# Patient Record
Sex: Female | Born: 1990 | Race: Black or African American | Hispanic: No | Marital: Single | Smoking: Never smoker
Health system: Southern US, Community
[De-identification: ages and names within clinical notes are randomized; demographics above are authoritative.]

## PROBLEM LIST (undated history)

## (undated) DIAGNOSIS — K8020 Calculus of gallbladder without cholecystitis without obstruction: Secondary | ICD-10-CM

## (undated) DIAGNOSIS — E876 Hypokalemia: Secondary | ICD-10-CM

## (undated) HISTORY — PX: CHOLECYSTECTOMY: SHX55

---

## 2014-11-26 ENCOUNTER — Encounter (HOSPITAL_COMMUNITY): Payer: Self-pay

## 2014-11-26 ENCOUNTER — Emergency Department (HOSPITAL_COMMUNITY)
Admission: EM | Admit: 2014-11-26 | Discharge: 2014-11-26 | Disposition: A | Discharge status: Home / Self Care | Payer: Self-pay | Attending: Emergency Medicine | Admitting: Emergency Medicine

## 2014-11-26 DIAGNOSIS — X58XXXA Exposure to other specified factors, initial encounter: Secondary | ICD-10-CM | POA: Insufficient documentation

## 2014-11-26 DIAGNOSIS — Z3202 Encounter for pregnancy test, result negative: Secondary | ICD-10-CM | POA: Insufficient documentation

## 2014-11-26 DIAGNOSIS — Y998 Other external cause status: Secondary | ICD-10-CM | POA: Insufficient documentation

## 2014-11-26 DIAGNOSIS — Z720 Tobacco use: Secondary | ICD-10-CM | POA: Insufficient documentation

## 2014-11-26 DIAGNOSIS — S90822A Blister (nonthermal), left foot, initial encounter: Secondary | ICD-10-CM | POA: Insufficient documentation

## 2014-11-26 DIAGNOSIS — N390 Urinary tract infection, site not specified: Secondary | ICD-10-CM | POA: Insufficient documentation

## 2014-11-26 DIAGNOSIS — E876 Hypokalemia: Secondary | ICD-10-CM | POA: Insufficient documentation

## 2014-11-26 DIAGNOSIS — Y939 Activity, unspecified: Secondary | ICD-10-CM | POA: Insufficient documentation

## 2014-11-26 DIAGNOSIS — Y929 Unspecified place or not applicable: Secondary | ICD-10-CM | POA: Insufficient documentation

## 2014-11-26 DIAGNOSIS — A5903 Trichomonal cystitis and urethritis: Secondary | ICD-10-CM | POA: Insufficient documentation

## 2014-11-26 HISTORY — DX: Hypokalemia: E87.6

## 2014-11-26 LAB — I-STAT CHEM 8, ED
BUN: 6 mg/dL (ref 6–23)
Calcium, Ion: 1.21 mmol/L (ref 1.12–1.23)
Chloride: 102 mmol/L (ref 96–112)
Creatinine, Ser: 0.5 mg/dL (ref 0.50–1.10)
Glucose, Bld: 84 mg/dL (ref 70–99)
HCT: 41 % (ref 36.0–46.0)
Hemoglobin: 13.9 g/dL (ref 12.0–15.0)
Potassium: 3 mmol/L — ABNORMAL LOW (ref 3.5–5.1)
SODIUM: 142 mmol/L (ref 135–145)
TCO2: 26 mmol/L (ref 0–100)

## 2014-11-26 LAB — URINALYSIS, ROUTINE W REFLEX MICROSCOPIC
BILIRUBIN URINE: NEGATIVE
Glucose, UA: NEGATIVE mg/dL
HGB URINE DIPSTICK: NEGATIVE
KETONES UR: NEGATIVE mg/dL
Nitrite: NEGATIVE
Protein, ur: NEGATIVE mg/dL
SPECIFIC GRAVITY, URINE: 1.01 (ref 1.005–1.030)
Urobilinogen, UA: 1 mg/dL (ref 0.0–1.0)
pH: 7.5 (ref 5.0–8.0)

## 2014-11-26 LAB — URINE MICROSCOPIC-ADD ON

## 2014-11-26 LAB — POC URINE PREG, ED: Preg Test, Ur: NEGATIVE

## 2014-11-26 MED ORDER — POTASSIUM CHLORIDE ER 10 MEQ PO TBCR: 10.0000 meq | EXTENDED_RELEASE_TABLET | Freq: Two times a day (BID) | ORAL | Status: DC

## 2014-11-26 MED ORDER — POTASSIUM CHLORIDE CRYS ER 20 MEQ PO TBCR
40.0000 meq | EXTENDED_RELEASE_TABLET | Freq: Once | ORAL | Status: AC
  Administered 2014-11-26: 40 meq via ORAL
  Filled 2014-11-26: qty 2

## 2014-11-26 MED ORDER — CEPHALEXIN 500 MG PO CAPS
500.0000 mg | ORAL_CAPSULE | Freq: Four times a day (QID) | ORAL | Status: DC
Start: 2014-11-26 — End: 2015-07-27

## 2014-11-26 MED ORDER — METRONIDAZOLE 500 MG PO TABS
2000.0000 mg | ORAL_TABLET | Freq: Once | ORAL | Status: AC
  Administered 2014-11-26: 2000 mg via ORAL
  Filled 2014-11-26: qty 4

## 2014-11-26 NOTE — Discharge Instructions (Signed)
Use post op boot when walking.  Recheck with foot doctor next week.Urinary Tract Infection A urinary tract infection (UTI) can occur any place along the urinary tract. The tract includes the kidneys, ureters, bladder, and urethra. A type of germ called bacteria often causes a UTI. UTIs are often helped with antibiotic medicine.  HOME CARE   If given, take antibiotics as told by your doctor. Finish them even if you start to feel better.  Drink enough fluids to keep your pee (urine) clear or pale yellow.  Avoid tea, drinks with caffeine, and bubbly (carbonated) drinks.  Pee often. Avoid holding your pee in for a long time.  Pee before and after having sex (intercourse).  Wipe from front to back after you poop (bowel movement) if you are a woman. Use each tissue only once. GET HELP RIGHT AWAY IF:   You have back pain.  You have lower belly (abdominal) pain.  You have chills.  You feel sick to your stomach (nauseous).  You throw up (vomit).  Your burning or discomfort with peeing does not go away.  You have a fever.  Your symptoms are not better in 3 days. MAKE SURE YOU:   Understand these instructions.  Will watch your condition.  Will get help right away if you are not doing well or get worse. Document Released: 01/15/2008 Document Revised: 04/22/2012 Document Reviewed: 02/27/2012 [facility] Patient Information 2015 [place], [place]. This information is not intended to replace advice given to you by your health care provider. Make sure you discuss any questions you have with your health care provider. Trichomoniasis Trichomoniasis is an infection caused by an organism called Trichomonas. The infection can affect both women and men. In women, the outer female genitalia and the vagina are affected. In men, the penis is mainly affected, but the prostate and other reproductive organs can also be involved. Trichomoniasis is a sexually transmitted infection (STI) and is most often  passed to another person through sexual contact.  RISK FACTORS  Having unprotected sexual intercourse.  Having sexual intercourse with an infected partner. SIGNS AND SYMPTOMS  Symptoms of trichomoniasis in women include:  Abnormal gray-green frothy vaginal discharge.  Itching and irritation of the vagina.  Itching and irritation of the area outside the vagina. Symptoms of trichomoniasis in men include:   Penile discharge with or without pain.  Pain during urination. This results from inflammation of the urethra. DIAGNOSIS  Trichomoniasis may be found during a Pap test or physical exam. Your health care provider may use one of the following methods to help diagnose this infection:  Examining vaginal discharge under a microscope. For men, urethral discharge would be examined.  Testing the pH of the vagina with a test tape.  Using a vaginal swab test that checks for the Trichomonas organism. A test is available that provides results within a few minutes.  Doing a culture test for the organism. This is not usually needed. TREATMENT   You may be given medicine to fight the infection. Women should inform their health care provider if they could be or are pregnant. Some medicines used to treat the infection should not be taken during pregnancy.  Your health care provider may recommend over-the-counter medicines or creams to decrease itching or irritation.  Your sexual partner will need to be treated if infected. HOME CARE INSTRUCTIONS   Take medicines only as directed by your health care provider.  Take over-the-counter medicine for itching or irritation as directed by your health care provider.  Do not have sexual intercourse while you have the infection.  Women should not douche or wear tampons while they have the infection.  Discuss your infection with your partner. Your partner may have gotten the infection from you, or you may have gotten it from your partner.  Have your  sex partner get examined and treated if necessary.  Practice safe, informed, and protected sex.  See your health care provider for other STI testing. SEEK MEDICAL CARE IF:   You still have symptoms after you finish your medicine.  You develop abdominal pain.  You have pain when you urinate.  You have bleeding after sexual intercourse.  You develop a rash.  Your medicine makes you sick or makes you throw up (vomit). MAKE SURE YOU:  Understand these instructions.  Will watch your condition.  Will get help right away if you are not doing well or get worse. Document Released: 01/22/2001 Document Revised: 12/13/2013 Document Reviewed: 05/10/2013 [facility] Patient Information 2015 [place], [place]. This information is not intended to replace advice given to you by your health care provider. Make sure you discuss any questions you have with your health care provider. Hypokalemia Hypokalemia means that the amount of potassium in the blood is lower than normal.Potassium is a chemical, called an electrolyte, that helps regulate the amount of fluid in the body. It also stimulates muscle contraction and helps nerves function properly.Most of the body's potassium is inside of cells, and only a very small amount is in the blood. Because the amount in the blood is so small, minor changes can be life-threatening. CAUSES  Antibiotics.  Diarrhea or vomiting.  Using laxatives too much, which can cause diarrhea.  Chronic kidney disease.  Water pills (diuretics).  Eating disorders (bulimia).  Low magnesium level.  Sweating a lot. SIGNS AND SYMPTOMS  Weakness.  Constipation.  Fatigue.  Muscle cramps.  Mental confusion.  Skipped heartbeats or irregular heartbeat (palpitations).  Tingling or numbness. DIAGNOSIS  Your health care provider can diagnose hypokalemia with blood tests. In addition to checking your potassium level, your health care provider may also check other lab  tests. TREATMENT Hypokalemia can be treated with potassium supplements taken by mouth or adjustments in your current medicines. If your potassium level is very low, you may need to get potassium through a vein (IV) and be monitored in the hospital. A diet high in potassium is also helpful. Foods high in potassium are:  Nuts, such as peanuts and pistachios.  Seeds, such as sunflower seeds and pumpkin seeds.  Peas, lentils, and lima beans.  Whole grain and bran cereals and breads.  Fresh fruit and vegetables, such as apricots, avocado, bananas, cantaloupe, kiwi, oranges, tomatoes, asparagus, and potatoes.  Orange and tomato juices.  Red meats.  Fruit yogurt. HOME CARE INSTRUCTIONS  Take all medicines as prescribed by your health care provider.  Maintain a healthy diet by including nutritious food, such as fruits, vegetables, nuts, whole grains, and lean meats.  If you are taking a laxative, be sure to follow the directions on the label. SEEK MEDICAL CARE IF:  Your weakness gets worse.  You feel your heart pounding or racing.  You are vomiting or having diarrhea.  You are diabetic and having trouble keeping your blood glucose in the normal range. SEEK IMMEDIATE MEDICAL CARE IF:  You have chest pain, shortness of breath, or dizziness.  You are vomiting or having diarrhea for more than 2 days.  You faint. MAKE SURE YOU:   Understand these  instructions.  Will watch your condition.  Will get help right away if you are not doing well or get worse. Document Released: 07/29/2005 Document Revised: 05/19/2013 Document Reviewed: 01/29/2013 [facility] Patient Information 2015 [place]. This information is not intended to replace advice given to you by your health care provider. Make sure you discuss any questions you have with your health care provider.

## 2014-11-26 NOTE — ED Notes (Addendum)
Pt states has been short of breath while working and dizziness for weeks-- intermittently-- "when I am overdoing it" . Pt states has not been eating good lately-- has lost weight in past month-- 3 pounds.

## 2014-11-26 NOTE — ED Provider Notes (Signed)
CSN: [id number]     Arrival date & time 11/26/14  1335 History   First MD Initiated Contact with Patient 11/26/14 1413     Chief Complaint  Patient presents with  . Urinary Frequency  . Foot Pain     (Consider location/radiation/quality/duration/timing/severity/associated sxs/prior Treatment) HPI 24 year old female who comes in today complaining of pain on the bottom of her left foot. She states there's been some swelling and has gradually gotten larger over the past year. She is having pain when she stands on it. She did not have any injury or wound to this area. She may have noted some discharge from the area several months ago but has not noted any recently. She's not had any similar symptoms in the past. Nursing notes state that she has been short of breath while working but she denies this to me. She has had some increased frequency of urination and urinalysis has been pending. Any local primary care. She states that she needs a note for work as she missed work last night. Past Medical History  Diagnosis Date  . Low blood potassium    Past Surgical History  Procedure Laterality Date  . Cholecystectomy     History reviewed. No pertinent family history. History  Substance Use Topics  . Smoking status: Current Every Day Smoker -- 1.00 packs/day    Types: Cigarettes  . Smokeless tobacco: Not on file  . Alcohol Use: No   OB History    No data available     Review of Systems  Constitutional: Negative for appetite change.  HENT: Negative.   Eyes: Negative.   Respiratory: Negative.   Cardiovascular: Negative.   Gastrointestinal: Negative.   Endocrine: Negative.   Genitourinary: Positive for frequency.  Musculoskeletal: Positive for gait problem.  Neurological: Negative.   Hematological: Negative.   Psychiatric/Behavioral: Negative.   All other systems reviewed and are negative.     Allergies  Benadryl  Home Medications   Prior to Admission medications   Not on  File   BP 97/61 mmHg  Pulse 80  Temp(Src) 98.4 F (36.9 C) (Oral)  Resp 16  Ht 5' (1.524 m)  Wt 92 lb 12.8 oz (42.094 kg)  BMI 18.12 kg/m2  SpO2 100%  LMP 11/20/2014 Physical Exam  Constitutional: She appears well-developed and well-nourished.  HENT:  Head: Normocephalic and atraumatic.  Neck: Normal range of motion. Neck supple.  Pulmonary/Chest: Effort normal.  Musculoskeletal: Normal range of motion. She exhibits tenderness. She exhibits no edema.       Feet:  Patient with 3x3 cm area swelling plantar surface left foot.   Nursing note and vitals reviewed.   ED Course  Procedures (including critical care time) Labs Review Labs Reviewed  URINALYSIS, ROUTINE W REFLEX MICROSCOPIC - Abnormal; Notable for the following:    APPearance CLOUDY (*)    Leukocytes, UA LARGE (*)    All other components within normal limits  URINE MICROSCOPIC-ADD ON - Abnormal; Notable for the following:    Squamous Epithelial / LPF FEW (*)    Bacteria, UA FEW (*)    All other components within normal limits  I-STAT CHEM 8, ED - Abnormal; Notable for the following:    Potassium 3.0 (*)    All other components within normal limits  POC URINE PREG, ED    I  MDM   Final diagnoses:  Blister of plantar aspect of foot, left, initial encounter  Hypokalemia  UTI (lower urinary tract infection)  Trichomonal cystitis  Left foot swelling- very gradual, doubt fb or abscess.  Possibly plantar wart.  Plan referral to podiatry.   Hypokalemia- oral repletion ordered here in ed and will give rx with referral for op f/u  3- uti- plan abx and will also treat trich.     Margarita Grizzleanielle Demarkis Gheen, MD 11/27/14 [phone]

## 2014-11-26 NOTE — ED Notes (Addendum)
Onset several days increased urination, shortness of breath and dizziness.   Pt reports gets these symptoms  when potassium low.  No dizziness or shortness of breath at this time.   Onset 2 years lump on bottom of left foot, to date has not had evaluated. NAD.

## 2015-01-20 ENCOUNTER — Encounter (HOSPITAL_COMMUNITY): Payer: Self-pay

## 2015-01-20 ENCOUNTER — Emergency Department (HOSPITAL_BASED_OUTPATIENT_CLINIC_OR_DEPARTMENT_OTHER): Payer: Self-pay

## 2015-01-20 ENCOUNTER — Emergency Department (HOSPITAL_COMMUNITY)
Admission: EM | Admit: 2015-01-20 | Discharge: 2015-01-20 | Disposition: A | Discharge status: Home / Self Care | Payer: Self-pay | Attending: Emergency Medicine | Admitting: Emergency Medicine

## 2015-01-20 DIAGNOSIS — M25561 Pain in right knee: Secondary | ICD-10-CM

## 2015-01-20 DIAGNOSIS — L0291 Cutaneous abscess, unspecified: Secondary | ICD-10-CM

## 2015-01-20 DIAGNOSIS — M79609 Pain in unspecified limb: Secondary | ICD-10-CM

## 2015-01-20 DIAGNOSIS — L02415 Cutaneous abscess of right lower limb: Secondary | ICD-10-CM | POA: Insufficient documentation

## 2015-01-20 DIAGNOSIS — Z792 Long term (current) use of antibiotics: Secondary | ICD-10-CM | POA: Insufficient documentation

## 2015-01-20 DIAGNOSIS — Z8639 Personal history of other endocrine, nutritional and metabolic disease: Secondary | ICD-10-CM | POA: Insufficient documentation

## 2015-01-20 DIAGNOSIS — Z720 Tobacco use: Secondary | ICD-10-CM | POA: Insufficient documentation

## 2015-01-20 LAB — BASIC METABOLIC PANEL
Anion gap: 10 (ref 5–15)
BUN: <5 mg/dL — ABNORMAL LOW (ref 6–20)
CO2: 23 mmol/L (ref 22–32)
Calcium: 8.9 mg/dL (ref 8.9–10.3)
Chloride: 104 mmol/L (ref 101–111)
Creatinine, Ser: 0.39 mg/dL — ABNORMAL LOW (ref 0.44–1.00)
GFR calc Af Amer: >60 mL/min (ref >60)
GFR calc non Af Amer: >60 mL/min (ref >60)
Glucose, Bld: 96 mg/dL (ref 65–99)
Potassium: 2.5 mmol/L — CL (ref 3.5–5.1)
Sodium: 137 mmol/L (ref 135–145)

## 2015-01-20 LAB — CBC WITH DIFFERENTIAL/PLATELET
BASOS ABS: 0.1 10*3/uL (ref 0.0–0.1)
BASOS PCT: 1 % (ref 0–1)
EOS PCT: 1 % (ref 0–5)
Eosinophils Absolute: 0 10*3/uL (ref 0.0–0.7)
HEMATOCRIT: 34 % — AB (ref 36.0–46.0)
Hemoglobin: 12 g/dL (ref 12.0–15.0)
Lymphocytes Relative: 30 % (ref 12–46)
Lymphs Abs: 2.2 10*3/uL (ref 0.7–4.0)
MCH: 27.6 pg (ref 26.0–34.0)
MCHC: 35.3 g/dL (ref 30.0–36.0)
MCV: 78.2 fL (ref 78.0–100.0)
MONOS PCT: 8 % (ref 3–12)
Monocytes Absolute: 0.6 10*3/uL (ref 0.1–1.0)
NEUTROS ABS: 4.3 10*3/uL (ref 1.7–7.7)
Neutrophils Relative %: 60 % (ref 43–77)
PLATELETS: 271 10*3/uL (ref 150–400)
RBC: 4.35 MIL/uL (ref 3.87–5.11)
RDW: 14.7 % (ref 11.5–15.5)
WBC: 7.2 10*3/uL (ref 4.0–10.5)

## 2015-01-20 MED ORDER — POTASSIUM CHLORIDE CRYS ER 20 MEQ PO TBCR
20.0000 meq | EXTENDED_RELEASE_TABLET | Freq: Once | ORAL | Status: AC
  Administered 2015-01-20: 20 meq via ORAL
  Filled 2015-01-20: qty 1

## 2015-01-20 MED ORDER — DOXYCYCLINE HYCLATE 100 MG PO CAPS: 100.0000 mg | ORAL_CAPSULE | Freq: Two times a day (BID) | ORAL | Status: DC

## 2015-01-20 MED ORDER — KETOROLAC TROMETHAMINE 30 MG/ML IJ SOLN
30.0000 mg | Freq: Once | INTRAMUSCULAR | Status: AC
  Administered 2015-01-20: 30 mg via INTRAVENOUS
  Filled 2015-01-20: qty 1

## 2015-01-20 MED ORDER — POTASSIUM CHLORIDE ER 10 MEQ PO TBCR: 10.0000 meq | EXTENDED_RELEASE_TABLET | Freq: Every day | ORAL | Status: DC

## 2015-01-20 MED ORDER — NAPROXEN 500 MG PO TABS
500.0000 mg | ORAL_TABLET | Freq: Two times a day (BID) | ORAL | Status: DC
Start: 2015-01-20 — End: 2017-10-13

## 2015-01-20 NOTE — ED Notes (Signed)
Pt complaining of a "keloid behind knee." Pt states "I've had it all my life but have never had any trouble with it." Complaining of increased pain over last few days. Pt ambulatory.

## 2015-01-20 NOTE — Discharge Instructions (Signed)
Abscess Take anabiotic's as prescribed. Please return for fever, surrounding redness, no improvement in 48 hours. Follow-up with podiatry for skin lesion on the bottom of your right foot.  An abscess (boil or furuncle) is an infected area on or under the skin. This area is filled with yellowish-white fluid (pus) and other material (debris). HOME CARE   Only take medicines as told by your doctor.  If you were given antibiotic medicine, take it as directed. Finish the medicine even if you start to feel better.  If gauze is used, follow your doctor's directions for changing the gauze.  To avoid spreading the infection:  Keep your abscess covered with a bandage.  Wash your hands well.  Do not share personal care items, towels, or whirlpools with others.  Avoid skin contact with others.  Keep your skin and clothes clean around the abscess.  Keep all doctor visits as told. GET HELP RIGHT AWAY IF:   You have more pain, puffiness (swelling), or redness in the wound site.  You have more fluid or blood coming from the wound site.  You have muscle aches, chills, or you feel sick.  You have a fever. MAKE SURE YOU:   Understand these instructions.  Will watch your condition.  Will get help right away if you are not doing well or get worse. Document Released: 01/15/2008 Document Revised: 01/28/2012 Document Reviewed: 10/11/2011 [facility] Patient Information 2015 [place], [place]. This information is not intended to replace advice given to you by your health care provider. Make sure you discuss any questions you have with your health care provider.  Emergency Department Resource Guide 1) Find a Doctor and Pay Out of Pocket Although you won't have to find out who is covered by your insurance plan, it is a good idea to ask around and get recommendations. You will then need to call the office and see if the doctor you have chosen will accept you as a new patient and what types of options  they offer for patients who are self-pay. Some doctors offer discounts or will set up payment plans for their patients who do not have insurance, but you will need to ask so you aren't surprised when you get to your appointment.  2) Contact Your Local Health Department Not all health departments have doctors that can see patients for sick visits, but many do, so it is worth a call to see if yours does. If you don't know where your local health department is, you can check in your phone book. The CDC also has a tool to help you locate your state's health department, and many state websites also have listings of all of their local health departments.  3) Find a Walk-in Clinic If your illness is not likely to be very severe or complicated, you may want to try a walk in clinic. These are popping up all over the country in pharmacies, drugstores, and shopping centers. They're usually staffed by nurse practitioners or physician assistants that have been trained to treat common illnesses and complaints. They're usually fairly quick and inexpensive. However, if you have serious medical issues or chronic medical problems, these are probably not your best option.  No Primary Care Doctor: - Call Health Connect at  [phone] - they can help you locate a primary care doctor that  accepts your insurance, provides certain services, etc. - Physician Referral Service- [phone]  Chronic Pain Problems: Organization         Address  Phone  Notes  [facility]  [phone] Patients need to be referred by their primary care doctor.   Medication Assistance: Organization         Address  Phone   Notes  [facility] Medication [facility] [address] [place] [place], [place] [address] [phone] --Must be a resident of [facility] -- Must have NO insurance coverage whatsoever (no Medicaid/ Medicare, etc.) -- The pt. MUST have a primary care doctor that directs their  care regularly and follows them in the community   [facility]ist  [phone]   [employer]  [phone]    Agencies that provide inexpensive medical care: Organization         Address  Phone   Notes  [facility]  [phone]   [facility]    [phone]   [facility] [address] [place], [place] [address] [phone]   [facility] [address] [place] [place] [phone]   Planned Parenthood    [phone]   [facility]    [phone]   Community Health and [facility]  [address], [place] Phone:  [phone], Fax:  [phone] Hours of Operation:  9 am - 6 pm, M-F.  Also accepts Medicaid/Medicare and self-pay.  [facility]  [address], [place] Phone: [phone], Fax: [phone]. Hours of Operation:  8:30 am - 5:30 pm, M-F.  Also accepts Medicaid and self-pay.  [facility] [place] [address] [place] Phone: [phone]   [facility] [address] Natasha Bence [place], [place] [phone], Ext. 123 Mondays & Thursdays: 7-9 AM.  First 15 patients are seen on a first come, first serve basis.    Medicaid-accepting [place] Providers:  Organization         Address  Phone   Notes  [facility] [address],  [phone] Also accepts self-pay patients.  [facility] [address] Laurell Josephs [place], [place]  [phone]   [facility] [address], [place] [phone]   [facility] [address] [place] [phone]   Renaye Rakers [address], [place]   [phone] Only accepts [place] Access [place] patients after they have their name applied to their card.   Self-Pay (no insurance) in [facility]:  Organization         Address  Phone    Notes  Sickle Cell Patients, [facility] [address] [place], [place] [phone]   [facility] [address] [place], [place] [phone]   [facility] [place]  [address], [place] [phone]   [facility]/Dr. Osei-Bonsu  [address], [place] or [address], [place] [phone] Phone number for both [place] and [place] locations is the same.  Urgent Medical and [facility] [address], [place] [phone]   [facility] [address] [place] or [address] [phone]   [facility] [address] [place] [phone], phone; [phone], fax Sees patients 1st and 3rd Saturday of every month.  Must not qualify for public or private insurance (  i.e. Medicaid, Medicare, [place] Health Choice, Veterans' Benefits)  Household income should be no more than 200% of the poverty level The clinic cannot treat you if you are pregnant or think you are pregnant  Sexually transmitted diseases are not treated at the clinic.    Dental Care: Organization         Address  Phone  Notes  [facility] Department of [facility] [address] [place], [place] [phone] Accepts children up to age 53 who are enrolled in [place] or [place] Health Choice; pregnant women with a Medicaid card; and children who have applied for Medicaid or [place] Health Choice, but were declined, whose parents can pay a reduced fee at time of service.  [facility] of [facility]  [address], [place] [phone] Accepts children up to age 107 who are enrolled in [place] or [place] Health Choice; pregnant women with a Medicaid card; and children who have applied for Medicaid or [place] Health Choice, but were declined, whose parents can pay a reduced fee at time of service.  [facility]  [address] [place], [place] [phone] Patients are seen by appointment only. Walk-ins are not accepted. [facility] will see patients 45 years of age and older. Monday - Tuesday (8am-5pm) Most Wednesdays (8:30-5pm) $30 per visit, cash only  [facility] Adult [facility] Access [facility]  [address], [facility] [phone] Patients are seen by appointment only. Walk-ins are not accepted. [facility] will see patients 20 years of age and older. One Wednesday Evening (Monthly: Volunteer Based).  $30 per visit, cash only  [employer]  [phone] for adults; Children under age 103, call Graduate Pediatric Dentistry at [phone]. Children aged 74-14, please call [phone] to request a pediatric application.  Dental services are provided in all areas of dental care including fillings, crowns and bridges, complete and partial dentures, implants, gum treatment, root canals, and extractions. Preventive care is also provided. Treatment is provided to both adults and children. Patients are selected via a lottery and there is often a waiting list.   [facility] [address], [place]  [phone] [email or web address]   [facility] [address] [place], [place] [phone], Ext. [phone] Second and Fourth Thursday of each month, opens at 6:30 AM; Clinic ends at 9 AM.  Patients are seen on a first-come first-served basis, and a limited number are seen during each clinic.   [facility]  [address] Ether Griffins [place], [place] [phone]   Eligibility Requirements You must have lived in [place], [place], or [place] counties for at least the last three months.   You cannot be eligible for state or federal sponsored [place], including [employer], [place], or [employer].   You generally cannot be eligible for healthcare insurance through your employer.    How to  apply: Eligibility screenings are held every Tuesday and Wednesday afternoon from 1:00 pm until 4:00 pm. You do not need an appointment for the interview!  [facility] [address] [place], [place] [phone]   [facility] Health Department  [phone]   [facility] Health Department  [phone]   [facility] Health Department  [phone]    Behavioral Health Resources in the Community: Intensive Outpatient Programs Organization         Address  Phone  Notes  [facility] [address] [place], [place] [phone]   [facility]patient [address] [place], [place] [phone]   [facility] [address], [place], [place]  [phone]   [facility] Mental Health [address],  [place], [place] [phone] or [phone]   Substance Abuse Resources Organization         Address  Phone  Notes  [facility] Drug Services  [phone]   [facility]  [phone]   [facility] [place]  [phone]   Floydene Flock  [phone]   Residential & Outpatient Substance Abuse Program  [phone]   Psychological Services Organization         Address  Phone  Notes  [facility] Behavioral Health  [phone]   [facility] Services  [phone]   [facility] Mental Health [address] [place] [phone] or [phone]    Mobile Crisis Teams Organization         Address  Phone  Notes  [facility], Mobile Crisis Care Unit  [phone]   [facility]  [address] [place], [place] [phone]   Doristine Locks [address] [place] [phone]    Self-Help/Support Groups Organization         Address  Phone             Notes  [facility] [place] - variety of support groups  336- I7437963 Call for more information  Narcotics Anonymous (NA), [facility] [address], [employer] [place]  2 meetings at this location   Statistician         Address  Phone  Notes  [facility] [address],    [place]  [phone]   [facility]  [address] [place] [address]242, [place], [place] [phone]   [facility] Treatment Facility [address] [place], [place] [phone] Admissions: 8am-3pm M-F  [facility] [address]    [place], [place] [phone]   [facility] [address] [place], [place], [place] [phone]   [facility] [address]  [place], [place] [phone]   [facility] - Intensive Outpatient [address] Laurell Josephs [address], [place], [place] [phone]   [facility] (Addiction Recovery [facility] [address] [place]  [place], [place] [phone] or [phone]   [facility] ([facility]) [address], [place], [place] [phone] Accepts Medicaid  [facility] [place] [address]  [place] [phone] Substance Abuse/Addiction Treatment   [facility] Organization         Address  Phone  Notes  [facility]  [phone])[phone]   Angie Fava, PhD [address] Ervin Knack [place], [place]   [phone] or [phone]   [facility]itional [facility] Rehabilitation Behavioral   [address] [place], [place] [phone]   [facility] [address] [place], [place] [phone] Insurance/Medicaid/sponsorship through [facility] Families [address]                                    [place], [place] [phone] Therapy/tele-psych/case  [facility] [address] [place], [place] [phone]    Dr. Lolly Mustache  [phone]   [facility] [place]  [employer] [facility] Dept. 1)  [address], [place] 2) [address], [place] 3)  [address], [place] [phone](336) [phone]) [phone]  [phone]) [phone]   [facility] Child Abuse Hotline  [phone](336) [phone] or [phone](336) [phone] (After Hours)

## 2015-01-20 NOTE — Progress Notes (Signed)
VASCULAR LAB PRELIMINARY  PRELIMINARY  PRELIMINARY  PRELIMINARY  Right lower extremity venous duplex completed.    Preliminary report:  Right:  No evidence of DVT, superficial thrombosis, or Baker's cyst.  Sara Hill, RVS 01/20/2015, 7:24 PM

## 2015-01-20 NOTE — ED Provider Notes (Signed)
CSN: [id number]     Arrival date & time 01/20/15  1657 History  This chart was scribed for Sara Gosselin, PA-C working with Tilden Fossa, MD by Elveria Rising, ED Scribe. This patient was seen in room TR07C/TR07C and the patient's care was started at 5:11 PM.   Chief Complaint  Patient presents with  . Mass   The history is provided by the patient. No language interpreter was used.   HPI Comments: Sara Hill is a 24 y.o. female who presents to the Emergency Department complaining of painful keloid scarring and mass located to popliteal region of her right knee. Patient reports that the scar has been present her entire life but just recently became painful 2-3 days ago. Patient uncertain of specific injury in her childhood that caused the scar; states she was too young to remember. Patient states that she does not typically monitor the scar because it has never bothered her. Patient reports pain with stretching and bending 2-3 ago and reports then noticing substantial growth at the site. Patient also locates keloid scarring to the plantar surface/heel of her left foot. Patient attributes the lesion to prolonged walking and tingling/numbness at the site. Patient is ambulatory but has been walking on her tip toes to avoid fully extending her left knee and fully planting her left foot. Patient shares that she was previously evaluated for the scarring to her heel and referred to a podiatrist to have it removed. Patient denies fever, chills.     Past Medical History  Diagnosis Date  . Low blood potassium    Past Surgical History  Procedure Laterality Date  . Cholecystectomy     History reviewed. No pertinent family history. History  Substance Use Topics  . Smoking status: Current Every Day Smoker -- 1.00 packs/day    Types: Cigarettes  . Smokeless tobacco: Not on file  . Alcohol Use: No   OB History    No data available     Review of Systems  Constitutional: Negative for  fever and chills.  Musculoskeletal: Negative for joint swelling and gait problem.  Skin: Negative for rash.    Allergies  Benadryl  Home Medications   Prior to Admission medications   Medication Sig Start Date End Date Taking? Authorizing Provider  cephALEXin (KEFLEX) 500 MG capsule Take 1 capsule (500 mg total) by mouth 4 (four) times daily. 11/26/14   Margarita Grizzle, MD  naproxen (NAPROSYN) 500 MG tablet Take 1 tablet (500 mg total) by mouth 2 (two) times daily. 01/20/15   Tyrell Seifer Patel-Mills, PA-C  potassium chloride (K-DUR) 10 MEQ tablet Take 1 tablet (10 mEq total) by mouth daily. 01/20/15   Sara Gosselin, PA-C   Triage Vitals: BP 103/63 mmHg  Pulse 86  Temp(Src) 97.1 F (36.2 C) (Oral)  Resp 16  SpO2 100% Physical Exam  Constitutional: She is oriented to person, place, and time. She appears well-developed and well-nourished. No distress.  HENT:  Head: Normocephalic and atraumatic.  Eyes: EOM are normal.  Neck: Neck supple. No tracheal deviation present.  Cardiovascular: Normal rate.   Pulmonary/Chest: Effort normal. No respiratory distress.  Musculoskeletal: Normal range of motion.  Neurological: She is alert and oriented to person, place, and time.  Skin: Skin is warm and dry. No erythema.  3 x 3 cm left plantar foot with raised and  hyperpigmented area.  2 large keloids to the left popliteal fossa. One is medial and the other is in the middle of the popliteal fossa. The middle keloid  is tender, warm to the touch. No drainage from the site. No surrounding cellulitis or erythema.  Psychiatric: She has a normal mood and affect. Her behavior is normal.  Nursing note and vitals reviewed.   ED Course  Procedures (including critical care time)  COORDINATION OF CARE: 5:28 PM- Plans to consult attending. Discussed treatment plan with patient at bedside and patient agreed to plan.   Labs Review Labs Reviewed  CBC WITH DIFFERENTIAL/PLATELET - Abnormal; Notable for the  following:    HCT 34.0 (*)    All other components within normal limits  BASIC METABOLIC PANEL - Abnormal; Notable for the following:    Potassium 2.5 (*)    BUN <5 (*)    Creatinine, Ser 0.39 (*)    All other components within normal limits    Imaging Review No results found.   EKG Interpretation None      MDM   Final diagnoses:  Posterior knee pain, right  Abscess  Patient presents for left popliteal fossa keloid pain. She also states that she has a left plantar area of skin that irritates her especially after walking. Patient was seen for plantar wound in April 2016 and referred to podiatry. The wound does not seem any bigger then was first evaluated in April. Patient is currently afebrile with no leukocytosis. She had an incidental finding of hypokalemia. She has been hypokalemic in the past and was prescribed potassium during her last visit in April. She states that she did not get the prescription filled. I prescribed potassium for her, discussed the importance of taking this medication, and gave her instructions to follow up with the provider using the resource guide. The Doppler of her right lower extremity is negative for DVT, superficial thrombosis, Baker cyst. I suspect this may be the beginning of an abscess formation. I have given her strict return precautions such as fever, surrounding erythema, no improvement in 48 hours. I prescribed doxycycline to cover for MRSA. I also prescribed naproxen for pain. I personally performed the services described in this documentation, which was scribed in my presence. The recorded information has been reviewed and is accurate.    Sara Gosselin, PA-C 01/20/15 1950  Tilden Fossa, MD 01/21/15 0021

## 2015-01-25 ENCOUNTER — Telehealth: Payer: Self-pay | Admitting: *Deleted

## 2015-01-25 ENCOUNTER — Emergency Department (HOSPITAL_COMMUNITY)
Admission: EM | Admit: 2015-01-25 | Discharge: 2015-01-25 | Disposition: A | Discharge status: Home / Self Care | Payer: Self-pay | Attending: Emergency Medicine | Admitting: Emergency Medicine

## 2015-01-25 ENCOUNTER — Encounter (HOSPITAL_COMMUNITY): Payer: Self-pay | Admitting: *Deleted

## 2015-01-25 DIAGNOSIS — R197 Diarrhea, unspecified: Secondary | ICD-10-CM

## 2015-01-25 DIAGNOSIS — Z8719 Personal history of other diseases of the digestive system: Secondary | ICD-10-CM | POA: Insufficient documentation

## 2015-01-25 DIAGNOSIS — R110 Nausea: Secondary | ICD-10-CM

## 2015-01-25 DIAGNOSIS — Z720 Tobacco use: Secondary | ICD-10-CM | POA: Insufficient documentation

## 2015-01-25 DIAGNOSIS — R531 Weakness: Secondary | ICD-10-CM

## 2015-01-25 DIAGNOSIS — R630 Anorexia: Secondary | ICD-10-CM | POA: Insufficient documentation

## 2015-01-25 DIAGNOSIS — E876 Hypokalemia: Secondary | ICD-10-CM

## 2015-01-25 DIAGNOSIS — Z79899 Other long term (current) drug therapy: Secondary | ICD-10-CM | POA: Insufficient documentation

## 2015-01-25 HISTORY — DX: Calculus of gallbladder without cholecystitis without obstruction: K80.20

## 2015-01-25 LAB — BASIC METABOLIC PANEL
ANION GAP: 8 (ref 5–15)
BUN: <5 mg/dL — ABNORMAL LOW (ref 6–20)
CO2: 25 mmol/L (ref 22–32)
CREATININE: 0.36 mg/dL — AB (ref 0.44–1.00)
Calcium: 9.2 mg/dL (ref 8.9–10.3)
Chloride: 106 mmol/L (ref 101–111)
GFR calc Af Amer: >60 mL/min (ref >60)
GFR calc non Af Amer: >60 mL/min (ref >60)
Glucose, Bld: 102 mg/dL — ABNORMAL HIGH (ref 65–99)
Potassium: 2.8 mmol/L — ABNORMAL LOW (ref 3.5–5.1)
SODIUM: 139 mmol/L (ref 135–145)

## 2015-01-25 LAB — CBC WITH DIFFERENTIAL/PLATELET
Basophils Absolute: 0.1 10*3/uL (ref 0.0–0.1)
Basophils Relative: 1 % (ref 0–1)
EOS ABS: 0.1 10*3/uL (ref 0.0–0.7)
Eosinophils Relative: 1 % (ref 0–5)
HCT: 35.8 % — ABNORMAL LOW (ref 36.0–46.0)
Hemoglobin: 13.1 g/dL (ref 12.0–15.0)
Lymphocytes Relative: 24 % (ref 12–46)
Lymphs Abs: 1.7 10*3/uL (ref 0.7–4.0)
MCH: 29 pg (ref 26.0–34.0)
MCHC: 36.6 g/dL — AB (ref 30.0–36.0)
MCV: 79.2 fL (ref 78.0–100.0)
MONO ABS: 0.6 10*3/uL (ref 0.1–1.0)
Monocytes Relative: 8 % (ref 3–12)
NEUTROS PCT: 66 % (ref 43–77)
Neutro Abs: 4.5 10*3/uL (ref 1.7–7.7)
Platelets: 278 10*3/uL (ref 150–400)
RBC: 4.52 MIL/uL (ref 3.87–5.11)
RDW: 15.1 % (ref 11.5–15.5)
WBC: 7 10*3/uL (ref 4.0–10.5)

## 2015-01-25 MED ORDER — SODIUM CHLORIDE 0.9 % IV BOLUS (SEPSIS)
1000.0000 mL | Freq: Once | INTRAVENOUS | Status: AC
  Administered 2015-01-25: 1000 mL via INTRAVENOUS

## 2015-01-25 MED ORDER — POTASSIUM CHLORIDE CRYS ER 20 MEQ PO TBCR
20.0000 meq | EXTENDED_RELEASE_TABLET | Freq: Once | ORAL | Status: AC
  Administered 2015-01-25: 20 meq via ORAL
  Filled 2015-01-25: qty 1

## 2015-01-25 NOTE — ED Notes (Signed)
NAD at this time. Pt will speak to case manager and leave with sister.

## 2015-01-25 NOTE — ED Provider Notes (Signed)
CSN: [id number]     Arrival date & time 01/25/15  1027 History   First MD Initiated Contact with Patient 01/25/15 1030     Chief Complaint  Patient presents with  . Shortness of Breath     (Consider location/radiation/quality/duration/timing/severity/associated sxs/prior Treatment) Patient is a 24 y.o. female presenting with shortness of breath. The history is provided by the patient. No language interpreter was used.  Shortness of Breath Associated symptoms: no fever and no headaches   Ms. Lundell is a 24 y.o female with a history of hypokalemia and anorexia who presents by EMS for weakness, nausea, and shortness of breath when she woke up this morning. She states she had several episodes of diarrhea yesterday. She states that she has been drinking fluids but there are several days that she does not eat anything. She states this has happened to her several times in the past and has been evaluated in [place]. She states they told her that it was usually due to vasovagal episodes. She states her symptoms have since resolved. She denies any fever, chills, chest pain, shortness of breath, cough, wheezing, abdominal pain, vomiting, constipation, dysuria, hematuria, urinary frequency, vaginal discharge, vaginal bleeding. She denies any fall or injury.  Past Medical History  Diagnosis Date  . Low blood potassium   . Gall stones    Past Surgical History  Procedure Laterality Date  . Cholecystectomy     No family history on file. History  Substance Use Topics  . Smoking status: Light Tobacco Smoker -- 1.00 packs/day    Types: Cigarettes  . Smokeless tobacco: Not on file  . Alcohol Use: No   OB History    No data available     Review of Systems  Constitutional: Negative for fever.  Respiratory: Positive for shortness of breath.   Neurological: Positive for weakness. Negative for dizziness, numbness and headaches.  All other systems reviewed and are negative.     Allergies   Benadryl  Home Medications   Prior to Admission medications   Medication Sig Start Date End Date Taking? Authorizing Provider  acetaminophen (TYLENOL) 325 MG tablet Take 650 mg by mouth every 6 (six) hours as needed for headache (cramps).   Yes Historical Provider, MD  potassium chloride (K-DUR) 10 MEQ tablet Take 1 tablet (10 mEq total) by mouth daily. Patient taking differently: Take 10 mEq by mouth 2 (two) times daily.  01/20/15  Yes Duell Holdren Patel-Mills, PA-C  cephALEXin (KEFLEX) 500 MG capsule Take 1 capsule (500 mg total) by mouth 4 (four) times daily. Patient not taking: Reported on 01/25/2015 11/26/14   Margarita Grizzle, MD  doxycycline (VIBRAMYCIN) 100 MG capsule Take 1 capsule (100 mg total) by mouth 2 (two) times daily. Patient not taking: Reported on 01/25/2015 01/20/15   Catha Gosselin, PA-C  naproxen (NAPROSYN) 500 MG tablet Take 1 tablet (500 mg total) by mouth 2 (two) times daily. Patient not taking: Reported on 01/25/2015 01/20/15   Akeylah Hendel Patel-Mills, PA-C   BP 104/71 mmHg  Pulse 70  Temp(Src) 97.9 F (36.6 C) (Oral)  Resp 15  Ht 5' (1.524 m)  Wt 91 lb (41.277 kg)  BMI 17.77 kg/m2  SpO2 100% Physical Exam  Constitutional: She is oriented to person, place, and time. She appears well-developed.  Non-toxic appearance. She does not have a sickly appearance.  Thin appearing.  HENT:  Head: Normocephalic and atraumatic.  Eyes: Conjunctivae are normal.  Neck: Normal range of motion. Neck supple.  Cardiovascular: Normal rate, regular rhythm and  normal heart sounds.   Pulmonary/Chest: Effort normal and breath sounds normal. No respiratory distress. She has no wheezes. She has no rales.  Abdominal: Soft. There is no tenderness.  Musculoskeletal: Normal range of motion. She exhibits no edema.  Neurological: She is alert and oriented to person, place, and time.  Skin: Skin is warm and dry.  Nursing note and vitals reviewed.   ED Course  Procedures (including critical care  time) Labs Review Labs Reviewed  CBC WITH DIFFERENTIAL/PLATELET - Abnormal; Notable for the following:    HCT 35.8 (*)    MCHC 36.6 (*)    All other components within normal limits  BASIC METABOLIC PANEL - Abnormal; Notable for the following:    Potassium 2.8 (*)    Glucose, Bld 102 (*)    BUN <5 (*)    Creatinine, Ser 0.36 (*)    All other components within normal limits    Imaging Review No results found.   EKG Interpretation   Date/Time:  Wednesday January 25 2015 10:30:26 EDT Ventricular Rate:  72 PR Interval:  170 QRS Duration: 114 QT Interval:  417 QTC Calculation: 456 R Axis:   82 Text Interpretation:  Sinus rhythm Incomplete right bundle branch block No  old tracing to compare Confirmed by [facility]  MD, ELLIOTT ([id number]) on  01/25/2015 10:58:12 AM      MDM   Final diagnoses:  Hypokalemia  Weakness  Nausea  Diarrhea  Patient presents for weakness, nausea, shortness of breath that began upon awakening this morning. She had several episodes of diarrhea yesterday. She states she has not eaten much in the past few days. She has a history of anorexia. She denies vomiting or taking laxatives.  She states that she does not feel hungry and goes days without eating. She is aware that she has hypokalemia states she cannot afford her medications. Patient's vital signs are stable. Her labs are not concerning. I consulted case management regarding her financial difficulties and not being able to afford her medications.   Patient has new right bundle branch block which I have explained to her. She will need to follow up with a primary care physician using the resource guide. I also explained the importance of taking potassium, eating, and staying hydrated.       Catha Gosselin, PA-C 01/25/15 1817  Mancel Bale, MD 01/26/15 1538

## 2015-01-25 NOTE — ED Notes (Signed)
Pt was seen here on Saturday for low K+. Pt was given a prescription but hasnt had money to fill it.  Pt came back today for SOB and complaining that she thinks her K+ is still low.

## 2015-01-25 NOTE — Discharge Instructions (Signed)
Diarrhea Stay well-hydrated. Eat daily. Diarrhea is watery poop (stool). It can make you feel weak, tired, thirsty, or give you a dry mouth (signs of dehydration). Watery poop is a sign of another problem, most often an infection. It often lasts 2-3 days. It can last longer if it is a sign of something serious. Take care of yourself as told by your doctor. HOME CARE   Drink 1 cup (8 ounces) of fluid each time you have watery poop.  Do not drink the following fluids:  Those that contain simple sugars (fructose, glucose, galactose, lactose, sucrose, maltose).  Sports drinks.  Fruit juices.  Whole milk products.  Sodas.  Drinks with caffeine (coffee, tea, soda) or alcohol.  Oral rehydration solution may be used if the doctor says it is okay. You may make your own solution. Follow this recipe:   - teaspoon table salt.   teaspoon baking soda.   teaspoon salt substitute containing potassium chloride.  1 tablespoons sugar.  1 liter (34 ounces) of water.  Avoid the following foods:  High fiber foods, such as raw fruits and vegetables.  Nuts, seeds, and whole grain breads and cereals.   Those that are sweetened with sugar alcohols (xylitol, sorbitol, mannitol).  Try eating the following foods:  Starchy foods, such as rice, toast, pasta, low-sugar cereal, oatmeal, baked potatoes, crackers, and bagels.  Bananas.  Applesauce.  Eat probiotic-rich foods, such as yogurt and milk products that are fermented.  Wash your hands well after each time you have watery poop.  Only take medicine as told by your doctor.  Take a warm bath to help lessen burning or pain from having watery poop. GET HELP RIGHT AWAY IF:   You cannot drink fluids without throwing up (vomiting).  You keep throwing up.  You have blood in your poop, or your poop looks black and tarry.  You do not pee (urinate) in 6-8 hours, or there is only a small amount of very dark pee.  You have belly (abdominal)  pain that gets worse or stays in the same spot (localizes).  You are weak, dizzy, confused, or light-headed.  You have a very bad headache.  Your watery poop gets worse or does not get better.  You have a fever or lasting symptoms for more than 2-3 days.  You have a fever and your symptoms suddenly get worse. MAKE SURE YOU:   Understand these instructions.  Will watch your condition.  Will get help right away if you are not doing well or get worse. Document Released: 01/15/2008 Document Revised: 12/13/2013 Document Reviewed: 04/05/2012 [facility] Patient Information 2015 [place], [place]. This information is not intended to replace advice given to you by your health care provider. Make sure you discuss any questions you have with your health care provider.  Emergency Department Resource Guide 1) Find a Doctor and Pay Out of Pocket Although you won't have to find out who is covered by your insurance plan, it is a good idea to ask around and get recommendations. You will then need to call the office and see if the doctor you have chosen will accept you as a new patient and what types of options they offer for patients who are self-pay. Some doctors offer discounts or will set up payment plans for their patients who do not have insurance, but you will need to ask so you aren't surprised when you get to your appointment.  2) Contact Your Local Health Department Not all health departments have doctors  that can see patients for sick visits, but many do, so it is worth a call to see if yours does. If you don't know where your local health department is, you can check in your phone book. The CDC also has a tool to help you locate your state's health department, and many state websites also have listings of all of their local health departments.  3) Find a Walk-in Clinic If your illness is not likely to be very severe or complicated, you may want to try a walk in clinic. These are popping up all  over the country in pharmacies, drugstores, and shopping centers. They're usually staffed by nurse practitioners or physician assistants that have been trained to treat common illnesses and complaints. They're usually fairly quick and inexpensive. However, if you have serious medical issues or chronic medical problems, these are probably not your best option.  No Primary Care Doctor: - Call Health Connect at  [phone] - they can help you locate a primary care doctor that  accepts your insurance, provides certain services, etc. - Physician Referral Service- [phone]  Chronic Pain Problems: Organization         Address  Phone   Notes  [facility]  [phone] Patients need to be referred by their primary care doctor.   Medication Assistance: Organization         Address  Phone   Notes  [facility] Medication [facility] [address] [place], [place] [address] [phone] --Must be a resident of [facility] -- Must have NO insurance coverage whatsoever (no Medicaid/ Medicare, etc.) -- The pt. MUST have a primary care doctor that directs their care regularly and follows them in the community   [facility]  [phone]   [employer]  [phone]    Agencies that provide inexpensive medical care: Organization         Address  Phone   Notes  [facility]  [phone]   [facility]    [phone]   [facility] [address] [place], [place] [address] [phone]   [facility] [address] [place], [place] [phone]   Planned Parenthood    [phone]   [facility]    [phone]   [facility]  [address], [place] Phone:  [phone], Fax:  [phone] Hours of Operation:  9 am - 6 pm, M-F.  Also accepts Medicaid/Medicare and self-pay.  [facility]  [address], [place] Phone: [phone], Fax: [phone]. Hours of Operation:  8:30 am - 5:30 pm, M-F.  Also accepts Medicaid and self-pay.  [facility] [place] [address], [place] Phone: [phone]   [facility] [address] Natasha Bence [place], [place] [phone], Ext. [phone] Mondays & Thursdays: 7-9 AM.  First 15 patients are seen on a first come, first serve basis.    Medicaid-accepting [facility] Providers:  Organization         Address  Phone   Notes  [facility] [address],  [phone] Also accepts self-pay patients.  [facility] [address] Laurell Josephs [place], [place]  [phone]   [facility] [address], [place] [phone]   [facility] [address]  [place], [place] [phone]   Renaye Rakers [address], [place]   [phone] Only accepts [place] Access [place] patients after they have their name applied to their card.   Self-Pay (no insurance) in [facility]:  Organization         Address  Phone   Notes  Sickle Cell Patients, [facility] [address] [place], [place] [phone]   [facility] [address] [place], [place] [phone]   [facility] [place]  [address],  [phone]   [facility]/Dr. Osei-Bonsu  [address] [place] or [address], [place] [phone] Phone number for both [place] and [place] locations is the same.  [facility]gent Medical and [facility] [address], [place] [phone]   [facility] [address] [place] or [address] [phone]   [facility] [address], [place] [phone], phone; [phone], fax Sees patients 1st and 3rd Saturday of every  month.  Must not qualify for public or private insurance (i.e. Medicaid, Medicare, [place] Health Choice, Veterans' Benefits)  Household income should be no more than 200% of the poverty level The clinic cannot treat you if you are pregnant or think you are pregnant  Sexually transmitted diseases are not treated at the clinic.    Dental Care: Organization         Address  Phone  Notes  [facility] Department of [facility] [address] [place], [place] [phone] Accepts children up to age 8 who are enrolled in [place] or [place] Health Choice; pregnant women with a Medicaid card; and children who have applied for Medicaid or [place] Health Choice, but were declined, whose parents can pay a reduced fee at time of service.  [facility] Department of [facility]  [address], [place] [phone] Accepts children up to age 60 who are enrolled in [place] or [place] Health Choice; pregnant women with a Medicaid card; and children who have applied for Medicaid or [place] Health Choice, but were declined, whose parents can pay a reduced fee at time of service.  [facility]  [address] [place], [place] [phone] Patients are seen by appointment only. Walk-ins are not accepted. [facility] will see patients 70 years of age and older. Monday - Tuesday (8am-5pm) Most Wednesdays (8:30-5pm) $30 per visit, cash only  [facility] Adult Dental Access [facility]  [address], [facility] [phone] Patients are seen by appointment only. Walk-ins are not accepted. [facility] will see patients 67 years of age and older. One Wednesday Evening (Monthly: Volunteer Based).  $30 per visit, cash only  [employer]  [phone] for adults; Children under age 74, call Graduate Pediatric Dentistry at [phone]. Children aged 9-14, please call [phone] to request a pediatric application.  Dental  services are provided in all areas of dental care including fillings, crowns and bridges, complete and partial dentures, implants, gum treatment, root canals, and extractions. Preventive care is also provided. Treatment is provided to both adults and children. Patients are selected via a lottery and there is often a waiting list.   [facility] [address], [place]  [phone] [email or web address]   [facility] [address] [place], [place] [phone], Ext. [phone] Second and Fourth Thursday of each month, opens at 6:30 AM; Clinic ends at 9 AM.  Patients are seen on a first-come first-served basis, and a limited number are seen during each clinic.   [facility]  [address] Ether Griffins [place], [place] [phone]   Eligibility Requirements You must have lived in [place], [place], or [place] counties for at least the last three months.   You cannot be eligible for state or federal sponsored [place], including [employer], [place], or [employer].   You generally cannot be eligible for healthcare insurance through your employer.    How to apply: Eligibility screenings are held every Tuesday and Wednesday afternoon from 1:00 pm until 4:00 pm. You do not need an appointment for the interview!  [facility] [address] [place], [place] [phone]   [facility]  [phone]   [facility] Health Department  [phone]   [facility] Health Department  [phone]    Behavioral Health Resources in the Community: Intensive Outpatient Programs Organization         Address  Phone  Notes  [facility] [address], [place], [place] [phone]   [facility] [address] [place], [place] [phone]   [facility] [address], [place], [place]  [phone]   [facility] Mental Health [address]    [place], [place] [phone] or [phone]   Substance Abuse Resources Organization         Address  Phone  Notes  [facility] Drug Services  [phone]   [facility]  [phone]   [facility]  [phone]   Floydene Flock  [phone]   Residential & Outpatient Substance Abuse Program  [phone]   Psychological Services Organization         Address  Phone  Notes  [facility] Behavioral Health  [phone]   [facility] Services  [phone]   [facility] [address] [place] [phone] or [phone]    Mobile Crisis Teams Organization         Address  Phone  Notes  [facility], Mobile Crisis Care Unit  [phone]   [facility]  [address] [place], [place] [phone]   Doristine Locks [address] [place] [phone]    Self-Help/Support Groups Organization         Address  Phone             Notes  [facility] [place] - variety of support groups  336- [phone] Call for more information  Narcotics Anonymous (NA), [facility] [address], [employer]   2 meetings at this location   Statistician         Address  Phone  Notes  [facility] [address],    [place]  [phone]   [facility]  [address] [place] [address], [place], [place] [phone]   [facility] [address] [place], [place] [phone] Admissions: 8am-3pm M-F  [facility] [address],    [place], [place] [phone]   [facility] [address] Starling Manns [place], [place] [phone]   [facility] [address]  [place], [place] [phone]   [facility] - Intensive Outpatient [phone] Laurell Joseph[address], [place], [place]  [phone]   [facility] ([facility] [address] [place]  [place], [place]  [phone] or [phone]   [facility] ([facility]) [address], [place], [place] [phone] Accepts Medicaid  [facility] [address]  [place] [phone] Substance Abuse/Addiction Treatment   [facility] Organization         Address  Phone  Notes  [facility]  [phone]   Angie Fava, PhD [address] Ervin Knack [place], [place]   [phone] or [phone]   [facility] Behavioral   [address] [place], [place] [phone]   [facility] [address] [place], [place] [phone] Insurance/Medicaid/sponsorship through [facility] and Families [address]                                    [place], [place] [phone] Therapy/tele-psych/case  [facility] [address], [place] [phone]    Dr. Lolly Mustache  [phone]   [facility]  [employer] [facility] Dept. 1) [address], [place] 2) [address] [place] 3)  [address], [place] [phone]  [phone]   [facility] Child Abuse Hotline 440-797-3897 or [phone] (After Hours)

## 2015-02-03 NOTE — Telephone Encounter (Signed)
Spoke with pt and made her aware that she could purchase Potassium for $5.10 at [employer]. CM will leave copy of GoodRx coupon as well as Info on [employer] [facility] [employer] here at [employer]. pt understands she needs PCP.   By Yvone Neu, RN

## 2015-07-27 ENCOUNTER — Emergency Department (HOSPITAL_COMMUNITY): Payer: Self-pay

## 2015-07-27 ENCOUNTER — Emergency Department (HOSPITAL_COMMUNITY)
Admission: EM | Admit: 2015-07-27 | Discharge: 2015-07-27 | Disposition: A | Discharge status: Home / Self Care | Payer: Self-pay | Attending: Emergency Medicine | Admitting: Emergency Medicine

## 2015-07-27 ENCOUNTER — Encounter (HOSPITAL_COMMUNITY): Payer: Self-pay | Admitting: Emergency Medicine

## 2015-07-27 DIAGNOSIS — Z8719 Personal history of other diseases of the digestive system: Secondary | ICD-10-CM | POA: Insufficient documentation

## 2015-07-27 DIAGNOSIS — J069 Acute upper respiratory infection, unspecified: Secondary | ICD-10-CM | POA: Insufficient documentation

## 2015-07-27 DIAGNOSIS — R197 Diarrhea, unspecified: Secondary | ICD-10-CM | POA: Insufficient documentation

## 2015-07-27 DIAGNOSIS — N390 Urinary tract infection, site not specified: Secondary | ICD-10-CM | POA: Insufficient documentation

## 2015-07-27 DIAGNOSIS — F17210 Nicotine dependence, cigarettes, uncomplicated: Secondary | ICD-10-CM | POA: Insufficient documentation

## 2015-07-27 DIAGNOSIS — Z3202 Encounter for pregnancy test, result negative: Secondary | ICD-10-CM | POA: Insufficient documentation

## 2015-07-27 DIAGNOSIS — E876 Hypokalemia: Secondary | ICD-10-CM | POA: Insufficient documentation

## 2015-07-27 LAB — COMPREHENSIVE METABOLIC PANEL
ALBUMIN: 4.8 g/dL (ref 3.5–5.0)
ALK PHOS: 69 U/L (ref 38–126)
ALT: 28 U/L (ref 14–54)
ANION GAP: 9 (ref 5–15)
AST: 24 U/L (ref 15–41)
BUN: 8 mg/dL (ref 6–20)
CALCIUM: 9.4 mg/dL (ref 8.9–10.3)
CHLORIDE: 104 mmol/L (ref 101–111)
CO2: 25 mmol/L (ref 22–32)
Creatinine, Ser: 0.47 mg/dL (ref 0.44–1.00)
GFR calc Af Amer: >60 mL/min (ref >60)
GFR calc non Af Amer: >60 mL/min (ref >60)
GLUCOSE: 96 mg/dL (ref 65–99)
POTASSIUM: 2.5 mmol/L — AB (ref 3.5–5.1)
SODIUM: 138 mmol/L (ref 135–145)
Total Bilirubin: 1.5 mg/dL — ABNORMAL HIGH (ref 0.3–1.2)
Total Protein: 8.7 g/dL — ABNORMAL HIGH (ref 6.5–8.1)

## 2015-07-27 LAB — CBC WITH DIFFERENTIAL/PLATELET
BASOS PCT: 0 %
Basophils Absolute: 0 10*3/uL (ref 0.0–0.1)
Eosinophils Absolute: 0 10*3/uL (ref 0.0–0.7)
Eosinophils Relative: 0 %
HEMATOCRIT: 39.5 % (ref 36.0–46.0)
HEMOGLOBIN: 14.1 g/dL (ref 12.0–15.0)
LYMPHS ABS: 1 10*3/uL (ref 0.7–4.0)
LYMPHS PCT: 13 %
MCH: 29 pg (ref 26.0–34.0)
MCHC: 35.7 g/dL (ref 30.0–36.0)
MCV: 81.1 fL (ref 78.0–100.0)
MONOS PCT: 8 %
Monocytes Absolute: 0.6 10*3/uL (ref 0.1–1.0)
NEUTROS ABS: 6.4 10*3/uL (ref 1.7–7.7)
NEUTROS PCT: 79 %
Platelets: 245 10*3/uL (ref 150–400)
RBC: 4.87 MIL/uL (ref 3.87–5.11)
RDW: 14.8 % (ref 11.5–15.5)
WBC: 8 10*3/uL (ref 4.0–10.5)

## 2015-07-27 LAB — URINALYSIS, ROUTINE W REFLEX MICROSCOPIC
Bilirubin Urine: NEGATIVE
GLUCOSE, UA: NEGATIVE mg/dL
KETONES UR: NEGATIVE mg/dL
NITRITE: NEGATIVE
PH: 7 (ref 5.0–8.0)
Protein, ur: NEGATIVE mg/dL
SPECIFIC GRAVITY, URINE: 1.005 (ref 1.005–1.030)

## 2015-07-27 LAB — MAGNESIUM: Magnesium: 2 mg/dL (ref 1.7–2.4)

## 2015-07-27 LAB — URINE MICROSCOPIC-ADD ON

## 2015-07-27 LAB — PREGNANCY, URINE: Preg Test, Ur: NEGATIVE

## 2015-07-27 LAB — LIPASE, BLOOD: LIPASE: 33 U/L (ref 11–51)

## 2015-07-27 MED ORDER — POTASSIUM CHLORIDE CRYS ER 20 MEQ PO TBCR
40.0000 meq | EXTENDED_RELEASE_TABLET | Freq: Once | ORAL | Status: AC
  Administered 2015-07-27: 40 meq via ORAL
  Filled 2015-07-27: qty 2

## 2015-07-27 MED ORDER — SODIUM CHLORIDE 0.9 % IV SOLN
1000.0000 mL | Freq: Once | INTRAVENOUS | Status: AC
  Administered 2015-07-27: 1000 mL via INTRAVENOUS

## 2015-07-27 MED ORDER — POTASSIUM CHLORIDE CRYS ER 20 MEQ PO TBCR: EXTENDED_RELEASE_TABLET | ORAL | Status: DC

## 2015-07-27 MED ORDER — CEPHALEXIN 500 MG PO CAPS: 500.0000 mg | ORAL_CAPSULE | Freq: Three times a day (TID) | ORAL | Status: DC

## 2015-07-27 MED ORDER — SODIUM CHLORIDE 0.9 % IV SOLN
1000.0000 mL | INTRAVENOUS | Status: DC
  Administered 2015-07-27: 1000 mL via INTRAVENOUS

## 2015-07-27 NOTE — ED Notes (Signed)
Pt presents via EMS from work c/o severe low back pain, abdominal pain, and "low potassium." Pt states pain is similar to a prior instance during which she had gallstones removed.  Decreased appetite, frequent dark urination, painful BM.  Nausea with minimal bile vomit.  Vitals 100/70, HR 96, O2 98% on room air, temp 99/4 temporal en route.  CBG 145.  Ambulatory on arrival.

## 2015-07-27 NOTE — ED Provider Notes (Signed)
CSN: [id number]     Arrival date & time 07/27/15  0226 History  By signing my name below, I, Sara Hill, attest that this documentation has been prepared under the direction and in the presence of Devoria Albe, MD. Electronically Signed: Angelene Giovanni, ED Scribe. 07/27/2015. 3:04 AM.      Chief Complaint  Patient presents with  . Back Pain   The history is provided by the patient. No language interpreter was used.   HPI Comments: Sara Hill is a 24 y.o. female with a hx of cholecystectomy who presents to the Emergency Department complaining of gradually worsening intermittent cramping upper abdominal pain that lasts for approx. 2 minutes onset last week. She reports associated loss of appetite, 7-8 episodes of loose and watery diarrhea daily, one episode of vomiting, and lightheadedness. She also reports that she has clear rhinorrhea, stuffy nose, subjective fever, and productive cough with clear sputum onset 3 days ago. She reports SOB onset tonight while at work. She denies any chills, nausea, sore throat (did have sore throat but is gone now), nausea, or numbness. She explains that the pain is worse when she has the diarrhea. She states that she reports from work where she works third shift and sometimes she lifts heavy boxes. She reports that she had these symptoms 3 months ago and was given medicine for her diarrhea. She denies being diagnosed with any specific condition at that time but states that she had low potassium, magnesium and low electrolytes. She denies following up with a physician at that time.   She also c/o gradually worsening intermittent moderate 3 consecutive sharp shooting pain to her right flank that lasts for several seconds each onset one week ago. She reports associated dark urine and weakness. She denies any bowel/bladder incontinence, dysuria, or hematuria. She reports that these symptoms are consistent with when she had low potassium and her gall stones  were taken out in 2011. She denies any cigarette or alcohol use. Pt's flu vaccine is not UTD.   No PCP.    Past Medical History  Diagnosis Date  . Low blood potassium   . Gall stones    Past Surgical History  Procedure Laterality Date  . Cholecystectomy     No family history on file. Social History  Substance Use Topics  . Smoking status: Light Tobacco Smoker -- 1.00 packs/day    Types: Cigarettes  . Smokeless tobacco: None  . Alcohol Use: No   employed  OB History    No data available     Review of Systems  Constitutional: Positive for fever. Negative for chills.  HENT: Positive for rhinorrhea. Negative for sore throat.   Respiratory: Positive for cough and shortness of breath.   Gastrointestinal: Positive for vomiting, abdominal pain and diarrhea. Negative for nausea.  Genitourinary: Negative for dysuria and hematuria.  Musculoskeletal: Positive for back pain.  Neurological: Positive for light-headedness.  All other systems reviewed and are negative.     Allergies  Benadryl  Home Medications   Prior to Admission medications   Medication Sig Start Date End Date Taking? Authorizing Provider  acetaminophen (TYLENOL) 325 MG tablet Take 650 mg by mouth every 6 (six) hours as needed for headache (cramps).   Yes Historical Provider, MD  cephALEXin (KEFLEX) 500 MG capsule Take 1 capsule (500 mg total) by mouth 3 (three) times daily. 07/27/15   Devoria Albe, MD  naproxen (NAPROSYN) 500 MG tablet Take 1 tablet (500 mg total) by mouth 2 (two) times  daily. Patient not taking: Reported on 01/25/2015 01/20/15   Catha Gosselin, PA-C  potassium chloride (K-DUR) 10 MEQ tablet Take 1 tablet (10 mEq total) by mouth daily. Patient taking differently: Take 10 mEq by mouth 2 (two) times daily.  01/20/15   Hanna Patel-Mills, PA-C  potassium chloride SA (K-DUR,KLOR-CON) 20 MEQ tablet Take 1 po BID x 7 days then once a day 07/27/15   Devoria Albe, MD   BP 116/74 mmHg  Pulse 83  Temp(Src)  97.9 F (36.6 C) (Oral)  Resp 16  SpO2 100%  LMP 07/13/2015 (Exact Date)  Vital signs normal   Physical Exam  Constitutional: She is oriented to person, place, and time. She appears well-developed and well-nourished.  Non-toxic appearance. She does not appear ill. No distress.  HENT:  Head: Normocephalic and atraumatic.  Right Ear: External ear normal.  Left Ear: External ear normal.  Nose: Nose normal. No mucosal edema or rhinorrhea.  Mouth/Throat: Mucous membranes are normal. No dental abscesses or uvula swelling.  Oropharynx mucous membrane dry.  When she breathes, she sounds like she has nasal stuffiness.   Eyes: Conjunctivae and EOM are normal. Pupils are equal, round, and reactive to light.  Neck: Normal range of motion and full passive range of motion without pain. Neck supple.  Cardiovascular: Normal rate, regular rhythm and normal heart sounds.  Exam reveals no gallop and no friction rub.   No murmur heard. Pulmonary/Chest: Effort normal and breath sounds normal. No respiratory distress. She has no wheezes. She has no rhonchi. She has no rales. She exhibits no tenderness and no crepitus.  Pt is coughing   Abdominal: Soft. Normal appearance and bowel sounds are normal. She exhibits no distension. There is no tenderness. There is no rebound and no guarding.  She points to her right upper abdomen as where her pain is located but it is non-tender to palpation.   Genitourinary:  Pt indicates pain in right flank area.   Musculoskeletal: Normal range of motion. She exhibits no edema or tenderness.  Moves all extremities well.   Neurological: She is alert and oriented to person, place, and time. She has normal strength. No cranial nerve deficit.  Skin: Skin is warm, dry and intact. No rash noted. No erythema. No pallor.  Psychiatric: She has a normal mood and affect. Her speech is normal and behavior is normal. Her mood appears not anxious.  Nursing note and vitals reviewed.   ED  Course  Procedures (including critical care time)  Medications  0.9 %  sodium chloride infusion (0 mLs Intravenous Stopped 07/27/15 0447)    Followed by  0.9 %  sodium chloride infusion (0 mLs Intravenous Stopped 07/27/15 0737)  potassium chloride SA (K-DUR,KLOR-CON) CR tablet 40 mEq (not administered)  potassium chloride SA (K-DUR,KLOR-CON) CR tablet 40 mEq (40 mEq Oral Given 07/27/15 0446)    DIAGNOSTIC STUDIES: Oxygen Saturation is 100% on RA, normal by my interpretation.    COORDINATION OF CARE: 2:56 AM- Pt advised of plan for treatment and pt agrees. Pt will receive IV fluids.   04:25 pt was given her test results. She is still getting her IV fluids. She was started on oral potassium for her hypokalemia   Labs Review Results for orders placed or performed during the hospital encounter of 07/27/15  Comprehensive metabolic panel  Result Value Ref Range   Sodium 138 135 - 145 mmol/L   Potassium 2.5 (LL) 3.5 - 5.1 mmol/L   Chloride 104 101 - 111 mmol/L  CO2 25 22 - 32 mmol/L   Glucose, Bld 96 65 - 99 mg/dL   BUN 8 6 - 20 mg/dL   Creatinine, Ser 4.09 0.44 - 1.00 mg/dL   Calcium 9.4 8.9 - 81.1 mg/dL   Total Protein 8.7 (H) 6.5 - 8.1 g/dL   Albumin 4.8 3.5 - 5.0 g/dL   AST 24 15 - 41 U/L   ALT 28 14 - 54 U/L   Alkaline Phosphatase 69 38 - 126 U/L   Total Bilirubin 1.5 (H) 0.3 - 1.2 mg/dL   GFR calc non Af Amer >60 >60 mL/min   GFR calc Af Amer >60 >60 mL/min   Anion gap 9 5 - 15  CBC with Differential  Result Value Ref Range   WBC 8.0 4.0 - 10.5 K/uL   RBC 4.87 3.87 - 5.11 MIL/uL   Hemoglobin 14.1 12.0 - 15.0 g/dL   HCT 91.4 78.2 - 95.6 %   MCV 81.1 78.0 - 100.0 fL   MCH 29.0 26.0 - 34.0 pg   MCHC 35.7 30.0 - 36.0 g/dL   RDW 21.3 08.6 - 57.8 %   Platelets 245 150 - 400 K/uL   Neutrophils Relative % 79 %   Neutro Abs 6.4 1.7 - 7.7 K/uL   Lymphocytes Relative 13 %   Lymphs Abs 1.0 0.7 - 4.0 K/uL   Monocytes Relative 8 %   Monocytes Absolute 0.6 0.1 - 1.0 K/uL    Eosinophils Relative 0 %   Eosinophils Absolute 0.0 0.0 - 0.7 K/uL   Basophils Relative 0 %   Basophils Absolute 0.0 0.0 - 0.1 K/uL  Magnesium  Result Value Ref Range   Magnesium 2.0 1.7 - 2.4 mg/dL  Lipase, blood  Result Value Ref Range   Lipase 33 11 - 51 U/L  Urinalysis, Routine w reflex microscopic  Result Value Ref Range   Color, Urine YELLOW YELLOW   APPearance CLEAR CLEAR   Specific Gravity, Urine 1.005 1.005 - 1.030   pH 7.0 5.0 - 8.0   Glucose, UA NEGATIVE NEGATIVE mg/dL   Hgb urine dipstick LARGE (A) NEGATIVE   Bilirubin Urine NEGATIVE NEGATIVE   Ketones, ur NEGATIVE NEGATIVE mg/dL   Protein, ur NEGATIVE NEGATIVE mg/dL   Nitrite NEGATIVE NEGATIVE   Leukocytes, UA TRACE (A) NEGATIVE  Pregnancy, urine  Result Value Ref Range   Preg Test, Ur NEGATIVE NEGATIVE  Urine microscopic-add on  Result Value Ref Range   Squamous Epithelial / LPF 0-5 (A) NONE SEEN   WBC, UA 0-5 0 - 5 WBC/hpf   RBC / HPF 6-30 0 - 5 RBC/hpf   Bacteria, UA FEW (A) NONE SEEN    Laboratory interpretation all normal except hypokalemia    Imaging Review Dg Chest 2 View  07/27/2015  CLINICAL DATA:  Cough and fever for 3 days.  Aches all over. EXAM: CHEST  2 VIEW COMPARISON:  None. FINDINGS: Hyperinflation of the lungs. Normal heart size and pulmonary vascularity. No focal airspace disease or consolidation in the lungs. Vague nodular opacities over the lower lungs likely represent prominent nipple shadows. No blunting of costophrenic angles. No pneumothorax. Mediastinal contours appear intact. IMPRESSION: No active cardiopulmonary disease. Electronically Signed   By: Burman Nieves M.D.   On: 07/27/2015 03:52     Devoria Albe, MD has personally reviewed and evaluated these images and lab results as part of my medical decision-making.   EKG Interpretation  Date/Time:  Thursday July 27 2015 05:03:58 EST Ventricular Rate:  73 PR  Interval:  170 QRS Duration: 110 QT Interval:  418 QTC  Calculation: 461 R Axis:   83 Text Interpretation:  Sinus rhythm RSR' in V1 or V2, right VCD or RVH Nonspecific T abnormalities, lateral leads No significant change since last tracing 25 Jan 2015 Confirmed by Aanika Defoor  MD-I, Ozetta Flatley ([id number]) on 07/27/2015 5:11:38 AM        MDM   patient presents with multiple symptoms for the past week including diarrhea, loss of appetite, weakness, intermittent right flank pain and upper abdominal pains with URI symptoms that only started 3 days ago. Her labs show possible UTI and hypokalemia. Her EKG was with out changes. She was started on oral potassium supplementation. Her potassium has been this low in the past.    Final diagnoses:  Hypokalemia  Diarrhea, unspecified type  Urinary tract infection without hematuria, site unspecified  URI, acute   New Prescriptions   CEPHALEXIN (KEFLEX) 500 MG CAPSULE    Take 1 capsule (500 mg total) by mouth 3 (three) times daily.   POTASSIUM CHLORIDE SA (K-DUR,KLOR-CON) 20 MEQ TABLET    Take 1 po BID x 7 days then once a day    Plan discharge  Devoria AlbeIva Sanda Dejoy, MD, FACEP   I personally performed the services described in this documentation, which was scribed in my presence. The recorded information has been reviewed and considered.  Devoria AlbeIva Rupinder Livingston, MD, Concha PyoFACEP   Arlynn Stare, MD 07/27/15 [id number]

## 2015-07-27 NOTE — ED Notes (Signed)
Bed: WA09 Expected date:  Expected time:  Means of arrival:  Comments: EMS 24 yo female abdominal and back pain, low grade fever

## 2015-07-27 NOTE — Discharge Instructions (Signed)
Drink plenty of fluids. Take the potassium pills as prescribed. Use imodium OTC for diarrhea. Avoid milk until the diarrhea is gone. You can take mucinex DM OTC for your cough. Take the antibiotics for your possible urinary tract infection. Recheck if you get a fever, uncontrolled vomiting, or you feel worse instead of better.    Diarrhea Diarrhea is watery poop (stool). It can make you feel weak, tired, thirsty, or give you a dry mouth (signs of dehydration). Watery poop is a sign of another problem, most often an infection. It often lasts 2-3 days. It can last longer if it is a sign of something serious. Take care of yourself as told by your doctor. HOME CARE   Drink 1 cup (8 ounces) of fluid each time you have watery poop.  Do not drink the following fluids:  Those that contain simple sugars (fructose, glucose, galactose, lactose, sucrose, maltose).  Sports drinks.  Fruit juices.  Whole milk products.  Sodas.  Drinks with caffeine (coffee, tea, soda) or alcohol.  Oral rehydration solution may be used if the doctor says it is okay. You may make your own solution. Follow this recipe:   - teaspoon table salt.   teaspoon baking soda.   teaspoon salt substitute containing potassium chloride.  1 tablespoons sugar.  1 liter (34 ounces) of water.  Avoid the following foods:  High fiber foods, such as raw fruits and vegetables.  Nuts, seeds, and whole grain breads and cereals.   Those that are sweetened with sugar alcohols (xylitol, sorbitol, mannitol).  Try eating the following foods:  Starchy foods, such as rice, toast, pasta, low-sugar cereal, oatmeal, baked potatoes, crackers, and bagels.  Bananas.  Applesauce.  Eat probiotic-rich foods, such as yogurt and milk products that are fermented.  Wash your hands well after each time you have watery poop.  Only take medicine as told by your doctor.  Take a warm bath to help lessen burning or pain from having watery  poop. GET HELP RIGHT AWAY IF:   You cannot drink fluids without throwing up (vomiting).  You keep throwing up.  You have blood in your poop, or your poop looks black and tarry.  You do not pee (urinate) in 6-8 hours, or there is only a small amount of very dark pee.  You have belly (abdominal) pain that gets worse or stays in the same spot (localizes).  You are weak, dizzy, confused, or light-headed.  You have a very bad headache.  Your watery poop gets worse or does not get better.  You have a fever or lasting symptoms for more than 2-3 days.  You have a fever and your symptoms suddenly get worse. MAKE SURE YOU:   Understand these instructions.  Will watch your condition.  Will get help right away if you are not doing well or get worse.   This information is not intended to replace advice given to you by your health care provider. Make sure you discuss any questions you have with your health care provider.   Document Released: 01/15/2008 Document Revised: 08/19/2014 Document Reviewed: 04/05/2012 Elsevier Interactive Patient Education 2016 [employer].  Food Choices to Help Relieve Diarrhea, Adult When you have diarrhea, the foods you eat and your eating habits are very important. Choosing the right foods and drinks can help relieve diarrhea. Also, because diarrhea can last up to 7 days, you need to replace lost fluids and electrolytes (such as sodium, potassium, and chloride) in order to help prevent dehydration.  WHAT GENERAL GUIDELINES DO I NEED TO FOLLOW?  Slowly drink 1 cup (8 oz) of fluid for each episode of diarrhea. If you are getting enough fluid, your urine will be clear or pale yellow.  Eat starchy foods. Some good choices include white rice, white toast, pasta, low-fiber cereal, baked potatoes (without the skin), saltine crackers, and bagels.  Avoid large servings of any cooked vegetables.  Limit fruit to two servings per day. A serving is  cup or 1 small  piece.  Choose foods with less than 2 g of fiber per serving.  Limit fats to less than 8 tsp (38 g) per day.  Avoid fried foods.  Eat foods that have probiotics in them. Probiotics can be found in certain dairy products.  Avoid foods and beverages that may increase the speed at which food moves through the stomach and intestines (gastrointestinal tract). Things to avoid include:  High-fiber foods, such as dried fruit, raw fruits and vegetables, nuts, seeds, and whole grain foods.  Spicy foods and high-fat foods.  Foods and beverages sweetened with high-fructose corn syrup, honey, or sugar alcohols such as xylitol, sorbitol, and mannitol. WHAT FOODS ARE RECOMMENDED? Grains White rice. White, [place], or pita breads (fresh or toasted), including plain rolls, buns, or bagels. White pasta. Saltine, soda, or graham crackers. Pretzels. Low-fiber cereal. Cooked cereals made with water (such as cornmeal, farina, or cream cereals). Plain muffins. Matzo. Melba toast. Zwieback.  Vegetables Potatoes (without the skin). Strained tomato and vegetable juices. Most well-cooked and canned vegetables without seeds. Tender lettuce. Fruits Cooked or canned applesauce, apricots, cherries, fruit cocktail, grapefruit, peaches, pears, or plums. Fresh bananas, apples without skin, cherries, grapes, cantaloupe, grapefruit, peaches, oranges, or plums.  Meat and Other Protein Products Baked or boiled chicken. Eggs. Tofu. Fish. Seafood. Smooth peanut butter. Ground or well-cooked tender beef, ham, veal, lamb, pork, or poultry.  Dairy Plain yogurt, kefir, and unsweetened liquid yogurt. Lactose-free milk, buttermilk, or soy milk. Plain hard cheese. Beverages Sport drinks. Clear broths. Diluted fruit juices (except prune). Regular, caffeine-free sodas such as ginger ale. Water. Decaffeinated teas. Oral rehydration solutions. Sugar-free beverages not sweetened with sugar alcohols. Other Bouillon, broth, or soups made  from recommended foods.  The items listed above may not be a complete list of recommended foods or beverages. Contact your dietitian for more options. WHAT FOODS ARE NOT RECOMMENDED? Grains Whole grain, whole wheat, bran, or rye breads, rolls, pastas, crackers, and cereals. Wild or brown rice. Cereals that contain more than 2 g of fiber per serving. Corn tortillas or taco shells. Cooked or dry oatmeal. Granola. Popcorn. Vegetables Raw vegetables. Cabbage, broccoli, Brussels sprouts, artichokes, baked beans, beet greens, corn, kale, legumes, peas, sweet potatoes, and yams. Potato skins. Cooked spinach and cabbage. Fruits Dried fruit, including raisins and dates. Raw fruits. Stewed or dried prunes. Fresh apples with skin, apricots, mangoes, pears, raspberries, and strawberries.  Meat and Other Protein Products Chunky peanut butter. Nuts and seeds. Beans and lentils. Tomasa Blase.  Dairy High-fat cheeses. Milk, chocolate milk, and beverages made with milk, such as milk shakes. Cream. Ice cream. Sweets and Desserts Sweet rolls, doughnuts, and sweet breads. Pancakes and waffles. Fats and Oils Butter. Cream sauces. Margarine. Salad oils. Plain salad dressings. Olives. Avocados.  Beverages Caffeinated beverages (such as coffee, tea, soda, or energy drinks). Alcoholic beverages. Fruit juices with pulp. Prune juice. Soft drinks sweetened with high-fructose corn syrup or sugar alcohols. Other Coconut. Hot sauce. Chili powder. Mayonnaise. Gravy. Cream-based or milk-based soups.  The  items listed above may not be a complete list of foods and beverages to avoid. Contact your dietitian for more information. WHAT SHOULD I DO IF I BECOME DEHYDRATED? Diarrhea can sometimes lead to dehydration. Signs of dehydration include dark urine and dry mouth and skin. If you think you are dehydrated, you should rehydrate with an oral rehydration solution. These solutions can be purchased at pharmacies, retail stores, or online.    Drink -1 cup (120-240 mL) of oral rehydration solution each time you have an episode of diarrhea. If drinking this amount makes your diarrhea worse, try drinking smaller amounts more often. For example, drink 1-3 tsp (5-15 mL) every 5-10 minutes.  A general rule for staying hydrated is to drink 1-2 L of fluid per day. Talk to your health care provider about the specific amount you should be drinking each day. Drink enough fluids to keep your urine clear or pale yellow.   This information is not intended to replace advice given to you by your health care provider. Make sure you discuss any questions you have with your health care provider.   Document Released: 10/19/2003 Document Revised: 08/19/2014 Document Reviewed: 06/21/2013 Elsevier Interactive Patient Education 2016 [employer].  Hypokalemia Hypokalemia means that the amount of potassium in the blood is lower than normal.Potassium is a chemical, called an electrolyte, that helps regulate the amount of fluid in the body. It also stimulates muscle contraction and helps nerves function properly.Most of the body's potassium is inside of cells, and only a very small amount is in the blood. Because the amount in the blood is so small, minor changes can be life-threatening. CAUSES  Antibiotics.  Diarrhea or vomiting.  Using laxatives too much, which can cause diarrhea.  Chronic kidney disease.  Water pills (diuretics).  Eating disorders (bulimia).  Low magnesium level.  Sweating a lot. SIGNS AND SYMPTOMS  Weakness.  Constipation.  Fatigue.  Muscle cramps.  Mental confusion.  Skipped heartbeats or irregular heartbeat (palpitations).  Tingling or numbness. DIAGNOSIS  Your health care provider can diagnose hypokalemia with blood tests. In addition to checking your potassium level, your health care provider may also check other lab tests. TREATMENT Hypokalemia can be treated with potassium supplements taken by mouth or  adjustments in your current medicines. If your potassium level is very low, you may need to get potassium through a vein (IV) and be monitored in the hospital. A diet high in potassium is also helpful. Foods high in potassium are:  Nuts, such as peanuts and pistachios.  Seeds, such as sunflower seeds and pumpkin seeds.  Peas, lentils, and lima beans.  Whole grain and bran cereals and breads.  Fresh fruit and vegetables, such as apricots, avocado, bananas, cantaloupe, kiwi, oranges, tomatoes, asparagus, and potatoes.  Orange and tomato juices.  Red meats.  Fruit yogurt. HOME CARE INSTRUCTIONS  Take all medicines as prescribed by your health care provider.  Maintain a healthy diet by including nutritious food, such as fruits, vegetables, nuts, whole grains, and lean meats.  If you are taking a laxative, be sure to follow the directions on the label. SEEK MEDICAL CARE IF:  Your weakness gets worse.  You feel your heart pounding or racing.  You are vomiting or having diarrhea.  You are diabetic and having trouble keeping your blood glucose in the normal range. SEEK IMMEDIATE MEDICAL CARE IF:  You have chest pain, shortness of breath, or dizziness.  You are vomiting or having diarrhea for more than 2 days.  You faint. MAKE SURE YOU:   Understand these instructions.  Will watch your condition.  Will get help right away if you are not doing well or get worse.   This information is not intended to replace advice given to you by your health care provider. Make sure you discuss any questions you have with your health care provider.   Document Released: 07/29/2005 Document Revised: 08/19/2014 Document Reviewed: 01/29/2013 Elsevier Interactive Patient Education 2016 Else[employer] Inc.  Upper Respiratory Infection, Adult Most upper respiratory infections (URIs) are caused by a virus. A URI affects the nose, throat, and upper air passages. The most common type of URI is often  called "the common cold." HOME CARE   Take medicines only as told by your doctor.  Gargle warm saltwater or take cough drops to comfort your throat as told by your doctor.  Use a warm mist humidifier or inhale steam from a shower to increase air moisture. This may make it easier to breathe.  Drink enough fluid to keep your pee (urine) clear or pale yellow.  Eat soups and other clear broths.  Have a healthy diet.  Rest as needed.  Go back to work when your fever is gone or your doctor says it is okay.  You may need to stay home longer to avoid giving your URI to others.  You can also wear a face mask and wash your hands often to prevent spread of the virus.  Use your inhaler more if you have asthma.  Do not use any tobacco products, including cigarettes, chewing tobacco, or electronic cigarettes. If you need help quitting, ask your doctor. GET HELP IF:  You are getting worse, not better.  Your symptoms are not helped by medicine.  You have chills.  You are getting more short of breath.  You have brown or red mucus.  You have yellow or brown discharge from your nose.  You have pain in your face, especially when you bend forward.  You have a fever.  You have puffy (swollen) neck glands.  You have pain while swallowing.  You have white areas in the back of your throat. GET HELP RIGHT AWAY IF:   You have very bad or constant:  Headache.  Ear pain.  Pain in your forehead, behind your eyes, and over your cheekbones (sinus pain).  Chest pain.  You have long-lasting (chronic) lung disease and any of the following:  Wheezing.  Long-lasting cough.  Coughing up blood.  A change in your usual mucus.  You have a stiff neck.  You have changes in your:  Vision.  Hearing.  Thinking.  Mood. MAKE SURE YOU:   Understand these instructions.  Will watch your condition.  Will get help right away if you are not doing well or get worse.   This  information is not intended to replace advice given to you by your health care provider. Make sure you discuss any questions you have with your health care provider.   Document Released: 01/15/2008 Document Revised: 12/13/2014 Document Reviewed: 11/03/2013 Elsevier Interactive Patient Education 2016 Else[employer] Inc.  Urinary Tract Infection A urinary tract infection (UTI) can occur any place along the urinary tract. The tract includes the kidneys, ureters, bladder, and urethra. A type of germ called bacteria often causes a UTI. UTIs are often helped with antibiotic medicine.  HOME CARE   If given, take antibiotics as told by your doctor. Finish them even if you start to feel better.  Drink enough fluids to  keep your pee (urine) clear or pale yellow.  Avoid tea, drinks with caffeine, and bubbly (carbonated) drinks.  Pee often. Avoid holding your pee in for a long time.  Pee before and after having sex (intercourse).  Wipe from front to back after you poop (bowel movement) if you are a woman. Use each tissue only once. GET HELP RIGHT AWAY IF:   You have back pain.  You have lower belly (abdominal) pain.  You have chills.  You feel sick to your stomach (nauseous).  You throw up (vomit).  Your burning or discomfort with peeing does not go away.  You have a fever.  Your symptoms are not better in 3 days. MAKE SURE YOU:   Understand these instructions.  Will watch your condition.  Will get help right away if you are not doing well or get worse.   This information is not intended to replace advice given to you by your health care provider. Make sure you discuss any questions you have with your health care provider.   Document Released: 01/15/2008 Document Revised: 08/19/2014 Document Reviewed: 02/27/2012 Elsevier Interactive Patient Education [employer]

## 2015-07-28 LAB — URINE CULTURE
CULTURE: NO GROWTH
SPECIAL REQUESTS: NORMAL

## 2016-01-12 ENCOUNTER — Emergency Department (HOSPITAL_COMMUNITY)
Admission: EM | Admit: 2016-01-12 | Discharge: 2016-01-12 | Disposition: A | Discharge status: Home / Self Care | Payer: Self-pay | Attending: Emergency Medicine | Admitting: Emergency Medicine

## 2016-01-12 ENCOUNTER — Encounter (HOSPITAL_COMMUNITY): Payer: Self-pay | Admitting: *Deleted

## 2016-01-12 DIAGNOSIS — Z792 Long term (current) use of antibiotics: Secondary | ICD-10-CM | POA: Insufficient documentation

## 2016-01-12 DIAGNOSIS — F17210 Nicotine dependence, cigarettes, uncomplicated: Secondary | ICD-10-CM | POA: Insufficient documentation

## 2016-01-12 DIAGNOSIS — B070 Plantar wart: Secondary | ICD-10-CM | POA: Insufficient documentation

## 2016-01-12 DIAGNOSIS — Z79899 Other long term (current) drug therapy: Secondary | ICD-10-CM | POA: Insufficient documentation

## 2016-01-12 NOTE — ED Notes (Signed)
Pt ambulatory to triage with steady gait. PT states she has a cyst on the bottom of her left foot that is causing her sharp pains for about a year or so that is making it hard to walk.

## 2016-01-12 NOTE — ED Provider Notes (Signed)
CSN: [id number]     Arrival date & time 01/12/16  2114 History  By signing my name below, I, Ronney Lion, attest that this documentation has been prepared under the direction and in the presence of Mattie Marlin, PA-C. Electronically Signed: Ronney Lion, ED Scribe. 01/12/2016. 12:53 AM.    Chief Complaint  Patient presents with  . Cyst   The history is provided by the patient. No language interpreter was used.   HPI Comments: Sara Hill is a 25 y.o. female who presents to the Emergency Department complaining of a gradual-onset, constant, gradually worsening cyst on the sole of her left foot, causing sharp pains, that began 1 year ago. She also complains of occasional associated drainage from the area. She states she also has a small wart on her right foot, which resembles how the cyst on her left foot appeared when it first onset. Walking or applying pressure to her foot exacerbates her pain. Resting her foot alleviates her pain. She states she has tried ibuprofen with no relief to her pain. She denies a history of autoimmune disorders or cancer.  Past Medical History  Diagnosis Date  . Low blood potassium   . Gall stones    Past Surgical History  Procedure Laterality Date  . Cholecystectomy     No family history on file. Social History  Substance Use Topics  . Smoking status: Light Tobacco Smoker -- 1.00 packs/day    Types: Cigarettes  . Smokeless tobacco: None  . Alcohol Use: No   OB History    No data available     Review of Systems  Constitutional: Negative for fever and chills.  Skin:       Positive for cyst on left foot.   Allergic/Immunologic: Negative for immunocompromised state.    Allergies  Benadryl  Home Medications   Prior to Admission medications   Medication Sig Start Date End Date Taking? Authorizing Provider  acetaminophen (TYLENOL) 325 MG tablet Take 650 mg by mouth every 6 (six) hours as needed for headache (cramps).    Historical Provider, MD   cephALEXin (KEFLEX) 500 MG capsule Take 1 capsule (500 mg total) by mouth 3 (three) times daily. 07/27/15   Devoria Albe, MD  naproxen (NAPROSYN) 500 MG tablet Take 1 tablet (500 mg total) by mouth 2 (two) times daily. Patient not taking: Reported on 01/25/2015 01/20/15   Catha Gosselin, PA-C  potassium chloride (K-DUR) 10 MEQ tablet Take 1 tablet (10 mEq total) by mouth daily. Patient taking differently: Take 10 mEq by mouth 2 (two) times daily.  01/20/15   Hanna Patel-Mills, PA-C  potassium chloride SA (K-DUR,KLOR-CON) 20 MEQ tablet Take 1 po BID x 7 days then once a day 07/27/15   Devoria Albe, MD   BP 100/68 mmHg  Pulse 77  Temp(Src) 97.9 F (36.6 C)  Resp 17  Ht 5' (1.524 m)  Wt 92 lb (41.731 kg)  BMI 17.97 kg/m2  SpO2 98%  LMP 12/29/2015 Physical Exam  Constitutional: She is oriented to person, place, and time. She appears well-developed and well-nourished. No distress.  HENT:  Head: Normocephalic and atraumatic.  Eyes: Conjunctivae and EOM are normal.  Neck: Neck supple. No tracheal deviation present.  Cardiovascular: Normal rate.   Pulmonary/Chest: Effort normal. No respiratory distress.  Musculoskeletal: Normal range of motion.  Neurological: She is alert and oriented to person, place, and time.  Skin: Skin is warm and dry.  7 x 4 cm raised lesion that is firm with areas of  scaling, TTP, no erythema or sign of infection, on sole of left foot, small <1cm lesion on bottom of right heel resembling a plantars wart.   Psychiatric: She has a normal mood and affect. Her behavior is normal.  Nursing note and vitals reviewed.   ED Course  Procedures (including critical care time)  DIAGNOSTIC STUDIES: Oxygen Saturation is 98% on RA, normal by my interpretation.    COORDINATION OF CARE: 11:14 PM - Discussed treatment plan with pt at bedside which includes referral to podiatrist. Pt verbalized understanding and agreed to plan.   MDM   Final diagnoses:  Plantar wart   Patient  with lesion on bottom of left foot for 1 year that resembles a very large planters wart. Discussed with patient that she needs to follow-up with Dr. Charlsie Merlesregal a podiatrist to have this reevaluated. No signs of surrounding cellulitis. I instructed her to follow-up with Dr. Charlsie Merlesregal on Monday. I included his contact information in the discharge instructions. I instructed the patient to continue use ibuprofen for pain relief.  I discussed strict return precautions. Patient expressed understanding to the discharge instructions.  I personally performed the services described in this documentation, which was scribed in my presence. The recorded information has been reviewed and is accurate.       Jerre SimonJessica L Jamien Casanova, PA 01/13/16 [id number]  Doug SouSam Jacubowitz, MD 01/13/16 Cleophas [id number]

## 2016-01-12 NOTE — ED Provider Notes (Signed)
Complains of painful lesion on plantar service of left foot for 1 year. Pain is worse with walking. On exam she is alert and in no distress. She reports that she was seen at another facility 1 month ago. She was referred to a foot specialist however she lost the paperwork with the referral Right lower extremity plantar service there is a 3 cm round lesion which is raised reddened, not warm. Skin overlying lesion is scaly appearing. DP pulse 2+. Good capillary refill. Patient's foot lesion is one year old. I don't feel that incision would be beneficial. She is referred to podiatrist.  Doug SouSam Wilford Merryfield, MD 01/12/16 [id number]

## 2016-01-12 NOTE — Discharge Instructions (Signed)
Call Dr. Charlsie Merles on Monday and set up an appointment to be seen regarding the growth on your foot.   Return to emergency department if you experience worsening pain, redness, warmth of the growth in the area surrounding the growth, fever or chills.  Plantar Warts Warts are small growths on the skin. They can occur on various areas of the body. When they occur on the underside (sole) of the foot, they are called plantar warts. Plantar warts often occur in groups, with several small warts around a larger growth. They tend to develop over areas of pressure, such as the heel or the ball of the foot. Most warts are not painful, and they usually do not cause problems. However, plantar warts may cause pain when you walk because pressure is applied to them. Warts often go away on their own in time. Various treatments may be done if needed. Sometimes, warts go away and then they come back again. CAUSES Plantar warts are caused by a type of virus that is called human papillomavirus (HPV). HPV attacks a break in the skin of the foot. Walking barefoot can lead to exposure to the virus. These warts may spread to other areas of the sole. They spread to other areas of the body only through direct contact. RISK FACTORS Plantar warts are more likely to develop in:  People who are 59-49 years of age.  People who use public showers or locker rooms.  People who have a weakened body defense system (immune system). SYMPTOMS Plantar warts may be flat or slightly raised. They may grow into the deeper layers of skin or rise above the surface of the skin. Most plantar warts have a rough surface. They may cause pain when you use your foot to support your body weight. DIAGNOSIS A plantar wart can usually be diagnosed from its appearance. In some cases, a tissue sample may be removed (biopsy) to be looked at under a microscope. TREATMENT In many cases, warts do not need treatment. Without treatment, they often go away over a  period of many months to a couple years. If treatment is needed, options may include:  Applying medicated solutions, creams, or patches to the wart. These may be over-the-counter or prescription medicines that make the skin soft so that layers will gradually shed away. In many cases, the medicine is applied one or two times per day and covered with a bandage.  Putting duct tape over the top of the wart (occlusion). You will leave the tape in place for as long as told by your health care provider, then you will replace it with a new strip of tape. This is done until the wart goes away.  Freezing the wart with liquid nitrogen (cryotherapy).  Burning the wart with:  Laser treatment.  An electrified probe (electrocautery).  Injection of a medicine (Candida antigen) into the wart to help the body's immune system to fight off the wart.  Surgery to remove the wart. HOME CARE INSTRUCTIONS  Apply medicated creams or solutions only as told by your health care provider. This may involve:  Soaking the affected area in warm water.  Removing the top layer of softened skin before you apply the medicine. A pumice stone works well for removing the tissue.  Applying a bandage over the affected area after you apply the medicine.  Repeating the process daily or as told by your health care provider.  Do not scratch or pick at a wart.  Wash your hands after you touch  a wart.  If a wart is painful, try applying a bandage with a hole in the middle over the wart. The helps to take pressure off the wart.  Keep all follow-up visits as told by your health care provider. This is important. PREVENTION Take these actions to help prevent warts:  Wear shoes and socks. Change your socks daily.  Keep your feet clean and dry.  Check your feet regularly.  Avoid direct contact with warts on other people. SEEK MEDICAL CARE IF:  Your warts do not improve after treatment.  You have redness, swelling, or  pain at the site of a wart.  You have bleeding from a wart that does not stop with light pressure.  You have diabetes and you develop a wart.   This information is not intended to replace advice given to you by your health care provider. Make sure you discuss any questions you have with your health care provider.   Document Released: 10/19/2003 Document Revised: 04/19/2015 Document Reviewed: 10/24/2014 Elsevier Interactive Patient Education [employer]2016 [employer] Inc.

## 2016-11-12 ENCOUNTER — Encounter (HOSPITAL_COMMUNITY): Payer: Self-pay | Admitting: Emergency Medicine

## 2016-11-12 ENCOUNTER — Emergency Department (HOSPITAL_COMMUNITY)
Admission: EM | Admit: 2016-11-12 | Discharge: 2016-11-13 | Disposition: A | Discharge status: Home / Self Care | Payer: Self-pay | Attending: Emergency Medicine | Admitting: Emergency Medicine

## 2016-11-12 DIAGNOSIS — R2242 Localized swelling, mass and lump, left lower limb: Secondary | ICD-10-CM | POA: Insufficient documentation

## 2016-11-12 DIAGNOSIS — F17210 Nicotine dependence, cigarettes, uncomplicated: Secondary | ICD-10-CM | POA: Insufficient documentation

## 2016-11-12 NOTE — ED Provider Notes (Signed)
[facility]Y [facility]PT Provider Note   CSN: [id number] Arrival date & time: 11/12/16  2214  By signing my name below, I, Cynda Acres, attest that this documentation has been prepared under the direction and in the presence of Roxy Horseman, PA-C. Electronically Signed: Cynda Acres, Scribe. 11/12/16. 11:24 PM.  History   Chief Complaint Chief Complaint  Patient presents with  . Foot Pain   HPI Comments: Sara Hill is a 26 y.o. female with no pertinent medical history, who presents to the Emergency Department complaining of sudden-onset, constant left heel pain that began a few years ago. Patient reports being seen multiple times for the same problem. Patient reports a "growth" to the bottom of the heel. Patient states she has to walk on her tip toes in order to avoid pain. No modifying factors indicated. Patient states ambulating makes her pain worse, resting improves her pain. Patient denies any numbness, weakness, nausea, vomiting, or fever.   The history is provided by the patient. No language interpreter was used.    Past Medical History:  Diagnosis Date  . Gall stones   . Low blood potassium     There are no active problems to display for this patient.   Past Surgical History:  Procedure Laterality Date  . CHOLECYSTECTOMY      OB History    No data available       Home Medications    Prior to Admission medications   Medication Sig Start Date End Date Taking? Authorizing Provider  acetaminophen (TYLENOL) 325 MG tablet Take 650 mg by mouth every 6 (six) hours as needed for headache (cramps).    Historical Provider, MD  cephALEXin (KEFLEX) 500 MG capsule Take 1 capsule (500 mg total) by mouth 3 (three) times daily. 07/27/15   Devoria Albe, MD  naproxen (NAPROSYN) 500 MG tablet Take 1 tablet (500 mg total) by mouth 2 (two) times daily. Patient not taking: Reported on 01/25/2015 01/20/15   Catha Gosselin, PA-C  potassium chloride (K-DUR) 10 MEQ tablet Take 1  tablet (10 mEq total) by mouth daily. Patient taking differently: Take 10 mEq by mouth 2 (two) times daily.  01/20/15   Hanna Patel-Mills, PA-C  potassium chloride SA (K-DUR,KLOR-CON) 20 MEQ tablet Take 1 po BID x 7 days then once a day 07/27/15   Devoria Albe, MD    Family History History reviewed. No pertinent family history.  Social History Social History  Substance Use Topics  . Smoking status: Light Tobacco Smoker    Packs/day: 1.00    Types: Cigarettes  . Smokeless tobacco: Never Used  . Alcohol use No     Allergies   Benadryl [diphenhydramine hcl]   Review of Systems Review of Systems  Constitutional: Negative for fever.  Gastrointestinal: Negative for nausea and vomiting.  Musculoskeletal: Positive for arthralgias (left heel).  Neurological: Negative for weakness and numbness.     Physical Exam Updated Vital Signs BP 101/63 (BP Location: Right Arm)   Pulse 76   Temp 97.5 F (36.4 C) (Oral)   Resp 16   Ht 5' (1.524 m)   Wt 90 lb (40.8 kg)   SpO2 100%   BMI 17.58 kg/m   Physical Exam  Constitutional: She is oriented to person, place, and time. She appears well-developed and well-nourished.  HENT:  Head: Normocephalic.  Eyes: EOM are normal.  Neck: Normal range of motion.  Pulmonary/Chest: Effort normal.  Abdominal: She exhibits no distension.  Musculoskeletal: Normal range of motion.  Neurological: She is alert  and oriented to person, place, and time.  Skin:  Large ?plantar wart to heel of left foot, no sign of cellulitis or abscess  Psychiatric: She has a normal mood and affect.  Nursing note and vitals reviewed.    ED Treatments / Results  DIAGNOSTIC STUDIES: Oxygen Saturation is 100% on RA, normal by my interpretation.    COORDINATION OF CARE: 11:22 PM Discussed treatment plan with pt at bedside and pt agreed to plan, which includes an orthopedic follow-up and anti-inflammatory pain medication.   Labs (all labs ordered are listed, but only  abnormal results are displayed) Labs Reviewed - No data to display  EKG  EKG Interpretation None       Radiology No results found.  Procedures Procedures (including critical care time)  Medications Ordered in ED Medications - No data to display   Initial Impression / Assessment and Plan / ED Course  I have reviewed the triage vital signs and the nursing notes.  Pertinent labs & imaging results that were available during my care of the patient were reviewed by me and considered in my medical decision making (see chart for details).     Patient with large mass to bottom of left foot.  Recommend follow-up with Podiatry.  VSS.  No sign of infection or abscess.  Final Clinical Impressions(s) / ED Diagnoses   Final diagnoses:  Mass of left foot    New Prescriptions New Prescriptions   No medications on file   I personally performed the services described in this documentation, which was scribed in my presence. The recorded information has been reviewed and is accurate.      Roxy Horseman, PA-C 11/13/16 0021    Shaune Pollack, MD 11/13/16 [phone]

## 2016-11-12 NOTE — ED Notes (Signed)
Patient return from xray as order was cancelled.

## 2016-11-12 NOTE — ED Notes (Signed)
Patient transported to X-ray 

## 2016-11-12 NOTE — ED Triage Notes (Signed)
Patient with pain in the left heel.  She states that she has a bump on the bottom of her heel, she has to walk on her tip toes due to the pain.  She has had this pain for awhile.

## 2016-11-13 ENCOUNTER — Telehealth: Payer: Self-pay | Admitting: *Deleted

## 2016-11-13 NOTE — Telephone Encounter (Signed)
EDCM noted CM consult.  Reviewed chart and basis for consult are unclear as the CM consult order is incomplete and the EDP discharge note does not mention CM instructions.  Will follow-up with EDP.

## 2017-03-04 ENCOUNTER — Encounter (HOSPITAL_COMMUNITY): Payer: Self-pay | Admitting: Emergency Medicine

## 2017-03-04 DIAGNOSIS — K061 Gingival enlargement: Secondary | ICD-10-CM | POA: Insufficient documentation

## 2017-03-04 DIAGNOSIS — Z9189 Other specified personal risk factors, not elsewhere classified: Secondary | ICD-10-CM | POA: Insufficient documentation

## 2017-03-04 DIAGNOSIS — Z720 Tobacco use: Secondary | ICD-10-CM | POA: Insufficient documentation

## 2017-03-04 DIAGNOSIS — K0500 Acute gingivitis, plaque induced: Secondary | ICD-10-CM | POA: Insufficient documentation

## 2017-03-04 MED ORDER — OXYCODONE-ACETAMINOPHEN 5-325 MG PO TABS
ORAL_TABLET | ORAL | Status: AC
  Filled 2017-03-04: qty 1

## 2017-03-04 MED ORDER — OXYCODONE-ACETAMINOPHEN 5-325 MG PO TABS
1.0000 | ORAL_TABLET | ORAL | Status: DC | PRN
  Administered 2017-03-04: 1 via ORAL

## 2017-03-04 NOTE — ED Triage Notes (Signed)
Pt reports mouth pain X1 week, pt has braces and has not seen her orthodontist in 3 years. Looks as if she is biting on her gums causing sores.

## 2017-03-05 ENCOUNTER — Emergency Department (HOSPITAL_COMMUNITY)
Admission: EM | Admit: 2017-03-05 | Discharge: 2017-03-05 | Disposition: A | Discharge status: Home / Self Care | Payer: Self-pay | Attending: Emergency Medicine | Admitting: Emergency Medicine

## 2017-03-05 DIAGNOSIS — K0500 Acute gingivitis, plaque induced: Secondary | ICD-10-CM

## 2017-03-05 DIAGNOSIS — K1379 Other lesions of oral mucosa: Secondary | ICD-10-CM

## 2017-03-05 DIAGNOSIS — Z9189 Other specified personal risk factors, not elsewhere classified: Secondary | ICD-10-CM

## 2017-03-05 DIAGNOSIS — K061 Gingival enlargement: Secondary | ICD-10-CM

## 2017-03-05 MED ORDER — NAPROXEN 500 MG PO TABS: 500.0000 mg | ORAL_TABLET | Freq: Two times a day (BID) | ORAL | 0 refills | Status: DC | PRN

## 2017-03-05 MED ORDER — CHLORHEXIDINE GLUCONATE 0.12 % MT SOLN: 15.0000 mL | Freq: Two times a day (BID) | OROMUCOSAL | 0 refills | Status: AC

## 2017-03-05 MED ORDER — PENICILLIN V POTASSIUM 500 MG PO TABS: 1000.0000 mg | ORAL_TABLET | Freq: Two times a day (BID) | ORAL | 0 refills | Status: DC

## 2017-03-05 MED ORDER — HYDROCODONE-ACETAMINOPHEN 5-325 MG PO TABS: 1.0000 | ORAL_TABLET | Freq: Four times a day (QID) | ORAL | 0 refills | Status: DC | PRN

## 2017-03-05 NOTE — Discharge Instructions (Signed)
Apply warm compresses to jaw throughout the day. Take antibiotic until finished. Use mouth rinse as directed. Alternate between naprosyn and norco as directed as needed for pain but don't drive or operate machinery while taking norco. Perform salt water swishes to help with pain/swelling. Use over the counter oragel as needed for additional relief. Followup with a dentist is very important for ongoing management of your gingivitis due to prolonged use of your braces. Call the oral surgeon listed above in the next 24-48 hours to schedule ongoing dental care, or use the list below to find a dentist in the next 24-48 hours for ongoing management of your dental issue. Return to emergency department for emergent changing or worsening symptoms.

## 2017-03-05 NOTE — ED Provider Notes (Signed)
[facility] Provider Note   CSN: [id number] Arrival date & time: 03/04/17  2323     History   Chief Complaint Chief Complaint  Patient presents with  . Mouth Pain    HPI Sara Hill is a 26 y.o. female with a PMHx of hypokalemia, who presents to the ED with complaints of left upper and lower gingival pain 1 week. Patient states that she has not seen an orthodontist since after having her braces put on 3 yrs ago; she was lost to follow-up because she left [place] and then subsequently lost her insurance so she never followed up. Gradually over the last 1 week she developed left upper and lower gum pain she describes as 10/10 constant sharp L sided gingival pain radiating to the left ear, worse with chewing, and mildly improved with heat but unrelieved with Tylenol and Advil. She has also noted swelling in the lower left gums, as well as gingival bleeding which she states is chronic. She denies any gum drainage, drooling, trismus, ear drainage, rhinorrhea, cough, fevers, chills, CP, SOB, abd pain, N/V/D/C, hematuria, dysuria, myalgias, arthralgias, numbness, tingling, focal weakness, or any other complaints at this time. She is a nonsmoker. She does not see a dentist or orthodontist at this time, and hasn't seen either in quite a while due to lack of insurance.    The history is provided by the patient and medical records. No language interpreter was used.  Mouth Lesions  Location:  Lower gingiva and upper gingiva Quality:  Painful Pain details:    Quality:  Sharp   Severity:  Severe   Duration:  1 week   Timing:  Constant   Progression:  Unchanged Onset quality:  Gradual Severity:  Severe Duration:  1 week Progression:  Unchanged Chronicity:  New Relieved by: heat. Worsened by:  Eating Ineffective treatments: tylenol and advil. Associated symptoms: ear pain   Associated symptoms: no fever, no rhinorrhea and no sore throat     Past Medical History:    Diagnosis Date  . Gall stones   . Low blood potassium     There are no active problems to display for this patient.   Past Surgical History:  Procedure Laterality Date  . CHOLECYSTECTOMY      OB History    No data available       Home Medications    Prior to Admission medications   Medication Sig Start Date End Date Taking? Authorizing Provider  acetaminophen (TYLENOL) 325 MG tablet Take 650 mg by mouth every 6 (six) hours as needed for headache (cramps).    [provider]  cephALEXin (KEFLEX) 500 MG capsule Take 1 capsule (500 mg total) by mouth 3 (three) times daily. 07/27/15   Devoria AlbeKnapp, Iva, MD  naproxen (NAPROSYN) 500 MG tablet Take 1 tablet (500 mg total) by mouth 2 (two) times daily. Patient not taking: Reported on 01/25/2015 01/20/15   Patel-Mills, Lorelle FormosaHanna, PA-C  potassium chloride (K-DUR) 10 MEQ tablet Take 1 tablet (10 mEq total) by mouth daily. Patient taking differently: Take 10 mEq by mouth 2 (two) times daily.  01/20/15   Patel-Mills, Lorelle FormosaHanna, PA-C  potassium chloride SA (K-DUR,KLOR-CON) 20 MEQ tablet Take 1 po BID x 7 days then once a day 07/27/15   Devoria AlbeKnapp, Iva, MD    Family History No family history on file.  Social History Social History  Substance Use Topics  . Smoking status: Light Tobacco Smoker    Packs/day: 1.00    Types: Cigarettes  .  Smokeless tobacco: Never Used  . Alcohol use No     Allergies   Benadryl [diphenhydramine hcl]   Review of Systems Review of Systems  Constitutional: Negative for chills and fever.  HENT: Positive for dental problem, ear pain and mouth sores. Negative for drooling, ear discharge, rhinorrhea, sore throat and trouble swallowing.   Respiratory: Negative for cough and shortness of breath.   Cardiovascular: Negative for chest pain.  Gastrointestinal: Negative for abdominal pain, constipation, diarrhea, nausea and vomiting.  Genitourinary: Negative for dysuria and hematuria.  Musculoskeletal: Negative for  arthralgias and myalgias.  Skin: Negative for color change.  Allergic/Immunologic: Negative for immunocompromised state.  Neurological: Negative for weakness and numbness.  Psychiatric/Behavioral: Negative for confusion.   All other systems reviewed and are negative for acute change except as noted in the HPI.    Physical Exam Updated Vital Signs BP 96/65   Pulse 62   Temp 98.7 F (37.1 C) (Oral)   Resp 18   Ht 5' (1.524 m)   Wt 39.5 kg (87 lb)   LMP 02/25/2017 (Approximate)   SpO2 100%   BMI 16.99 kg/m   Physical Exam  Constitutional: She is oriented to person, place, and time. Vital signs are normal. She appears well-developed and well-nourished.  Non-toxic appearance. No distress.  Afebrile, nontoxic, NAD  HENT:  Head: Normocephalic and atraumatic.  Right Ear: Hearing, tympanic membrane, external ear and ear canal normal.  Left Ear: Hearing, tympanic membrane, external ear and ear canal normal.  Nose: Nose normal.  Mouth/Throat: Uvula is midline, oropharynx is clear and moist and mucous membranes are normal. Oral lesions (gingival hypertrophy/swelling) present. No trismus in the jaw. Dental caries present. No dental abscesses or uvula swelling. Tonsils are 0 on the right. Tonsils are 0 on the left. No tonsillar exudate.  Ears are clear bilaterally. Nose clear. Diffuse gingival hypertrophy noted throughout entire mouth, with gums nearly covering the brackets on her teeth; L upper and lower gingiva erythematous and irritated appearing, no focal dental/gingival abscess, but diffuse tenderness along entire gumline. Poor dentitia throughout. No evidence of ludwig's, handling secretions well. Oropharynx clear and moist, without uvular swelling or deviation, no trismus or drooling, no tonsillar swelling or erythema, no exudates.    Eyes: Conjunctivae and EOM are normal. Right eye exhibits no discharge. Left eye exhibits no discharge.  Neck: Normal range of motion. Neck supple.    Cardiovascular: Normal rate and intact distal pulses.   Pulmonary/Chest: Effort normal. No respiratory distress.  Abdominal: Normal appearance. She exhibits no distension.  Musculoskeletal: Normal range of motion.  Neurological: She is alert and oriented to person, place, and time. She has normal strength. No sensory deficit.  Skin: Skin is warm, dry and intact. No rash noted.  Psychiatric: She has a normal mood and affect. Her behavior is normal.  Nursing note and vitals reviewed.    ED Treatments / Results  Labs (all labs ordered are listed, but only abnormal results are displayed) Labs Reviewed - No data to display  EKG  EKG Interpretation None       Radiology No results found.  Procedures Procedures (including critical care time)  Medications Ordered in ED Medications  oxyCODONE-acetaminophen (PERCOCET/ROXICET) 5-325 MG per tablet 1 tablet (1 tablet Oral Given 03/04/17 2334)     Initial Impression / Assessment and Plan / ED Course  I have reviewed the triage vital signs and the nursing notes.  Pertinent labs & imaging results that were available during my care of the  patient were reviewed by me and considered in my medical decision making (see chart for details).     26 y.o. female here with L sided gingival pain x1 week; hasn't had braces tended to since she had them placed 3 yrs ago. Gums have hypertrophied nearly over the brackets, and the gingiva on the L upper and lower mouth is very erythematous and irritated, no abscess noted but diffuse gingival hypertrophy throughout entire mouth. No abscess but gingivitis likely, and possible early infection; patient afebrile, non toxic appearing and swallowing secretions well, no evidence of ludwig's. I gave patient referral to an oral surgeon and stressed the importance of dental/oral surgery/orthodontic follow up for ultimate management of this issue; she needs to have her braces taken off/cared for.  I have also discussed  reasons to return immediately to the ER.  Patient expresses understanding and agrees with plan.  I will also give PCN VK, peridex, and pain meds. Discussed OTC remedies for pain control as well. [place] reviewed prior to dispensing controlled substance medications, and 1 year search was notable for: none found. Risks/benefits/alternatives and expectations discussed regarding controlled substances. Side effects of medications discussed. Informed consent obtained.    Final Clinical Impressions(s) / ED Diagnoses   Final diagnoses:  Acute gingivitis  Mouth pain  Gingival hypertrophy  Poor oral hygiene    New Prescriptions New Prescriptions   CHLORHEXIDINE (PERIDEX) 0.12 % SOLUTION    Use as directed 15 mLs in the mouth or throat 2 (two) times daily. SWISH AND SPIT for 30 seconds twice daily (morning and evening) after toothbrushing x7 days   HYDROCODONE-ACETAMINOPHEN (NORCO) 5-325 MG TABLET    Take 1 tablet by mouth every 6 (six) hours as needed for severe pain.   NAPROXEN (NAPROSYN) 500 MG TABLET    Take 1 tablet (500 mg total) by mouth 2 (two) times daily as needed for mild pain, moderate pain or headache (TAKE WITH MEALS.).   PENICILLIN V POTASSIUM (VEETID) 500 MG TABLET    Take 2 tablets (1,000 mg total) by mouth 2 (two) times daily. X 7 days     [id number], [place]cedes, [place]PA-[place] 03/05/17 0301    Ward, Layla MawKristen N, DO 03/05/17 [id number]

## 2017-09-08 ENCOUNTER — Other Ambulatory Visit: Payer: Self-pay

## 2017-09-08 ENCOUNTER — Encounter (HOSPITAL_COMMUNITY): Payer: Self-pay | Admitting: Emergency Medicine

## 2017-09-08 DIAGNOSIS — E876 Hypokalemia: Secondary | ICD-10-CM | POA: Insufficient documentation

## 2017-09-08 DIAGNOSIS — F17210 Nicotine dependence, cigarettes, uncomplicated: Secondary | ICD-10-CM | POA: Insufficient documentation

## 2017-09-08 NOTE — ED Triage Notes (Signed)
Pt c/o abdominal pain with nausea/vomiting x 2 weeks. Pt concerned she may have an eating disorder, denies making herself vomit, but states sometimes she just doesn't feel like eating.

## 2017-09-09 ENCOUNTER — Emergency Department (HOSPITAL_COMMUNITY)
Admission: EM | Admit: 2017-09-09 | Discharge: 2017-09-09 | Disposition: A | Discharge status: Home / Self Care | Payer: Self-pay | Attending: Emergency Medicine | Admitting: Emergency Medicine

## 2017-09-09 DIAGNOSIS — E876 Hypokalemia: Secondary | ICD-10-CM

## 2017-09-09 DIAGNOSIS — R1084 Generalized abdominal pain: Secondary | ICD-10-CM

## 2017-09-09 LAB — URINALYSIS, ROUTINE W REFLEX MICROSCOPIC
BILIRUBIN URINE: NEGATIVE
GLUCOSE, UA: NEGATIVE mg/dL
HGB URINE DIPSTICK: NEGATIVE
KETONES UR: NEGATIVE mg/dL
LEUKOCYTES UA: NEGATIVE
Nitrite: NEGATIVE
PH: 8 (ref 5.0–8.0)
PROTEIN: NEGATIVE mg/dL
Specific Gravity, Urine: 1.005 (ref 1.005–1.030)

## 2017-09-09 LAB — I-STAT BETA HCG BLOOD, ED (MC, WL, AP ONLY): I-stat hCG, quantitative: <5 m[IU]/mL

## 2017-09-09 LAB — COMPREHENSIVE METABOLIC PANEL
ALBUMIN: 4 g/dL (ref 3.5–5.0)
ALT: 18 U/L (ref 14–54)
AST: 22 U/L (ref 15–41)
Alkaline Phosphatase: 65 U/L (ref 38–126)
Anion gap: 11 (ref 5–15)
BUN: 6 mg/dL (ref 6–20)
CHLORIDE: 102 mmol/L (ref 101–111)
CO2: 23 mmol/L (ref 22–32)
Calcium: 8.8 mg/dL — ABNORMAL LOW (ref 8.9–10.3)
Creatinine, Ser: 0.51 mg/dL (ref 0.44–1.00)
GFR calc Af Amer: >60 mL/min (ref >60)
GLUCOSE: 97 mg/dL (ref 65–99)
Potassium: 2.1 mmol/L — CL (ref 3.5–5.1)
Sodium: 136 mmol/L (ref 135–145)
Total Bilirubin: 1.5 mg/dL — ABNORMAL HIGH (ref 0.3–1.2)
Total Protein: 7.4 g/dL (ref 6.5–8.1)

## 2017-09-09 LAB — POTASSIUM: Potassium: 2.6 mmol/L — CL (ref 3.5–5.1)

## 2017-09-09 LAB — CBC
HCT: 37.1 % (ref 36.0–46.0)
Hemoglobin: 13.1 g/dL (ref 12.0–15.0)
MCH: 28.6 pg (ref 26.0–34.0)
MCHC: 35.3 g/dL (ref 30.0–36.0)
MCV: 81 fL (ref 78.0–100.0)
Platelets: 246 10*3/uL (ref 150–400)
RBC: 4.58 MIL/uL (ref 3.87–5.11)
RDW: 14.8 % (ref 11.5–15.5)
WBC: 7.2 10*3/uL (ref 4.0–10.5)

## 2017-09-09 LAB — LIPASE, BLOOD: Lipase: 38 U/L (ref 11–51)

## 2017-09-09 LAB — MAGNESIUM: MAGNESIUM: 1.8 mg/dL (ref 1.7–2.4)

## 2017-09-09 MED ORDER — POTASSIUM CHLORIDE 10 MEQ/100ML IV SOLN
10.0000 meq | INTRAVENOUS | Status: AC
  Administered 2017-09-09 (×2): 10 meq via INTRAVENOUS
  Filled 2017-09-09 (×3): qty 100

## 2017-09-09 MED ORDER — POTASSIUM CHLORIDE ER 10 MEQ PO TBCR: 10.0000 meq | EXTENDED_RELEASE_TABLET | Freq: Two times a day (BID) | ORAL | 0 refills | Status: DC

## 2017-09-09 MED ORDER — POTASSIUM CHLORIDE CRYS ER 20 MEQ PO TBCR
40.0000 meq | EXTENDED_RELEASE_TABLET | Freq: Once | ORAL | Status: AC
  Administered 2017-09-09: 40 meq via ORAL
  Filled 2017-09-09: qty 2

## 2017-09-09 NOTE — ED Notes (Signed)
Crying c/o IV potassium burning  Iv slowed down and warm pack applied

## 2017-09-09 NOTE — Discharge Instructions (Signed)
Begin taking potassium twice daily for the next week.  Try to eat a diet of potassium rich foods.  Try to eat a diet of bland foods that will not upset your stomach.  Follow-up with gastroenterology for reevaluation of your symptoms.  Return to the emergency department if any concerning signs or symptoms develop.

## 2017-09-09 NOTE — ED Notes (Signed)
Patient came back and got her phone

## 2017-09-09 NOTE — ED Provider Notes (Signed)
[facility] Provider Note   CSN: [id number] Arrival date & time: 09/08/17  2305    History   Chief Complaint Chief Complaint  Patient presents with  . Abdominal Pain    HPI Sara Hill is a 27 y.o. female.   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  She states that she usually vomits in the morning, shortly after drinking water.  Her abdominal pain is periumbilical and cramping.  She has been unable to eat over the past 2 days secondary to abdominal discomfort.  She also reports lack of appetite, anorexia.  She has had normal bowel movements and no urinary times.  No associated fevers.  She believes that she has had mild weight loss as well.  She has had an augmented eating habits since cholecystectomy 7 years ago.  She also discontinued her oral potassium tablets over 1 month ago.     Past Medical History:  Diagnosis Date  . Gall stones   . Low blood potassium     There are no active problems to display for this patient.   Past Surgical History:  Procedure Laterality Date  . CHOLECYSTECTOMY      OB History    No data available       Home Medications    Prior to Admission medications   Medication Sig Start Date End Date Taking? Authorizing Provider  cephALEXin (KEFLEX) 500 MG capsule Take 1 capsule (500 mg total) by mouth 3 (three) times daily. Patient not taking: Reported on 09/09/2017 07/27/15   Devoria AlbeKnapp, Iva, MD  HYDROcodone-acetaminophen [facility](NORCO) 5-325 MG tablet Take 1 tablet by mouth every 6 (six) hours as needed for severe pain. Patient not taking: Reported on 09/09/2017 03/05/17   Street, CorningMercedes, PA-C  naproxen (NAPROSYN) 500 MG tablet Take 1 tablet (500 mg total) by mouth 2 (two) times daily. Patient not taking: Reported on 01/25/2015 01/20/15   Patel-Mills, Lorelle FormosaHanna, PA-C  naproxen (NAPROSYN) 500 MG tablet  Take 1 tablet (500 mg total) by mouth 2 (two) times daily as needed for mild pain, moderate pain or headache (TAKE WITH MEALS.). Patient not taking: Reported on 09/09/2017 03/05/17   Street, HinckleyMercedes, PA-C  penicillin v potassium (VEETID) 500 MG tablet Take 2 tablets (1,000 mg total) by mouth 2 (two) times daily. X 7 days Patient not taking: Reported on 09/09/2017 03/05/17   Street, [place]Mercedes, PA-C  potassium chloride (K-DUR) 10 MEQ tablet Take 1 tablet (10 mEq total) by mouth daily. Patient not taking: Reported on 09/09/2017 01/20/15   Patel-Mills, Lorelle FormosaHanna, PA-C  potassium chloride SA (K-DUR,KLOR-CON) 20 MEQ tablet Take 1 po BID x 7 days then once a day Patient not taking: Reported on 09/09/2017 07/27/15   Devoria AlbeKnapp, Iva, MD    Family History No family history on file.  Social History Social History   Tobacco Use  . Smoking status: Light Tobacco Smoker    Packs/day: 1.00    Types: Cigarettes  . Smokeless tobacco: Never Used  Substance Use Topics  . Alcohol use: No  . Drug use: No     Allergies   Benadryl [diphenhydramine hcl]   Review of Systems Review of Systems Ten systems reviewed and are negative for acute change, except as noted in the HPI.    Physical Exam Updated Vital Signs BP 102/62 (BP Location: Right Arm)   Pulse 72   Temp 98.3 F (36.8 C) (  Oral)   Resp 18   Ht 5' (1.524 m)   Wt 40.8 kg (90 lb)   LMP 09/01/2017   SpO2 100%   BMI 17.58 kg/m   Physical Exam  Constitutional: She is oriented to person, place, and time. She appears well-developed. No distress.  Cachectic and frail-appearing  HENT:  Head: Normocephalic and atraumatic.  Dry mucous membranes  Eyes: Conjunctivae and EOM are normal. No scleral icterus.  Neck: Normal range of motion.  Cardiovascular: Normal rate, regular rhythm and intact distal pulses.  Pulmonary/Chest: Effort normal. No stridor. No respiratory distress. She has no wheezes.  Lungs clear to auscultation bilaterally  Abdominal: Soft.  She exhibits no mass. There is no guarding.  Abdomen soft, nondistended.  No focal abdominal tenderness.  Musculoskeletal: Normal range of motion.  Neurological: She is alert and oriented to person, place, and time. She exhibits normal muscle tone. Coordination normal.  GCS 15.  Patient moving all extremities  Skin: Skin is warm and dry. No rash noted. She is not diaphoretic. No erythema. No pallor.  Psychiatric: She has a normal mood and affect. Her behavior is normal.  Nursing note and vitals reviewed.    ED Treatments / Results  Labs (all labs ordered are listed, but only abnormal results are displayed) Labs Reviewed  COMPREHENSIVE METABOLIC PANEL - Abnormal; Notable for the following components:      Result Value   Potassium 2.1 (*)    Calcium 8.8 (*)    Total Bilirubin 1.5 (*)    All other components within normal limits  LIPASE, BLOOD  CBC  URINALYSIS, ROUTINE W REFLEX MICROSCOPIC  MAGNESIUM  POTASSIUM  I-STAT BETA HCG BLOOD, ED ([facility], [facility], [facility] ONLY)    EKG  EKG Interpretation  Date/Time:  Tuesday September 09 2017 04:55:15 EST Ventricular Rate:  69 PR Interval:    QRS Duration: 109 QT Interval:  431 QTC Calculation: 462 R Axis:   91 Text Interpretation:  Sinus rhythm Borderline right axis deviation RSR' in V1 or V2, probably normal variant Nonspecific T abnrm, anterolateral leads No significant change since last tracing Confirmed by Rochele Raring [phone]) on 09/09/2017 4:57:58 AM       Radiology No results found.  Procedures Procedures (including critical care time)  Medications Ordered in ED Medications  potassium chloride 10 mEq in 100 mL IVPB (10 mEq Intravenous New Bag/Given 09/09/17 0510)  potassium chloride SA (K-DUR,KLOR-CON) CR tablet 40 mEq (40 mEq Oral Given 09/09/17 0509)    CRITICAL CARE Performed by: Antony Madura   Total critical care time: 35 minutes  Critical care time was exclusive of separately billable procedures and treating other  patients.  Critical care was necessary to treat or prevent imminent or life-threatening deterioration.  Critical care was time spent personally by me on the following activities: development of treatment plan with patient and/or surrogate as well as nursing, discussions with consultants, evaluation of patient's response to treatment, examination of patient, obtaining history from patient or surrogate, ordering and performing treatments and interventions, ordering and review of laboratory studies, ordering and review of radiographic studies, pulse oximetry and re-evaluation of patient's condition.   Initial Impression / Assessment and Plan / ED Course  I have reviewed the triage vital signs and the nursing notes.  Pertinent labs & imaging results that were available during my care of the patient were reviewed by me and considered in my medical decision making (see chart for details).     27 year old female presents to the emergency  department for abdominal pain over the past 2 weeks.  Symptoms associated with nausea and vomiting, primarily in the morning after initial oral intake.  She is afebrile with stable vital signs, but anorexic in appearance.  She has no focal abdominal tenderness on exam.  Laboratory workup is generally reassuring.  No leukocytosis to suggest infectious etiology.  Chronicity of symptoms also makes this less likely. She Does have significant hypokalemia.  On chart review, potassium has never been above 3.0 and this was last back in April 2016.  Patient ordered to receive IV fluid hydration as well as oral and IV potassium.  Plan for repeat potassium to check for improving level.  If at or above 2.5, the patient is likely stable for discharge on a course of oral potassium to maintain levels.  Patient signed out to [facility], PA-C at shift change who will follow up on test results and disposition appropriately.   Final Clinical Impressions(s) / ED Diagnoses   Final  diagnoses:  Generalized abdominal pain  Hypokalemia    ED Discharge Orders    None       Antony Madura, PA-C 09/09/17 1610    Gilda Crease, MD 09/09/17 [phone]

## 2017-09-09 NOTE — ED Notes (Signed)
Patient left her sams[employer] phone given to security

## 2017-09-09 NOTE — ED Notes (Signed)
C/o abd pain onset 2 weeks ago states she is having problems keeping anything down and also having diarrhea

## 2017-09-09 NOTE — ED Provider Notes (Signed)
[facility]  Refer to provider note for full history and physical examination.  Briefly, patient is a 27 year old female with a history of hypokalemia who presents to the ED for evaluation of abdominal pain with nausea and vomiting for 2 weeks.  She has been hospitalized for dehydration in the past and discontinued her oral potassium tablets over 1 month ago.  Found to be hypokalemic at 2.1 but upon chart review this appears to be chronic and her potassium has never been above 3.0.  She was given oral  potassium rehydration and is currently being given IV potassium rehydration.  Will recheck potassium level and also p.o. challenge the patient.  If potassium has improved to at least 2.5 and patient is tolerating p.o. she will be likely stable for discharge home. Physical Exam  BP 102/62 (BP Location: Right Arm)   Pulse 72   Temp 98.3 F (36.8 C) (Oral)   Resp 18   Ht 5' (1.524 m)   Wt 40.8 kg (90 lb)   LMP 09/01/2017   SpO2 100%   BMI 17.58 kg/m   Physical Exam  Constitutional: She appears well-developed and well-nourished. No distress.  Extremely thin, resting comfortably in bed  HENT:  Head: Normocephalic and atraumatic.  Eyes: Conjunctivae are normal. Right eye exhibits no discharge. Left eye exhibits no discharge.  Neck: No JVD present. No tracheal deviation present.  Cardiovascular: Normal rate, regular rhythm and normal heart sounds.  Pulmonary/Chest: Effort normal and breath sounds normal.  Abdominal: Soft. Bowel sounds are normal. She exhibits no distension. There is no tenderness.  Abdomen currently nontender  Musculoskeletal: She exhibits no edema.  Neurological: She is alert.  Skin: Skin is warm and dry. No erythema.  Psychiatric: She has a normal mood and affect. Her behavior is normal.  Nursing note and vitals reviewed.   ED Course/Procedures     Procedures  MDM  Repeat potassium 2.6.  On reevaluation, patient is resting comfortably.   She states that she is feeling somewhat better.  She has had sips of fluid which she has tolerated although she has had a few episodes of diarrhea.  She states this is chronic and unchanged for her.  She is interested in following up with a gastroenterologist.  She feels comfortable with discharge home.  We will provide p.o. potassium prescription to take home.  Asked patient if she would like counseling resources for her disordered eating and she declined.  Discussed indications for return to the ED. Pt and friend verbalized understanding of and agreement with plan and is safe for discharge home at this time.       Jeanie SewerFawze, Denean Pavon A, PA-C 09/09/17 1011    Gilda CreasePollina, Christopher J, MD 09/17/17 [id number]

## 2017-10-13 ENCOUNTER — Other Ambulatory Visit: Payer: Self-pay

## 2017-10-13 ENCOUNTER — Ambulatory Visit (HOSPITAL_COMMUNITY)
Admission: EM | Admit: 2017-10-13 | Discharge: 2017-10-13 | Disposition: A | Discharge status: Home / Self Care | Payer: Self-pay | Attending: Emergency Medicine | Admitting: Emergency Medicine

## 2017-10-13 ENCOUNTER — Encounter (HOSPITAL_COMMUNITY): Payer: Self-pay | Admitting: Emergency Medicine

## 2017-10-13 DIAGNOSIS — J069 Acute upper respiratory infection, unspecified: Secondary | ICD-10-CM

## 2017-10-13 DIAGNOSIS — B9789 Other viral agents as the cause of diseases classified elsewhere: Secondary | ICD-10-CM

## 2017-10-13 MED ORDER — DM-GUAIFENESIN ER 30-600 MG PO TB12: 1.0000 | ORAL_TABLET | Freq: Two times a day (BID) | ORAL | 0 refills | Status: DC

## 2017-10-13 NOTE — ED Triage Notes (Signed)
Patient sick since Thursday.  Vomiting, diarrhea initially.  Now has stuffy nose, feeling weak, sneezing, chest phlegm

## 2017-10-13 NOTE — ED Provider Notes (Signed)
[facility]    CSN: [id number] Arrival date & time: 10/13/17  1427     History   Chief Complaint Chief Complaint  Patient presents with  . Cough    HPI Sara Hill is a 27 y.o. female.   Sara Hill presents with complaints of cough and nasal congestion which started 2-3 days ago. She states she originally had nausea, vomiting and diarrhea and went to the ER for evaluation of this. Vomiting has since subsided and diarrhea has been gradually improving. She now has productive cough of mucus, sneezing, chills. No known ill contacts. No known fevers. Denies abdominal pain. Has not taken any medications today. She took advil last night as well as nyquil which minimally helped. She only occasionally smokes. History of hypokalemia, takes potassium daily. Does not follow with GI due to lack of insurance.    ROS per HPI.       Past Medical History:  Diagnosis Date  . Gall stones   . Low blood potassium     There are no active problems to display for this patient.   Past Surgical History:  Procedure Laterality Date  . CHOLECYSTECTOMY      OB History    No data available       Home Medications    Prior to Admission medications   Medication Sig Start Date End Date Taking? Authorizing Provider  dextromethorphan-guaiFENesin (MUCINEX DM) 30-600 MG 12hr tablet Take 1 tablet by mouth 2 (two) times daily. 10/13/17   Georgetta HaberBurky, Natalie B, NP  potassium chloride (K-DUR) 10 MEQ tablet Take 1 tablet (10 mEq total) by mouth 2 (two) times daily for 7 days. 09/09/17 09/16/17  Michela PitcherFawze, Mina A, PA-C  potassium chloride SA (K-DUR,KLOR-CON) 20 MEQ tablet Take 1 po BID x 7 days then once a day Patient not taking: Reported on 09/09/2017 07/27/15   Devoria AlbeKnapp, Iva, MD    Family History Family History  Problem Relation Age of Onset  . Healthy Mother     Social History Social History   Tobacco Use  . Smoking status: Light Tobacco Smoker    Packs/day: 1.00    Types: Cigarettes    . Smokeless tobacco: Never Used  Substance Use Topics  . Alcohol use: No  . Drug use: No     Allergies   Benadryl [diphenhydramine hcl]   Review of Systems Review of Systems   Physical Exam Triage Vital Signs ED Triage Vitals  Enc Vitals Group     BP 10/13/17 1559 99/61     Pulse Rate 10/13/17 1559 88     Resp 10/13/17 1559 18     Temp 10/13/17 1559 99.1 F (37.3 C)     Temp Source 10/13/17 1559 Oral     SpO2 10/13/17 1559 100 %     Weight --      Height --      Head Circumference --      Peak Flow --      Pain Score 10/13/17 1557 8     Pain Loc --      Pain Edu? --      Excl. in GC? --    No data found.  Updated Vital Signs BP 99/61 (BP Location: Left Arm)   Pulse 88   Temp 99.1 F (37.3 C) (Oral)   Resp 18   LMP 09/24/2017   SpO2 100%   Visual Acuity Right Eye Distance:   Left Eye Distance:   Bilateral Distance:    Right Eye  Near:   Left Eye Near:    Bilateral Near:     Physical Exam  Constitutional: She is oriented to person, place, and time. She appears well-developed and well-nourished. No distress.  HENT:  Head: Normocephalic and atraumatic.  Right Ear: Tympanic membrane, external ear and ear canal normal.  Left Ear: Tympanic membrane, external ear and ear canal normal.  Nose: Nose normal.  Mouth/Throat: Uvula is midline, oropharynx is clear and moist and mucous membranes are normal. No tonsillar exudate.  Eyes: Conjunctivae and EOM are normal. Pupils are equal, round, and reactive to light.  Cardiovascular: Normal rate, regular rhythm and normal heart sounds.  Pulmonary/Chest: Effort normal and breath sounds normal.  Abdominal: Soft. She exhibits no distension. There is no tenderness.  Neurological: She is alert and oriented to person, place, and time.  Skin: Skin is warm and dry.     UC Treatments / Results  Labs (all labs ordered are listed, but only abnormal results are displayed) Labs Reviewed - No data to display  EKG  EKG  Interpretation None       Radiology No results found.  Procedures Procedures (including critical care time)  Medications Ordered in UC Medications - No data to display   Initial Impression / Assessment and Plan / UC Course  I have reviewed the triage vital signs and the nursing notes.  Pertinent labs & imaging results that were available during my care of the patient were reviewed by me and considered in my medical decision making (see chart for details).     Benign physical exam at this time. Non toxic in appearance. Labs completed in ER 1/29, patient taking potassium regularly and vomiting has resolved. History and physical consistent with viral illness.  Supportive cares recommended. Return precautions provided. Patient verbalized understanding and agreeable to plan.    Final Clinical Impressions(s) / UC Diagnoses   Final diagnoses:  Viral URI with cough    ED Discharge Orders        Ordered    dextromethorphan-guaiFENesin (MUCINEX DM) 30-600 MG 12hr tablet  2 times daily     10/13/17 1713       Controlled Substance Prescriptions [place] Controlled Substance Registry consulted? Not Applicable   Georgetta Haber, NP 10/13/17 1719

## 2017-10-13 NOTE — Discharge Instructions (Signed)
Push fluids to ensure adequate hydration and keep secretions thin.  Tylenol and/or ibuprofen as needed for pain or fevers.  Mucinex d twice a day to help with cough. If symptoms worsen or do not improve in the next week to return to be seen or to follow up with your PCP.

## 2018-01-10 HISTORY — PX: ESOPHAGOGASTRODUODENOSCOPY: SHX1529

## 2018-01-10 HISTORY — PX: COLONOSCOPY: SHX174

## 2018-06-27 ENCOUNTER — Inpatient Hospital Stay
Admission: EM | Admit: 2018-06-27 | Discharge: 2018-06-29 | DRG: 640 | Disposition: A | Discharge status: Home / Self Care | Payer: Self-pay | Attending: Internal Medicine | Admitting: Internal Medicine

## 2018-06-27 ENCOUNTER — Emergency Department: Payer: Self-pay

## 2018-06-27 ENCOUNTER — Other Ambulatory Visit: Payer: Self-pay

## 2018-06-27 ENCOUNTER — Inpatient Hospital Stay: Payer: Self-pay

## 2018-06-27 DIAGNOSIS — K909 Intestinal malabsorption, unspecified: Secondary | ICD-10-CM | POA: Diagnosis present

## 2018-06-27 DIAGNOSIS — E876 Hypokalemia: Principal | ICD-10-CM

## 2018-06-27 DIAGNOSIS — K529 Noninfective gastroenteritis and colitis, unspecified: Secondary | ICD-10-CM

## 2018-06-27 DIAGNOSIS — R2242 Localized swelling, mass and lump, left lower limb: Secondary | ICD-10-CM | POA: Diagnosis present

## 2018-06-27 DIAGNOSIS — Z9114 Patient's other noncompliance with medication regimen: Secondary | ICD-10-CM

## 2018-06-27 DIAGNOSIS — E43 Unspecified severe protein-calorie malnutrition: Secondary | ICD-10-CM | POA: Diagnosis present

## 2018-06-27 DIAGNOSIS — Z833 Family history of diabetes mellitus: Secondary | ICD-10-CM

## 2018-06-27 DIAGNOSIS — Z681 Body mass index (BMI) 19 or less, adult: Secondary | ICD-10-CM

## 2018-06-27 DIAGNOSIS — B079 Viral wart, unspecified: Secondary | ICD-10-CM | POA: Diagnosis present

## 2018-06-27 LAB — COMPREHENSIVE METABOLIC PANEL
ALK PHOS: 71 U/L (ref 38–126)
ALT: 25 U/L (ref 0–44)
ANION GAP: 10 (ref 5–15)
AST: 26 U/L (ref 15–41)
Albumin: 4.7 g/dL (ref 3.5–5.0)
BUN: 5 mg/dL — ABNORMAL LOW (ref 6–20)
CO2: 24 mmol/L (ref 22–32)
Calcium: 9.3 mg/dL (ref 8.9–10.3)
Chloride: 106 mmol/L (ref 98–111)
Creatinine, Ser: 0.35 mg/dL — ABNORMAL LOW (ref 0.44–1.00)
Glucose, Bld: 103 mg/dL — ABNORMAL HIGH (ref 70–99)
Potassium: 2.1 mmol/L — CL (ref 3.5–5.1)
SODIUM: 140 mmol/L (ref 135–145)
Total Bilirubin: 1.8 mg/dL — ABNORMAL HIGH (ref 0.3–1.2)
Total Protein: 8.1 g/dL (ref 6.5–8.1)

## 2018-06-27 LAB — URINALYSIS, COMPLETE (UACMP) WITH MICROSCOPIC
Bacteria, UA: NONE SEEN
Bilirubin Urine: NEGATIVE
Glucose, UA: NEGATIVE mg/dL
KETONES UR: NEGATIVE mg/dL
Nitrite: NEGATIVE
Protein, ur: NEGATIVE mg/dL
Specific Gravity, Urine: 1.003 — ABNORMAL LOW (ref 1.005–1.030)
pH: 8 (ref 5.0–8.0)

## 2018-06-27 LAB — CBC
HCT: 38.7 % (ref 36.0–46.0)
Hemoglobin: 13.8 g/dL (ref 12.0–15.0)
MCH: 28.9 pg (ref 26.0–34.0)
MCHC: 35.7 g/dL (ref 30.0–36.0)
MCV: 81 fL (ref 80.0–100.0)
Platelets: 255 10*3/uL (ref 150–400)
RBC: 4.78 MIL/uL (ref 3.87–5.11)
RDW: 14.9 % (ref 11.5–15.5)
WBC: 6.2 10*3/uL (ref 4.0–10.5)
nRBC: 0 % (ref 0.0–0.2)

## 2018-06-27 LAB — URINE DRUG SCREEN, QUALITATIVE (ARMC ONLY)
AMPHETAMINES, UR SCREEN: NOT DETECTED
Barbiturates, Ur Screen: NOT DETECTED
Benzodiazepine, Ur Scrn: NOT DETECTED
COCAINE METABOLITE, UR ~~LOC~~: NOT DETECTED
Cannabinoid 50 Ng, Ur ~~LOC~~: POSITIVE — AB
MDMA (Ecstasy)Ur Screen: NOT DETECTED
Methadone Scn, Ur: NOT DETECTED
OPIATE, UR SCREEN: NOT DETECTED
Phencyclidine (PCP) Ur S: NOT DETECTED
TRICYCLIC, UR SCREEN: NOT DETECTED

## 2018-06-27 LAB — LIPASE, BLOOD: Lipase: 33 U/L (ref 11–51)

## 2018-06-27 LAB — MAGNESIUM: Magnesium: 1.8 mg/dL (ref 1.7–2.4)

## 2018-06-27 LAB — POCT PREGNANCY, URINE: PREG TEST UR: NEGATIVE

## 2018-06-27 MED ORDER — POTASSIUM CHLORIDE CRYS ER 20 MEQ PO TBCR
40.0000 meq | EXTENDED_RELEASE_TABLET | Freq: Two times a day (BID) | ORAL | Status: DC
  Administered 2018-06-27: 40 meq via ORAL
  Filled 2018-06-27: qty 2

## 2018-06-27 MED ORDER — ONDANSETRON HCL 4 MG/2ML IJ SOLN
4.0000 mg | Freq: Once | INTRAMUSCULAR | Status: AC
  Administered 2018-06-27: 4 mg via INTRAVENOUS
  Filled 2018-06-27: qty 2

## 2018-06-27 MED ORDER — HYDROMORPHONE HCL 1 MG/ML IJ SOLN
0.5000 mg | Freq: Once | INTRAMUSCULAR | Status: AC
  Administered 2018-06-27: 0.5 mg via INTRAVENOUS
  Filled 2018-06-27: qty 1

## 2018-06-27 MED ORDER — ACETAMINOPHEN 325 MG PO TABS
650.0000 mg | ORAL_TABLET | Freq: Four times a day (QID) | ORAL | Status: DC | PRN
  Administered 2018-06-27 – 2018-06-28 (×2): 650 mg via ORAL
  Filled 2018-06-27 (×2): qty 2

## 2018-06-27 MED ORDER — POTASSIUM CHLORIDE IN NACL 40-0.9 MEQ/L-% IV SOLN
INTRAVENOUS | Status: DC
  Administered 2018-06-28: 25 mL/h via INTRAVENOUS
  Filled 2018-06-27 (×3): qty 1000

## 2018-06-27 MED ORDER — MAGNESIUM SULFATE IN D5W 1-5 GM/100ML-% IV SOLN
1.0000 g | Freq: Once | INTRAVENOUS | Status: AC
  Administered 2018-06-27: 1 g via INTRAVENOUS
  Filled 2018-06-27: qty 100

## 2018-06-27 MED ORDER — URSODIOL 300 MG PO CAPS
300.0000 mg | ORAL_CAPSULE | Freq: Two times a day (BID) | ORAL | Status: DC
  Administered 2018-06-27 – 2018-06-29 (×4): 300 mg via ORAL
  Filled 2018-06-27 (×5): qty 1

## 2018-06-27 MED ORDER — ONDANSETRON HCL 4 MG/2ML IJ SOLN: 4.0000 mg | Freq: Four times a day (QID) | INTRAMUSCULAR | Status: DC | PRN

## 2018-06-27 MED ORDER — POTASSIUM CHLORIDE CRYS ER 20 MEQ PO TBCR
40.0000 meq | EXTENDED_RELEASE_TABLET | Freq: Once | ORAL | Status: AC
  Administered 2018-06-27: 40 meq via ORAL
  Filled 2018-06-27: qty 2

## 2018-06-27 MED ORDER — ACETAMINOPHEN 650 MG RE SUPP: 650.0000 mg | Freq: Four times a day (QID) | RECTAL | Status: DC | PRN

## 2018-06-27 MED ORDER — ONDANSETRON HCL 4 MG PO TABS: 4.0000 mg | ORAL_TABLET | Freq: Four times a day (QID) | ORAL | Status: DC | PRN

## 2018-06-27 MED ORDER — IOHEXOL 300 MG/ML  SOLN
50.0000 mL | Freq: Once | INTRAMUSCULAR | Status: AC | PRN
  Administered 2018-06-27: 50 mL via INTRAVENOUS

## 2018-06-27 MED ORDER — MAGNESIUM SULFATE IN D5W 1-5 GM/100ML-% IV SOLN
1.0000 g | Freq: Once | INTRAVENOUS | Status: DC
  Filled 2018-06-27: qty 100

## 2018-06-27 MED ORDER — KCL IN DEXTROSE-NACL 40-5-0.45 MEQ/L-%-% IV SOLN
INTRAVENOUS | Status: DC
  Administered 2018-06-27: 17:00:00 via INTRAVENOUS
  Filled 2018-06-27 (×6): qty 1000

## 2018-06-27 MED ORDER — POTASSIUM CHLORIDE 10 MEQ/100ML IV SOLN
10.0000 meq | INTRAVENOUS | Status: DC
  Administered 2018-06-27: 10 meq via INTRAVENOUS
  Filled 2018-06-27 (×4): qty 100

## 2018-06-27 MED ORDER — ENOXAPARIN SODIUM 30 MG/0.3ML ~~LOC~~ SOLN
30.0000 mg | SUBCUTANEOUS | Status: DC
  Administered 2018-06-27: 30 mg via SUBCUTANEOUS
  Filled 2018-06-27 (×2): qty 0.3

## 2018-06-27 MED ORDER — POTASSIUM CHLORIDE CRYS ER 20 MEQ PO TBCR: 20.0000 meq | EXTENDED_RELEASE_TABLET | Freq: Two times a day (BID) | ORAL | Status: DC

## 2018-06-27 NOTE — ED Notes (Signed)
First RN notified of results as well as MD

## 2018-06-27 NOTE — ED Notes (Signed)
Date and time results received: 06/27/18 1554 (use smartphrase ".now" to insert current time)  Test: potassium Critical Value: 2.1  Name of Provider Notified: williams

## 2018-06-27 NOTE — ED Notes (Addendum)
Swelling to sole of left foot

## 2018-06-27 NOTE — H&P (Addendum)
[facility] [place] at [facility]   PATIENT NAME: Sara Hill    MR#:  [id number]  DATE OF BIRTH:  1991-01-14  DATE OF ADMISSION:  06/27/2018  PRIMARY CARE PHYSICIAN: Patient, No Pcp Per   REQUESTING/REFERRING PHYSICIAN: Dr. Daryel November  CHIEF COMPLAINT:   Chief Complaint  Patient presents with  . Abdominal Pain  . Foot Pain    HISTORY OF PRESENT ILLNESS:  Sara Hill  is a 27 y.o. 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 Very malnourished appearing patient in bed states that she has chronic diarrhea after eating or drinking going on for almost 6-7 years since her gallbladder surgery.  Denies any fevers or chills.  Has lost quite a bit of weight because of all this.  Last admission was at [facility] 5 months ago with similar presentation.  Treated for hypokalemia, EGD and colonoscopy were normal at the time including biopsies.  She does not know what triggers her abdominal pain, but it worsens with diarrhea.  She also has a worsening increasing in size wart on her left foot sole that is painful now.  Labs indicated potassium of 2.1 here.  Being admitted for hypokalemia and abdominal pain.  PAST MEDICAL HISTORY:   Past Medical History:  Diagnosis Date  . Gall stones   . Low blood potassium     PAST SURGICAL HISTORY:   Past Surgical History:  Procedure Laterality Date  . CHOLECYSTECTOMY    . COLONOSCOPY  01/2018   normal  . ESOPHAGOGASTRODUODENOSCOPY  01/2018   normal    SOCIAL HISTORY:   Social History   Tobacco Use  . Smoking status: Never Smoker  . Smokeless tobacco: Never Used  Substance Use Topics  . Alcohol use: No    FAMILY HISTORY:   Family History  Problem Relation Age of Onset  . Healthy Mother   . Diabetes Mother     DRUG ALLERGIES:   Allergies   Allergen Reactions  . Benadryl [Diphenhydramine Hcl] Swelling    Pt reeports "feels like throat swelling".     REVIEW OF SYSTEMS:   Review of Systems  Constitutional: Positive for malaise/fatigue and weight loss. Negative for chills and fever.  HENT: Negative for ear discharge, ear pain, hearing loss and nosebleeds.   Eyes: Negative for blurred vision, double vision and photophobia.  Respiratory: Negative for cough, hemoptysis, shortness of breath and wheezing.   Cardiovascular: Negative for chest pain, palpitations, orthopnea and leg swelling.  Gastrointestinal: Positive for abdominal pain and diarrhea. Negative for constipation, heartburn, melena, nausea and vomiting.  Genitourinary: Negative for dysuria, frequency, hematuria and urgency.  Musculoskeletal: Negative for back pain, myalgias and neck pain.  Skin: Negative for rash.  Neurological: Negative for dizziness, tingling, tremors, sensory change, speech change, focal weakness and headaches.  Endo/Heme/Allergies: Does not bruise/bleed easily.  Psychiatric/Behavioral: Negative for depression.    MEDICATIONS AT HOME:   Prior to Admission medications   Medication Sig Start Date End Date Taking? Authorizing Provider  dextromethorphan-guaiFENesin (MUCINEX DM) 30-600 MG 12hr tablet Take 1 tablet by mouth 2 (two) times daily. 10/13/17   Georgetta Haber, NP  potassium chloride (K-DUR) 10 MEQ tablet Take 1 tablet (10 mEq total) by mouth 2 (two) times daily for 7 days. 09/09/17 09/16/17  Michela Pitcher A, PA-C  potassium chloride SA (K-DUR,KLOR-CON) 20 MEQ tablet Take 1 po BID x  7 days then once a day Patient not taking: Reported on 09/09/2017 07/27/15   Devoria Albe, MD      VITAL SIGNS:  Blood pressure 105/73, pulse 63, temperature 98 F (36.7 C), temperature source Oral, resp. rate 15, height 5' (1.524 m), weight 34.5 kg, last menstrual period 06/13/2018, SpO2 98 %.  PHYSICAL EXAMINATION:   Physical Exam  GENERAL:  27 y.o.-year-old  severely malnourished patient lying in the bed with no acute distress.  EYES: Pupils equal, round, reactive to light and accommodation. No scleral icterus. Extraocular muscles intact.  HEENT: Head atraumatic, normocephalic. Oropharynx and nasopharynx clear.  NECK:  Supple, no jugular venous distention. No thyroid enlargement, no tenderness.  LUNGS: Normal breath sounds bilaterally, no wheezing, rales,rhonchi or crepitation. No use of accessory muscles of respiration.  CARDIOVASCULAR: S1, S2 normal. No murmurs, rubs, or gallops.  ABDOMEN: Soft, tender in the epigastric and both flanks without any voluntary guarding or rigidity,, nondistended. Bowel sounds present. No organomegaly or mass.  EXTREMITIES: No pedal edema, cyanosis, or clubbing.  Distal to the heel of left foot on the sole, there is a thick 4 x 6 cm forearm wart which is tender to touch. NEUROLOGIC: Cranial nerves II through XII are intact. Muscle strength 5/5 in all extremities. Sensation intact. Gait not checked.  PSYCHIATRIC: The patient is alert and oriented x 3.  SKIN: No obvious rash, lesion, or ulcer.   LABORATORY PANEL:   CBC Recent Labs  Lab 06/27/18 1529  WBC 6.2  HGB 13.8  HCT 38.7  PLT 255   ------------------------------------------------------------------------------------------------------------------  Chemistries  Recent Labs  Lab 06/27/18 1529  NA 140  K 2.1*  CL 106  CO2 24  GLUCOSE 103*  BUN 5*  CREATININE 0.35*  CALCIUM 9.3  MG 1.8  AST 26  ALT 25  ALKPHOS 71  BILITOT 1.8*   ------------------------------------------------------------------------------------------------------------------  Cardiac Enzymes No results for input(s): TROPONINI in the last 168 hours. ------------------------------------------------------------------------------------------------------------------  RADIOLOGY:  Dg Foot Complete Left  Result Date: 06/27/2018 CLINICAL DATA:  Painful mass in the plantar aspect  of the left heel with that has been present for the past 4 years. EXAM: LEFT FOOT - COMPLETE 3+ VIEW COMPARISON:  None. FINDINGS: Moderately large superficial mass in the plantar aspect of the right foot, centered in the skin. The bones have normal appearances. IMPRESSION: Moderately large superficial mass in the plantar aspect of the right foot, centered in the skin, without underlying bony abnormality. Electronically Signed   By: Beckie Salts M.D.   On: 06/27/2018 17:08    EKG:   Orders placed or performed during the hospital encounter of 06/27/18  . ED EKG  . ED EKG  . EKG 12-Lead  . EKG 12-Lead    IMPRESSION AND PLAN:   Sara Hill  is a 27 y.o. 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  1.  Severe hypokalemia-secondary to GI losses from chronic diarrhea -Noncompliant with oral supplements at home.  Admit, monitor on telemetry -IV and oral supplements given. -Magnesium is 1.8, replacements ordered.  2.  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  Can add pancreatic supplements if needed. -Outpatient GI follow-up -EGD and colonoscopy from outside hospital normal. -Add Bentyl for pain. -Stool studies are pending at this time -CT of the abdomen is done and pending at this time. -Check HIV status  3.  Left foot  wart-painful and increasing in size.  Will get podiatry consult  4.  Severe malnutrition-dietitian consult  5.  DVT prophylaxis-Lovenox    All the records are reviewed and case discussed with ED provider. Management plans discussed with the patient, family and they are in agreement.  CODE STATUS: Full Code  TOTAL TIME TAKING CARE OF THIS PATIENT: 50 minutes.    Enid BaasKALISETTI,Farris Blash M.D on 06/27/2018 at 7:27 PM  Between 7am to 6pm - Pager - [phone]  After 6pm go  to [email or web address] - password [email or web address]  [facility] [place]  Office  [phone]  CC: Primary care physician; Patient, No Pcp Per

## 2018-06-27 NOTE — ED Provider Notes (Signed)
[facility] Emergency Department Provider Note       Time seen: ----------------------------------------- 4:10 PM on 06/27/2018 -----------------------------------------   I have reviewed the triage vital signs and the nursing notes.  HISTORY   Chief Complaint Abdominal Pain and Foot Pain    HPI Sherril Buikema is a 27 y.o. female with a history of gallstones and low potassium who presents to the ED for weakness as well as persistent diarrhea, weight loss and nausea.  She typically has a chronic left-sided abdominal pain.  Patient states this happens often, she is been admitted in the hospital in the past for hypokalemia.  She reports she was seen by a gastroenterologist who did a colonoscopy at some point this past year.  Past Medical History:  Diagnosis Date  . Gall stones   . Low blood potassium     There are no active problems to display for this patient.   Past Surgical History:  Procedure Laterality Date  . CHOLECYSTECTOMY      Allergies Benadryl [diphenhydramine hcl]  Social History Social History   Tobacco Use  . Smoking status: Light Tobacco Smoker    Packs/day: 1.00    Types: Cigarettes  . Smokeless tobacco: Never Used  Substance Use Topics  . Alcohol use: No  . Drug use: No   Review of Systems Constitutional: Negative for fever.  Positive for weight loss Cardiovascular: Negative for chest pain. Respiratory: Negative for shortness of breath. Gastrointestinal: Positive for nausea, diarrhea Musculoskeletal: Positive for left foot pain Skin: Negative for rash. Neurological: Negative for headaches, positive for weakness  All systems negative/normal/unremarkable except as stated in the HPI  ____________________________________________   PHYSICAL EXAM:  VITAL SIGNS: ED Triage Vitals  Enc Vitals Group     BP 06/27/18 1524 98/63     Pulse Rate 06/27/18 1524 83     Resp 06/27/18 1524 16     Temp 06/27/18 1524 98 F  (36.7 C)     Temp Source 06/27/18 1524 Oral     SpO2 06/27/18 1524 100 %     Weight 06/27/18 1524 76 lb (34.5 kg)     Height 06/27/18 1524 5' (1.524 m)     Head Circumference --      Peak Flow --      Pain Score 06/27/18 1527 10     Pain Loc --      Pain Edu? --      Excl. in GC? --    Constitutional: Alert and oriented.  Thin appearing, no distress Eyes: Conjunctivae are normal. Normal extraocular movements. ENT   Head: Normocephalic and atraumatic.   Nose: No congestion/rhinnorhea.   Mouth/Throat: Mucous membranes are moist.   Neck: No stridor. Cardiovascular: Normal rate, regular rhythm. No murmurs, rubs, or gallops. Respiratory: Normal respiratory effort without tachypnea nor retractions. Breath sounds are clear and equal bilaterally. No wheezes/rales/rhonchi. Gastrointestinal: Soft and nontender. Normal bowel sounds Musculoskeletal: Tenderness on the plantar surface of the left foot, there is a large growth over the plantar surface around the heel, most resembling a large keloid Neurologic:  Normal speech and language. No gross focal neurologic deficits are appreciated.  Skin:  Skin is warm, dry and intact. No rash noted. Psychiatric: Mood and affect are normal.  ____________________________________________  EKG: Interpreted by me.  Sinus rhythm rate of 69 bpm, nonspecific T wave changes, normal axis.  ____________________________________________  ED COURSE:  As part of my medical decision making, I reviewed the following data within the electronic medical  record:  History obtained from family if available, nursing notes, old chart and ekg, as well as notes from prior ED visits. Patient presented for nausea, diarrhea and reported hypokalemia, we will assess with labs and imaging as indicated at this time.   Procedures ____________________________________________   LABS (pertinent positives/negatives)  Labs Reviewed  COMPREHENSIVE METABOLIC PANEL -  Abnormal; Notable for the following components:      Result Value   Potassium 2.1 (*)    Glucose, Bld 103 (*)    BUN 5 (*)    Creatinine, Ser 0.35 (*)    Total Bilirubin 1.8 (*)    All other components within normal limits  GASTROINTESTINAL PANEL BY PCR, STOOL (REPLACES STOOL CULTURE)  C DIFFICILE QUICK SCREEN W PCR REFLEX  LIPASE, BLOOD  CBC  MAGNESIUM  URINALYSIS, COMPLETE (UACMP) WITH MICROSCOPIC  POC URINE PREG, ED   ___________________________________________  DIFFERENTIAL DIAGNOSIS   Dehydration, electrolyte abnormality, irritable bowel syndrome, inflammatory bowel disorder  FINAL ASSESSMENT AND PLAN  Hypokalemia, weight loss, diarrhea   Plan: The patient had presented for nausea and diarrhea. Patient's labs do indicate severe hypokalemia.  Patient's clinical exam is concerning because she weighs 75 pounds and seems to steadily lose weight due to the persistent diarrhea.  Stool studies are still outstanding.  Patient was repleted with oral and IV potassium here.  CT the abdomen pelvis was performed.  I will discuss with the hospitalist for admission.   Ulice DashJohnathan E [employer], MD   Note: This note was generated in part or whole with voice recognition software. Voice recognition is usually quite accurate but there are transcription errors that can and very often do occur. I apologize for any typographical errors that were not detected and corrected.     Emily FilbertWilliams, Eboni Coval E, MD 06/27/18 [phone]

## 2018-06-27 NOTE — ED Triage Notes (Signed)
States L foot pain (friend states she thinks it's a keloid) and LLQ abd pain. States nausea and diarrhea. When asked how long this has been going on she states "It happens often." friend states hx of hypokalemia. A&O, in wheelchair.

## 2018-06-28 ENCOUNTER — Other Ambulatory Visit: Payer: Self-pay

## 2018-06-28 ENCOUNTER — Inpatient Hospital Stay: Payer: Self-pay

## 2018-06-28 LAB — GASTROINTESTINAL PANEL BY PCR, STOOL (REPLACES STOOL CULTURE)
Adenovirus F40/41: NOT DETECTED
Astrovirus: NOT DETECTED
CAMPYLOBACTER SPECIES: NOT DETECTED
Cryptosporidium: NOT DETECTED
Cyclospora cayetanensis: NOT DETECTED
ENTEROPATHOGENIC E COLI (EPEC): NOT DETECTED
ENTEROTOXIGENIC E COLI (ETEC): NOT DETECTED
Entamoeba histolytica: NOT DETECTED
Enteroaggregative E coli (EAEC): NOT DETECTED
GIARDIA LAMBLIA: NOT DETECTED
Norovirus GI/GII: NOT DETECTED
PLESIMONAS SHIGELLOIDES: NOT DETECTED
ROTAVIRUS A: NOT DETECTED
SALMONELLA SPECIES: NOT DETECTED
SHIGELLA/ENTEROINVASIVE E COLI (EIEC): NOT DETECTED
Sapovirus (I, II, IV, and V): NOT DETECTED
Shiga like toxin producing E coli (STEC): NOT DETECTED
VIBRIO SPECIES: NOT DETECTED
Vibrio cholerae: NOT DETECTED
YERSINIA ENTEROCOLITICA: NOT DETECTED

## 2018-06-28 LAB — BASIC METABOLIC PANEL
Anion gap: 6 (ref 5–15)
CALCIUM: 9 mg/dL (ref 8.9–10.3)
CO2: 26 mmol/L (ref 22–32)
CREATININE: 0.33 mg/dL — AB (ref 0.44–1.00)
Chloride: 110 mmol/L (ref 98–111)
GFR calc Af Amer: >60 mL/min (ref >60)
GLUCOSE: 104 mg/dL — AB (ref 70–99)
Potassium: 3 mmol/L — ABNORMAL LOW (ref 3.5–5.1)
SODIUM: 142 mmol/L (ref 135–145)

## 2018-06-28 LAB — CBC
HCT: 36.8 % (ref 36.0–46.0)
HEMOGLOBIN: 12.8 g/dL (ref 12.0–15.0)
MCH: 28.6 pg (ref 26.0–34.0)
MCHC: 34.8 g/dL (ref 30.0–36.0)
MCV: 82.3 fL (ref 80.0–100.0)
Platelets: 239 10*3/uL (ref 150–400)
RBC: 4.47 MIL/uL (ref 3.87–5.11)
RDW: 15.2 % (ref 11.5–15.5)
WBC: 4 10*3/uL (ref 4.0–10.5)
nRBC: 0 % (ref 0.0–0.2)

## 2018-06-28 LAB — C DIFFICILE QUICK SCREEN W PCR REFLEX
C DIFFICILE (CDIFF) INTERP: NOT DETECTED
C DIFFICLE (CDIFF) ANTIGEN: NEGATIVE
C Diff toxin: NEGATIVE

## 2018-06-28 MED ORDER — MORPHINE SULFATE (PF) 4 MG/ML IV SOLN
4.0000 mg | INTRAVENOUS | Status: DC | PRN
  Administered 2018-06-28 – 2018-06-29 (×4): 4 mg via INTRAVENOUS
  Filled 2018-06-28 (×4): qty 1

## 2018-06-28 MED ORDER — ADULT MULTIVITAMIN W/MINERALS CH
1.0000 | ORAL_TABLET | Freq: Every day | ORAL | Status: DC
  Administered 2018-06-28 – 2018-06-29 (×2): 1 via ORAL
  Filled 2018-06-28 (×2): qty 1

## 2018-06-28 MED ORDER — GADOBUTROL 1 MMOL/ML IV SOLN
3.0000 mL | Freq: Once | INTRAVENOUS | Status: AC | PRN
  Administered 2018-06-28: 3 mL via INTRAVENOUS

## 2018-06-28 MED ORDER — PANCRELIPASE (LIP-PROT-AMYL) 24000-76000 UNITS PO CPEP: 24000.0000 [IU] | ORAL_CAPSULE | Freq: Three times a day (TID) | ORAL | 0 refills | Status: DC

## 2018-06-28 MED ORDER — ENSURE ENLIVE PO LIQD
237.0000 mL | Freq: Three times a day (TID) | ORAL | Status: DC
  Administered 2018-06-28 – 2018-06-29 (×4): 237 mL via ORAL

## 2018-06-28 MED ORDER — POTASSIUM CHLORIDE CRYS ER 20 MEQ PO TBCR
40.0000 meq | EXTENDED_RELEASE_TABLET | ORAL | Status: AC
  Administered 2018-06-28 (×3): 40 meq via ORAL
  Filled 2018-06-28 (×3): qty 2

## 2018-06-28 MED ORDER — DICYCLOMINE HCL 10 MG PO CAPS: 10.0000 mg | ORAL_CAPSULE | Freq: Three times a day (TID) | ORAL | 0 refills | Status: DC

## 2018-06-28 MED ORDER — DICYCLOMINE HCL 10 MG PO CAPS
10.0000 mg | ORAL_CAPSULE | Freq: Three times a day (TID) | ORAL | Status: DC
  Administered 2018-06-28 – 2018-06-29 (×6): 10 mg via ORAL
  Filled 2018-06-28 (×7): qty 1

## 2018-06-28 MED ORDER — CALCIUM CARBONATE ANTACID 500 MG PO CHEW
1.0000 | CHEWABLE_TABLET | Freq: Three times a day (TID) | ORAL | Status: DC | PRN
  Administered 2018-06-28: 200 mg via ORAL
  Filled 2018-06-28: qty 1

## 2018-06-28 MED ORDER — POTASSIUM CHLORIDE CRYS ER 20 MEQ PO TBCR: 40.0000 meq | EXTENDED_RELEASE_TABLET | Freq: Two times a day (BID) | ORAL | 0 refills | Status: DC

## 2018-06-28 MED ORDER — PANCRELIPASE (LIP-PROT-AMYL) 12000-38000 UNITS PO CPEP
24000.0000 [IU] | ORAL_CAPSULE | Freq: Three times a day (TID) | ORAL | Status: DC
  Administered 2018-06-28 – 2018-06-29 (×4): 24000 [IU] via ORAL
  Filled 2018-06-28 (×5): qty 2

## 2018-06-28 MED ORDER — OXYCODONE HCL 5 MG PO TABS
5.0000 mg | ORAL_TABLET | Freq: Four times a day (QID) | ORAL | Status: DC | PRN
  Administered 2018-06-28 (×2): 5 mg via ORAL
  Filled 2018-06-28 (×2): qty 1

## 2018-06-28 MED ORDER — URSODIOL 300 MG PO CAPS: 300.0000 mg | ORAL_CAPSULE | Freq: Every day | ORAL | 0 refills | Status: DC

## 2018-06-28 MED ORDER — SODIUM CHLORIDE 0.9 % IV BOLUS
500.0000 mL | Freq: Once | INTRAVENOUS | Status: AC
  Administered 2018-06-28: 500 mL via INTRAVENOUS

## 2018-06-28 MED ORDER — ALUM & MAG HYDROXIDE-SIMETH 200-200-20 MG/5ML PO SUSP
30.0000 mL | Freq: Four times a day (QID) | ORAL | Status: DC | PRN
  Filled 2018-06-28: qty 30

## 2018-06-28 NOTE — Progress Notes (Signed)
[facility] - [place] at [facility]   PATIENT NAME: Sara Hill    MR#:  [id number]  DATE OF BIRTH:  [birth year]  SUBJECTIVE:  CHIEF COMPLAINT:   Chief Complaint  Patient presents with  . Abdominal Pain  . Foot Pain   Continues to have diarrhea, nausea, abdominal pain  REVIEW OF SYSTEMS:    Review of Systems  Constitutional: Positive for malaise/fatigue. Negative for chills and fever.  HENT: Negative for sore throat.   Eyes: Negative for blurred vision, double vision and pain.  Respiratory: Negative for cough, hemoptysis, shortness of breath and wheezing.   Cardiovascular: Negative for chest pain, palpitations, orthopnea and leg swelling.  Gastrointestinal: Positive for abdominal pain, diarrhea and nausea. Negative for constipation, heartburn and vomiting.  Genitourinary: Negative for dysuria and hematuria.  Musculoskeletal: Negative for back pain and joint pain.  Skin: Negative for rash.  Neurological: Positive for dizziness. Negative for sensory change, speech change, focal weakness and headaches.  Endo/Heme/Allergies: Does not bruise/bleed easily.  Psychiatric/Behavioral: Negative for depression. The patient is not nervous/anxious.     DRUG ALLERGIES:   Allergies  Allergen Reactions  . Benadryl [Diphenhydramine Hcl] Swelling    Pt reeports "feels like throat swelling".     VITALS:  Blood pressure 100/68, pulse 68, temperature 97.8 F (36.6 C), temperature source Oral, resp. rate 18, height 5' (1.524 m), weight 35.9 kg, last menstrual period 06/13/2018, SpO2 100 %.  PHYSICAL EXAMINATION:   Physical Exam  GENERAL:  27 y.o.-year-old patient lying in the bed with no acute distress.  EYES: Pupils equal, round, reactive to light and accommodation. No scleral icterus. Extraocular muscles intact.  HEENT: Head atraumatic, normocephalic. Oropharynx and nasopharynx clear.  NECK:  Supple, no jugular venous distention. No thyroid enlargement, no  tenderness.  LUNGS: Normal breath sounds bilaterally, no wheezing, rales, rhonchi. No use of accessory muscles of respiration.  CARDIOVASCULAR: S1, S2 normal. No murmurs, rubs, or gallops.  ABDOMEN: Soft, nontender, nondistended. Bowel sounds present. No organomegaly or mass.  EXTREMITIES: No cyanosis, clubbing or edema b/l.    Left foot mass NEUROLOGIC: Cranial nerves II through XII are intact. No focal Motor or sensory deficits b/l.   PSYCHIATRIC: The patient is alert and oriented x 3.  SKIN: No obvious rash, lesion, or ulcer.   LABORATORY PANEL:   CBC Recent Labs  Lab 06/28/18 0519  WBC 4.0  HGB 12.8  HCT 36.8  PLT 239   ------------------------------------------------------------------------------------------------------------------ Chemistries  Recent Labs  Lab 06/27/18 1529 06/28/18 0519  NA 140 142  K 2.1* 3.0*  CL 106 110  CO2 24 26  GLUCOSE 103* 104*  BUN 5* <5*  CREATININE 0.35* 0.33*  CALCIUM 9.3 9.0  MG 1.8  --   AST 26  --   ALT 25  --   ALKPHOS 71  --   BILITOT 1.8*  --    ------------------------------------------------------------------------------------------------------------------  Cardiac Enzymes No results for input(s): TROPONINI in the last 168 hours. ------------------------------------------------------------------------------------------------------------------  RADIOLOGY:  Ct Abdomen Pelvis W Contrast  Result Date: 06/27/2018 CLINICAL DATA:  Abdominal pain EXAM: CT ABDOMEN AND PELVIS WITH CONTRAST TECHNIQUE: Multidetector CT imaging of the abdomen and pelvis was performed using the standard protocol following bolus administration of intravenous contrast. CONTRAST:  50mL OMNIPAQUE IOHEXOL 300 MG/ML  SOLN COMPARISON:  CT 07/27/2015 FINDINGS: Lower chest: Lung bases demonstrate no acute consolidation or effusion. The heart size is normal. Hepatobiliary: Status post cholecystectomy. Mild intra and extrahepatic biliary enlargement. Common bile  duct diameter measures  up to 9 mm. Slightly heterogeneous enhancement of the right hepatic lobe but without focal abnormality. Pancreas: Unremarkable. No pancreatic ductal dilatation or surrounding inflammatory changes. Spleen: Normal in size without focal abnormality. Adrenals/Urinary Tract: Adrenal glands are normal. No hydronephrosis. Numerous intrarenal calculi bilaterally. Bladder within normal limits. Stomach/Bowel: Stomach is within normal limits. Appendix appears normal. No evidence of bowel wall thickening, distention, or inflammatory changes. Vascular/Lymphatic: No significant vascular findings are present. No enlarged abdominal or pelvic lymph nodes. Reproductive: Endometrial thickening with prominent endometrial enhancement. No adnexal mass. Other: Negative for free air or free fluid. Musculoskeletal: No acute or significant osseous findings. IMPRESSION: 1. No CT evidence for acute intra-abdominal or pelvic abnormality. 2. Status post cholecystectomy. Interval increase in intra and extrahepatic biliary enlargement likely due to postsurgical change however suggest correlation with LFTs. 3. Numerous intrarenal calculi bilaterally without hydronephrosis Electronically Signed   By: Jasmine PangKim  Fujinaga M.D.   On: 06/27/2018 20:06   Mr Ankle Left W Wo Contrast  Result Date: 06/28/2018 CLINICAL DATA:  Chronic plantar heel mass. EXAM: MRI OF THE LEFT ANKLE WITHOUT AND WITH CONTRAST TECHNIQUE: Multiplanar, multisequence MR imaging of the ankle was performed before and after the administration of intravenous contrast. CONTRAST:  3 mL Gadavist intravenous contrast. COMPARISON:  Left foot x-rays from yesterday. FINDINGS: TENDONS Peroneal: Peroneal longus tendon intact. Peroneal brevis intact. Posteromedial: Posterior tibial tendon intact. Flexor hallucis longus tendon intact. Flexor digitorum longus tendon intact. Anterior: Tibialis anterior tendon intact. Extensor hallucis longus tendon intact Extensor digitorum  longus tendon intact. Achilles:  Intact. Plantar Fascia: Intact. LIGAMENTS Lateral: Anterior talofibular ligament intact. Calcaneofibular ligament intact. Posterior talofibular ligament intact. Anterior and posterior tibiofibular ligaments intact. Medial: Deltoid ligament intact. Spring ligament intact. CARTILAGE Ankle Joint: No joint effusion. Normal ankle mortise. No chondral defect. Subtalar Joints/Sinus Tarsi: Normal subtalar joints. No subtalar joint effusion. Normal sinus tarsi. Bones: No marrow signal abnormality.  No fracture or dislocation. Soft Tissue: Along the plantar surface of the hindfoot, there is a large superficial skin mass measuring 4.9 x 4.9 x 1.3 cm (AP by transverse by CC). The mass is predominantly hypointense on T2 images, with peripheral nodular T2 hyperintensity, and heterogeneously T1 signal with areas of T1 hyperintensity. There is peripheral nodular enhancement. There is no extension into the subcutaneous fat. IMPRESSION: IMPRESSION 1. Large, peripherally enhancing 4.9 cm dermal mass of the plantar hindfoot, indeterminate. No extension into the subcutaneous fat. Given areas of internal T1 hyperintensity, melanoma is a possibility. The differential diagnosis also includes a fibrous tumor. Recommend biopsy and/or surgical excision. Electronically Signed   By: Obie DredgeWilliam T Derry M.D.   On: 06/28/2018 18:56   Dg Foot Complete Left  Result Date: 06/27/2018 CLINICAL DATA:  Painful mass in the plantar aspect of the left heel with that has been present for the past 4 years. EXAM: LEFT FOOT - COMPLETE 3+ VIEW COMPARISON:  None. FINDINGS: Moderately large superficial mass in the plantar aspect of the right foot, centered in the skin. The bones have normal appearances. IMPRESSION: Moderately large superficial mass in the plantar aspect of the right foot, centered in the skin, without underlying bony abnormality. Electronically Signed   By: Beckie SaltsSteven  Reid M.D.   On: 06/27/2018 17:08      ASSESSMENT AND PLAN:    Sara Hill  is a 27 y.o. 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  1.  Severe hypokalemia-secondary  to GI losses from chronic diarrhea -Noncompliant with oral supplements at home.   Replace IV and PO Check Labs in AM after replacememt  2.  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   Add Creon today -Outpatient GI follow-up -EGD and colonoscopy from outside hospital normal. -Added Bentyl for pain. -Stool studies are negative -CT of the abdomen - Nothing acute.  3.  Left foot mass-painful and increasing in size.  Will get podiatry consult. MRI pending  4.  Severe malnutrition-dietitian consult  All the records are reviewed and case discussed with Care Management/Social Workerr. Management plans discussed with the patient, family and they are in agreement.  CODE STATUS: FULL CODE  DVT Prophylaxis: SCDs  TOTAL TIME TAKING CARE OF THIS PATIENT: 35 minutes.   POSSIBLE D/C IN 1-2 DAYS, DEPENDING ON CLINICAL CONDITION.  Molinda Bailiff Chia Mowers M.D on 06/28/2018 at 11:11 PM  Between 7am to 6pm - Pager - [phone]  After 6pm go to [email or web address] - password [email or web address]  [facility] [place] [facility]  Office  [phone]  CC: Primary care physician; Patient, No Pcp Per  Note: This dictation was prepared with Dragon dictation along with smaller phrase technology. Any transcriptional errors that result from this process are unintentional.

## 2018-06-28 NOTE — Consult Note (Addendum)
ORTHOPAEDIC CONSULTATION  REQUESTING PHYSICIAN: Milagros Loll, MD  Chief Complaint: Mass left foot  HPI: Sara Hill is a 27 y.o. female who complains of mass on plantar left heel.  Present for 4 years.  Progressively increasing in size.  Painful.  No recent drainage but has bled in past.  Noted smaller lesions on right heel recently.  Past Medical History:  Diagnosis Date  . Gall stones   . Low blood potassium    Past Surgical History:  Procedure Laterality Date  . CHOLECYSTECTOMY    . COLONOSCOPY  01/2018   normal  . ESOPHAGOGASTRODUODENOSCOPY  01/2018   normal   Social History   Socioeconomic History  . Marital status: Single    Spouse name: Not on file  . Number of children: Not on file  . Years of education: Not on file  . Highest education level: Not on file  Occupational History  . Not on file  Social Needs  . Financial resource strain: Not on file  . Food insecurity:    Worry: Not on file    Inability: Not on file  . Transportation needs:    Medical: Not on file    Non-medical: Not on file  Tobacco Use  . Smoking status: Never Smoker  . Smokeless tobacco: Never Used  Substance and Sexual Activity  . Alcohol use: No  . Drug use: Yes    Types: Marijuana  . Sexual activity: Never  Lifestyle  . Physical activity:    Days per week: Not on file    Minutes per session: Not on file  . Stress: Not on file  Relationships  . Social connections:    Talks on phone: Not on file    Gets together: Not on file    Attends religious service: Not on file    Active member of club or organization: Not on file    Attends meetings of clubs or organizations: Not on file    Relationship status: Not on file  Other Topics Concern  . Not on file  Social History Narrative   Lives at home with a friend.  Independent at baseline   Family History  Problem Relation Age of Onset  . Healthy Mother   . Diabetes Mother    Allergies  Allergen Reactions  .  Benadryl [Diphenhydramine Hcl] Swelling    Pt reeports "feels like throat swelling".    Prior to Admission medications   Medication Sig Start Date End Date Taking? Authorizing Provider  dextromethorphan-guaiFENesin (MUCINEX DM) 30-600 MG 12hr tablet Take 1 tablet by mouth 2 (two) times daily. 10/13/17   Georgetta Haber, NP  potassium chloride (K-DUR) 10 MEQ tablet Take 1 tablet (10 mEq total) by mouth 2 (two) times daily for 7 days. 09/09/17 09/16/17  Michela Pitcher A, PA-C  potassium chloride SA (K-DUR,KLOR-CON) 20 MEQ tablet Take 1 po BID x 7 days then once a day Patient not taking: Reported on 09/09/2017 07/27/15   Devoria Albe, MD   Ct Abdomen Pelvis W Contrast  Result Date: 06/27/2018 CLINICAL DATA:  Abdominal pain EXAM: CT ABDOMEN AND PELVIS WITH CONTRAST TECHNIQUE: Multidetector CT imaging of the abdomen and pelvis was performed using the standard protocol following bolus administration of intravenous contrast. CONTRAST:  50mL OMNIPAQUE IOHEXOL 300 MG/ML  SOLN COMPARISON:  CT 07/27/2015 FINDINGS: Lower chest: Lung bases demonstrate no acute consolidation or effusion. The heart size is normal. Hepatobiliary: Status post cholecystectomy. Mild intra and extrahepatic biliary enlargement. Common bile duct diameter measures up  to 9 mm. Slightly heterogeneous enhancement of the right hepatic lobe but without focal abnormality. Pancreas: Unremarkable. No pancreatic ductal dilatation or surrounding inflammatory changes. Spleen: Normal in size without focal abnormality. Adrenals/Urinary Tract: Adrenal glands are normal. No hydronephrosis. Numerous intrarenal calculi bilaterally. Bladder within normal limits. Stomach/Bowel: Stomach is within normal limits. Appendix appears normal. No evidence of bowel wall thickening, distention, or inflammatory changes. Vascular/Lymphatic: No significant vascular findings are present. No enlarged abdominal or pelvic lymph nodes. Reproductive: Endometrial thickening with prominent  endometrial enhancement. No adnexal mass. Other: Negative for free air or free fluid. Musculoskeletal: No acute or significant osseous findings. IMPRESSION: 1. No CT evidence for acute intra-abdominal or pelvic abnormality. 2. Status post cholecystectomy. Interval increase in intra and extrahepatic biliary enlargement likely due to postsurgical change however suggest correlation with LFTs. 3. Numerous intrarenal calculi bilaterally without hydronephrosis Electronically Signed   By: Jasmine PangKim  Fujinaga M.D.   On: 06/27/2018 20:06   Dg Foot Complete Left  Result Date: 06/27/2018 CLINICAL DATA:  Painful mass in the plantar aspect of the left heel with that has been present for the past 4 years. EXAM: LEFT FOOT - COMPLETE 3+ VIEW COMPARISON:  None. FINDINGS: Moderately large superficial mass in the plantar aspect of the right foot, centered in the skin. The bones have normal appearances. IMPRESSION: Moderately large superficial mass in the plantar aspect of the right foot, centered in the skin, without underlying bony abnormality. Electronically Signed   By: Beckie SaltsSteven  Reid M.D.   On: 06/27/2018 17:08    Positive ROS: All other systems have been reviewed and were otherwise negative with the exception of those mentioned in the HPI and as above.  12 point ROS was performed.  Physical Exam: General: Alert and oriented.  No apparent distress.  Vascular:  Left foot:Dorsalis Pedis:  present Posterior Tibial:  present  Right foot: Dorsalis Pedis:  present Posterior Tibial:  present  Neuro:intact  Derm:Right heel with 2 small superficial raised 1.5 cm soft tissue lesions.  Brown discoloration.  Not painful.    Also noted b/l foot calluses separate from lesions of concern  On left heel is a large ~6cm raised darkend firm nodular mass, adhered to skin.  Mildly painful with palpation.  Not cystic in nature.  Ortho/MS: Firm left heel mass as above.  Smaller masses on right.   Assessment: Suspicious soft tissue  mass left heel  Plan: Not sure what this is and recommend further w/u with MRI to start and possibly biopsy. Somewhat concerning with size and adherence to overlying skin.        Irean HongFowler, Keiron Iodice A, DPM Cell [phone]   06/28/2018 12:10 PM

## 2018-06-28 NOTE — Progress Notes (Signed)
Initial Nutrition Assessment  DOCUMENTATION CODES:   Severe malnutrition in context of chronic illness, Underweight  INTERVENTION:  Provide Ensure Enlive po TID, each supplement provides 350 kcal and 20 grams of protein. Patient prefers strawberry.  Provide daily MVI.  Reviewed "High Fiber Nutrition Therapy" handout from the Academy of Nutrition and Dietetics with patient. Discussed the different types of fiber (soluble vs insoluble). Encouraged increasing intake of soluble fiber to help lessen diarrhea. Discussed examples of soluble fiber.  Monitor magnesium, potassium, and phosphorus daily for at least 3 days, MD to replete as needed, as pt is at risk for refeeding syndrome given severe malnutrition, suspected malabsorption.  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  GOAL:   Patient will meet greater than or equal to 90% of their needs  MONITOR:   PO intake, Supplement acceptance, Labs, Weight trends, I & O's, Other (Comment)(bowel movements, GI symptoms)  REASON FOR ASSESSMENT:   Malnutrition Screening Tool, Consult Assessment of nutrition requirement/status  ASSESSMENT:   27 year old female with PMHx of gall stones s/p cholecystectomy, chronic diarrhea and hypokalemia since cholecystectomy who presents with worsening weakness and abdominal pain found to have severe hypokalemia.   Met with patient and her friend at bedside. Patient reports she had her gallbladder surgery in 2013. Since then she has been unable to tolerate oral intake and has experienced anorexia (absence of hunger) and early satiety. She also reports body aches, weakness, nausea, vomiting, bloating, and frequent watery diarrhea (usually 10 times per day). She is only able to eat very small amounts at meals. She may have one hot dog or a small bite of meat and some  mashed potatoes. She also drinks smoothies but they do not contain any protein. Patient was unable to eat any of the lunch on her tray but she was amenable to drinking an Ensure. Discussed that Creon is to be taken with all meals and snacks to work properly.  UBW was 98 lbs (44.5 kg) prior to cholecystectomy. She reports she did weigh as high as 110 lbs when she was 19 or 27 years old. Per chart patient was 44.3 kg on 06/27/2013, 43.1 kg on 11/26/2014, 41.7 kg on 01/12/2016, 39.5 kg on 03/04/2017, and is currently 35.9 kg (79.15 lbs).   Medications reviewed and include: dicyclomine 10 mg TID, Creon 24000 units TID with meals, MVI daily, potassium chloride 40 mEq Q4hrs PO, ursosdiol 300 mg BID, NS with KCl 40 mEq/L at 25 mL/hr.  Labs reviewed: Potassium 3, BUN <5, Creatinine 0.33.  Very concerned that patient is experiencing malabsorption related to her diarrhea/post-cholecystectomy syndrome. Plan is to start patient on Creon. Recommend [place] 63[address] lipase units/meal and 12000 lipase units/snack/ONS. Dose may need to be increased based on symptoms.  NUTRITION - FOCUSED PHYSICAL EXAM:    Most Recent Value  Orbital Region  Severe depletion  Upper Arm Region  Severe depletion  Thoracic and Lumbar Region  Severe depletion  Buccal Region  Severe depletion  Temple Region  Severe depletion  Clavicle Bone Region  Severe depletion  Clavicle and Acromion Bone Region  Severe depletion  Scapular Bone Region  Severe depletion  Dorsal Hand  Severe depletion  Patellar Region  Severe depletion  Anterior Thigh Region  Severe depletion  Posterior Calf Region  Severe depletion  Edema (RD Assessment)  None  Hair  Reviewed  Eyes  Reviewed  Mouth  Reviewed  Skin  Reviewed  Nails  Reviewed     Diet Order:   Diet Order            Diet regular Room service appropriate? Yes; Fluid consistency: Thin  Diet effective now             EDUCATION NEEDS:   Education needs have been addressed  Skin:   Skin Assessment: Reviewed RN Assessment  Last BM:  06/27/2018  Height:   Ht Readings from Last 1 Encounters:  06/27/18 5' (1.524 m)   Weight:   Wt Readings from Last 1 Encounters:  06/28/18 35.9 kg   Ideal Body Weight:  45.5 kg  BMI:  Body mass index is 15.46 kg/m.  Estimated Nutritional Needs:   Kcal:  1435-1615 (40-45 kcal/kg for weight gain)  Protein:  65-72 grams (1.8-2 grams/kg)  Fluid:  1.4-1.6 L/day  [name], MS, RD, LDN Office: [phone] Pager: [phone] After Hours/Weekend Pager: [phone]

## 2018-06-28 NOTE — Progress Notes (Signed)
Order received from Dr Sudini to renew telemetry 

## 2018-06-28 NOTE — Progress Notes (Signed)
Patient says she doesn't feel well and that she feels very weak. Order received from Dr Elpidio AnisSudini for NS 500ml bolus

## 2018-06-29 DIAGNOSIS — E43 Unspecified severe protein-calorie malnutrition: Secondary | ICD-10-CM

## 2018-06-29 LAB — HIV ANTIBODY (ROUTINE TESTING W REFLEX)
HIV Screen 4th Generation wRfx: NONREACTIVE
HIV Screen 4th Generation wRfx: NONREACTIVE

## 2018-06-29 LAB — BASIC METABOLIC PANEL
Anion gap: 4 — ABNORMAL LOW (ref 5–15)
BUN: <5 mg/dL — ABNORMAL LOW (ref 6–20)
CHLORIDE: 110 mmol/L (ref 98–111)
CO2: 25 mmol/L (ref 22–32)
Calcium: 8.7 mg/dL — ABNORMAL LOW (ref 8.9–10.3)
Glucose, Bld: 93 mg/dL (ref 70–99)
POTASSIUM: 3.2 mmol/L — AB (ref 3.5–5.1)
SODIUM: 139 mmol/L (ref 135–145)

## 2018-06-29 LAB — PHOSPHORUS: Phosphorus: 3.4 mg/dL (ref 2.5–4.6)

## 2018-06-29 MED ORDER — POTASSIUM CHLORIDE CRYS ER 20 MEQ PO TBCR: 40.0000 meq | EXTENDED_RELEASE_TABLET | Freq: Two times a day (BID) | ORAL | 0 refills | Status: DC

## 2018-06-29 MED ORDER — POTASSIUM CHLORIDE CRYS ER 20 MEQ PO TBCR
40.0000 meq | EXTENDED_RELEASE_TABLET | ORAL | Status: AC
  Administered 2018-06-29 (×2): 40 meq via ORAL
  Filled 2018-06-29 (×2): qty 2

## 2018-06-29 MED ORDER — MORPHINE SULFATE (PF) 2 MG/ML IV SOLN: 2.0000 mg | INTRAVENOUS | Status: DC | PRN

## 2018-06-29 NOTE — Care Management Note (Signed)
Case Management Note  Patient Details  Name: Sara Hill MRN: [id number] Date of Birth: 03-Feb-1991   Patient to discharge today.  Patient lives at home with significant other who is at bedside.  Patient is uninsured, and does not have PCP.  [place].  Patient [facility].  Patient will not have to pay anything up front to go to appointment.  Patient will be billed, then she can apply for their financial assistance program.    Patient to discharge with prescriptions for Bentyl, creon, ursodiol and Kdur. All but creon can be picked up at no cost to the patient at [facility]  After discharge.  prescriptions were faxed to [facility] .  Patient and significant other notified where to pick up.  Patient provided with application to [facility] , however since [place], and does not have an MD in [place], they will not be able to provided the patient with ongoing support.    Creon is $578 with goodrx coupon.  Patient is going to purse financial aid for medications through [facility].  In the mean time, their plan is to pay out of pocket for the prescription if able.  MD notified.   Subjective/Objective:                    Action/Plan:   Expected Discharge Date:  06/29/18               Expected Discharge Plan:  Home/Self Care  In-House Referral:     Discharge planning Services  Medication Assistance, Indigent Health Clinic  Post Acute Care Choice:    Choice offered to:     DME Arranged:    DME Agency:     HH Arranged:    HH Agency:     Status of Service:  Completed, signed off  If discussed at [employer] Meetings, dates discussed:    Additional Comments:  Chapman FitchBOWEN, [name], RN 06/29/2018, 3:13 PM

## 2018-06-29 NOTE — Progress Notes (Signed)
     Sara Hill was admitted to the [facility] on 06/27/2018 for an acute medical condition and is being Discharged on  06/29/2018 .  She will need another 3  days for recovery and so advised to stay away from work until then. So please excuse  her from work for the above  Days. Should be able to return to work  without any restrictions from 07/02/18.  Call Enid Baasadhika Nikaya Nasby  MD, [facility] at  [phone] with questions.  Enid BaasKALISETTI,Nicandro Perrault M.D on 06/29/2018,at 10:38 AM  [facility] [address], [place] [id number]

## 2018-06-29 NOTE — Progress Notes (Signed)
Subjective/Chief Complaint: Patient seen.  Doing okay as far as her foot.   Objective: Vital signs in last 24 hours: Temp:  [97.8 F (36.6 C)-98.2 F (36.8 C)] 98.2 F (36.8 C) (11/18 0548) Pulse Rate:  [56-68] 61 (11/18 0548) Resp:  [16-18] 16 (11/18 0548) BP: (90-102)/(60-68) 102/63 (11/18 0548) SpO2:  [100 %] 100 % (11/18 0548) Weight:  [34.5 kg-35.9 kg] 34.5 kg (11/17 1729) Last BM Date: 06/27/18  Intake/Output from previous day: 11/17 0701 - 11/18 0700 In: 652.5 [P.O.:200; I.V.:366.5; IV Piggyback:86] Out: -  Intake/Output this shift: Total I/O In: 240 [P.O.:240] Out: -   Tumor on the left foot is stable with no drainage.  Some discomfort on palpation.  MRI returned with tumor left heel with heterogeneous component with some peripheral uptake.  Melanoma versus fibrous tumor in the differential.  Lab Results:  Recent Labs    06/27/18 1529 06/28/18 0519  WBC 6.2 4.0  HGB 13.8 12.8  HCT 38.7 36.8  PLT 255 239   BMET Recent Labs    06/28/18 0519 06/29/18 0518  NA 142 139  K 3.0* 3.2*  CL 110 110  CO2 26 25  GLUCOSE 104* 93  BUN <5* <5*  CREATININE 0.33* <0.30*  CALCIUM 9.0 8.7*   PT/INR No results for input(s): LABPROT, INR in the last 72 hours. ABG No results for input(s): PHART, HCO3 in the last 72 hours.  Invalid input(s): PCO2, PO2  Studies/Results: Ct Abdomen Pelvis W Contrast  Result Date: 06/27/2018 CLINICAL DATA:  Abdominal pain EXAM: CT ABDOMEN AND PELVIS WITH CONTRAST TECHNIQUE: Multidetector CT imaging of the abdomen and pelvis was performed using the standard protocol following bolus administration of intravenous contrast. CONTRAST:  50mL OMNIPAQUE IOHEXOL 300 MG/ML  SOLN COMPARISON:  CT 07/27/2015 FINDINGS: Lower chest: Lung bases demonstrate no acute consolidation or effusion. The heart size is normal. Hepatobiliary: Status post cholecystectomy. Mild intra and extrahepatic biliary enlargement. Common bile duct diameter measures up to  9 mm. Slightly heterogeneous enhancement of the right hepatic lobe but without focal abnormality. Pancreas: Unremarkable. No pancreatic ductal dilatation or surrounding inflammatory changes. Spleen: Normal in size without focal abnormality. Adrenals/Urinary Tract: Adrenal glands are normal. No hydronephrosis. Numerous intrarenal calculi bilaterally. Bladder within normal limits. Stomach/Bowel: Stomach is within normal limits. Appendix appears normal. No evidence of bowel wall thickening, distention, or inflammatory changes. Vascular/Lymphatic: No significant vascular findings are present. No enlarged abdominal or pelvic lymph nodes. Reproductive: Endometrial thickening with prominent endometrial enhancement. No adnexal mass. Other: Negative for free air or free fluid. Musculoskeletal: No acute or significant osseous findings. IMPRESSION: 1. No CT evidence for acute intra-abdominal or pelvic abnormality. 2. Status post cholecystectomy. Interval increase in intra and extrahepatic biliary enlargement likely due to postsurgical change however suggest correlation with LFTs. 3. Numerous intrarenal calculi bilaterally without hydronephrosis Electronically Signed   By: Jasmine Pang M.D.   On: 06/27/2018 20:06   Mr Ankle Left W Wo Contrast  Result Date: 06/28/2018 CLINICAL DATA:  Chronic plantar heel mass. EXAM: MRI OF THE LEFT ANKLE WITHOUT AND WITH CONTRAST TECHNIQUE: Multiplanar, multisequence MR imaging of the ankle was performed before and after the administration of intravenous contrast. CONTRAST:  3 mL Gadavist intravenous contrast. COMPARISON:  Left foot x-rays from yesterday. FINDINGS: TENDONS Peroneal: Peroneal longus tendon intact. Peroneal brevis intact. Posteromedial: Posterior tibial tendon intact. Flexor hallucis longus tendon intact. Flexor digitorum longus tendon intact. Anterior: Tibialis anterior tendon intact. Extensor hallucis longus tendon intact Extensor digitorum longus tendon intact. Achilles:  Intact. Plantar Fascia: Intact. LIGAMENTS Lateral: Anterior talofibular ligament intact. Calcaneofibular ligament intact. Posterior talofibular ligament intact. Anterior and posterior tibiofibular ligaments intact. Medial: Deltoid ligament intact. Spring ligament intact. CARTILAGE Ankle Joint: No joint effusion. Normal ankle mortise. No chondral defect. Subtalar Joints/Sinus Tarsi: Normal subtalar joints. No subtalar joint effusion. Normal sinus tarsi. Bones: No marrow signal abnormality.  No fracture or dislocation. Soft Tissue: Along the plantar surface of the hindfoot, there is a large superficial skin mass measuring 4.9 x 4.9 x 1.3 cm (AP by transverse by CC). The mass is predominantly hypointense on T2 images, with peripheral nodular T2 hyperintensity, and heterogeneously T1 signal with areas of T1 hyperintensity. There is peripheral nodular enhancement. There is no extension into the subcutaneous fat. IMPRESSION: IMPRESSION 1. Large, peripherally enhancing 4.9 cm dermal mass of the plantar hindfoot, indeterminate. No extension into the subcutaneous fat. Given areas of internal T1 hyperintensity, melanoma is a possibility. The differential diagnosis also includes a fibrous tumor. Recommend biopsy and/or surgical excision. Electronically Signed   By: Obie DredgeWilliam T Derry M.D.   On: 06/28/2018 18:56   Dg Foot Complete Left  Result Date: 06/27/2018 CLINICAL DATA:  Painful mass in the plantar aspect of the left heel with that has been present for the past 4 years. EXAM: LEFT FOOT - COMPLETE 3+ VIEW COMPARISON:  None. FINDINGS: Moderately large superficial mass in the plantar aspect of the right foot, centered in the skin. The bones have normal appearances. IMPRESSION: Moderately large superficial mass in the plantar aspect of the right foot, centered in the skin, without underlying bony abnormality. Electronically Signed   By: Beckie SaltsSteven  Reid M.D.   On: 06/27/2018 17:08    Anti-infectives: Anti-infectives (From  admission, onward)   None      Assessment/Plan: s/p * No surgery found * Assessment: Long-term tumor left heel   Plan: Discussed with the patient and Dr. Nemiah CommanderKalisetti that due to the undetermined characteristics of the tumor would recommend evaluation by oncology clinic at a larger institution.  [facility]  Discussed that this will need to be biopsied and treatment determined by results of biopsy.  Discussed with the patient the importance of making sure that she keeps this appointment as this needs to be evaluated.  Patient should be stable for discharge.  LOS: 2 days    Ricci Barkerodd W Carlise Stofer 06/29/2018

## 2018-06-29 NOTE — Progress Notes (Signed)
06/29/2018 3:12 PM  Sara Hill to be D/C'd Home per MD order.  Discussed prescriptions and follow up appointments with the patient. Prescriptions given to patient, medication list explained in detail. Pt verbalized understanding.  Allergies as of 06/29/2018      Reactions   Benadryl [diphenhydramine Hcl] Swelling   Pt reeports "feels like throat swelling".      Medication List    STOP taking these medications   potassium chloride 10 MEQ tablet Commonly known as:  K-DUR     TAKE these medications   dextromethorphan-guaiFENesin 30-600 MG 12hr tablet Commonly known as:  MUCINEX DM Take 1 tablet by mouth 2 (two) times daily.   dicyclomine 10 MG capsule Commonly known as:  BENTYL Take 1 capsule (10 mg total) by mouth 3 (three) times daily before meals.   Pancrelipase (Lip-Prot-Amyl) 24000-76000 units Cpep Take 1 capsule (24,000 Units total) by mouth 3 (three) times daily with meals. Notes to patient:  Before next evening meal   potassium chloride SA 20 MEQ tablet Commonly known as:  K-DUR,KLOR-CON Take 2 tablets (40 mEq total) by mouth 2 (two) times daily. Take two tablets twice a day for 1 week and then change to once a day What changed:    how much to take  how to take this  when to take this  additional instructions   ursodiol 300 MG capsule Commonly known as:  ACTIGALL Take 1 capsule (300 mg total) by mouth daily.       Vitals:   06/28/18 2044 06/29/18 0548  BP: 100/68 102/63  Pulse: 68 61  Resp: 18 16  Temp: 97.8 F (36.6 C) 98.2 F (36.8 C)  SpO2: 100% 100%    Skin clean, dry and intact without evidence of skin break down, no evidence of skin tears noted. IV catheter discontinued intact. Site without signs and symptoms of complications. Dressing and pressure applied. Pt denies pain at this time. No complaints noted.  An After Visit Summary was printed and given to the patient. Patient escorted via WC, and D/C home via private auto.  Madie RenoMisty  D Zohra Clavel, RN

## 2018-06-29 NOTE — Discharge Summary (Signed)
[facility] [place] [facility]   PATIENT NAME: Sara Hill    MR#:  [id number]  DATE OF BIRTH:  05/25/1991  DATE OF ADMISSION:  06/27/2018   ADMITTING PHYSICIAN: Enid Baas, MD  DATE OF DISCHARGE:  06/29/18  PRIMARY CARE PHYSICIAN: Bing Neighbors, FNP   ADMISSION DIAGNOSIS:   Hypokalemia [E87.6] Chronic diarrhea [K52.9]  DISCHARGE DIAGNOSIS:   Active Problems:   Hypokalemia   Protein-calorie malnutrition, severe   SECONDARY DIAGNOSIS:   Past Medical History:  Diagnosis Date  . Gall stones   . Low blood potassium     HOSPITAL COURSE:   [name]  is a 27 y.o. 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  1.  Severe hypokalemia-secondary to GI losses from chronic diarrhea -Noncompliant with oral supplements at home.   -Electrolytes have been replenished.  Patient is being discharged on oral potassium supplements.  Also her diarrhea is improved now  2.  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    And Creon here in the hospital with significant improvement. -Continue as outpatient as well. -EGD and colonoscopy from outside hospital normal from June 2019. -Added Bentyl for spasmodic abdominal pain with significant improvement. -Stool studies are are negative, HIV is negative -CT of the abdomen and pelvis showing no acute intra-abdominal abnormality.  Numerous intrarenal bilateral calculi without hydronephrosis noted.  3.  Left foot wart-present for 4 years, slowly growing and is more painful now. -Appreciate podiatry consult.  MRI of the foot ordered and that shows possible melanoma versus fibrous tumor in the differential due to heterogeneous competent with some peripheral intake. -Recommended to tertiary care center  follow-up for possible biopsy or removal and plastic surgery to close the wound. -Patient wants to follow-up with [facility] oncology.   4.  Severe malnutrition-appreciate dietary consult.  Improved diet now.  Continue supplemental drinks  DISCHARGE CONDITIONS:   Guarded  CONSULTS OBTAINED:   Treatment Team:  Gwyneth Revels, DPM  DRUG ALLERGIES:   Allergies  Allergen Reactions  . Benadryl [Diphenhydramine Hcl] Swelling    Pt reeports "feels like throat swelling".    DISCHARGE MEDICATIONS:   Allergies as of 06/29/2018      Reactions   Benadryl [diphenhydramine Hcl] Swelling   Pt reeports "feels like throat swelling".      Medication List    STOP taking these medications   potassium chloride 10 MEQ tablet Commonly known as:  K-DUR     TAKE these medications   dextromethorphan-guaiFENesin 30-600 MG 12hr tablet Commonly known as:  MUCINEX DM Take 1 tablet by mouth 2 (two) times daily.   dicyclomine 10 MG capsule Commonly known as:  BENTYL Take 1 capsule (10 mg total) by mouth 3 (three) times daily before meals.   Pancrelipase (Lip-Prot-Amyl) 24000-76000 units Cpep Take 1 capsule (24,000 Units total) by mouth 3 (three) times daily with meals. Notes to patient:  Before next evening meal   potassium chloride SA 20 MEQ tablet Commonly known as:  K-DUR,KLOR-CON Take 2 tablets (40 mEq total) by mouth 2 (two) times daily. Take two tablets twice a day for 1 week and then change to once a day What changed:    how much to take  how to take this  when to take this  additional instructions   ursodiol 300 MG capsule Commonly known as:  ACTIGALL Take 1  capsule (300 mg total) by mouth daily.        DISCHARGE INSTRUCTIONS:   1.  PCP follow-up in 1 week 2.  [facility] oncology follow-up for left foot mass in 1 week 3.  GI follow-up in 1 to 2 weeks  DIET:   Regular diet  ACTIVITY:   Activity as tolerated  OXYGEN:   Home Oxygen: No.  Oxygen Delivery: room  air  DISCHARGE LOCATION:   home   If you experience worsening of your admission symptoms, develop shortness of breath, life threatening emergency, suicidal or homicidal thoughts you must seek medical attention immediately by calling 911 or calling your MD immediately  if symptoms less severe.  You Must read complete instructions/literature along with all the possible adverse reactions/side effects for all the Medicines you take and that have been prescribed to you. Take any new Medicines after you have completely understood and accpet all the possible adverse reactions/side effects.   Please note  You were cared for by a hospitalist during your hospital stay. If you have any questions about your discharge medications or the care you received while you were in the hospital after you are discharged, you can call the unit and asked to speak with the hospitalist on call if the hospitalist that took care of you is not available. Once you are discharged, your primary care physician will handle any further medical issues. Please note that NO REFILLS for any discharge medications will be authorized once you are discharged, as it is imperative that you return to your primary care physician (or establish a relationship with a primary care physician if you do not have one) for your aftercare needs so that they can reassess your need for medications and monitor your lab values.    On the day of Discharge:  VITAL SIGNS:   Blood pressure 102/63, pulse 61, temperature 98.2 F (36.8 C), temperature source Oral, resp. rate 16, height 5' (1.524 m), weight 35.9 kg, last menstrual period 06/13/2018, SpO2 100 %.  PHYSICAL EXAMINATION:   GENERAL:  27 y.o.-year-old severely malnourished patient lying in the bed with no acute distress.  EYES: Pupils equal, round, reactive to light and accommodation. No scleral icterus. Extraocular muscles intact.  HEENT: Head atraumatic, normocephalic. Oropharynx and nasopharynx  clear.  NECK:  Supple, no jugular venous distention. No thyroid enlargement, no tenderness.  LUNGS: Normal breath sounds bilaterally, no wheezing, rales,rhonchi or crepitation. No use of accessory muscles of respiration.  CARDIOVASCULAR: S1, S2 normal. No murmurs, rubs, or gallops.  ABDOMEN: Soft, nontender, nondistended. Bowel sounds present. No organomegaly or mass.  EXTREMITIES: No pedal edema, cyanosis, or clubbing.  Distal to the heel of left foot on the sole, there is a thick 4 x 6 cm forearm wart which is tender to touch. NEUROLOGIC: Cranial nerves II through XII are intact. Muscle strength 5/5 in all extremities. Sensation intact. Gait not checked.  PSYCHIATRIC: The patient is alert and oriented x 3.  SKIN: No obvious rash, lesion, or ulcer.   DATA REVIEW:   CBC Recent Labs  Lab 06/28/18 0519  WBC 4.0  HGB 12.8  HCT 36.8  PLT 239    Chemistries  Recent Labs  Lab 06/27/18 1529  06/29/18 0518  NA 140   < > 139  K 2.1*   < > 3.2*  CL 106   < > 110  CO2 24   < > 25  GLUCOSE 103*   < > 93  BUN 5*   < > <  5*  CREATININE 0.35*   < > <0.30*  CALCIUM 9.3   < > 8.7*  MG 1.8  --   --   AST 26  --   --   ALT 25  --   --   ALKPHOS 71  --   --   BILITOT 1.8*  --   --    < > = values in this interval not displayed.     Microbiology Results  Results for orders placed or performed during the hospital encounter of 06/27/18  Gastrointestinal Panel by PCR , Stool     Status: None   Collection Time: 06/28/18  5:07 AM  Result Value Ref Range Status   Campylobacter species NOT DETECTED NOT DETECTED Final   Plesimonas shigelloides NOT DETECTED NOT DETECTED Final   Salmonella species NOT DETECTED NOT DETECTED Final   Yersinia enterocolitica NOT DETECTED NOT DETECTED Final   Vibrio species NOT DETECTED NOT DETECTED Final   Vibrio cholerae NOT DETECTED NOT DETECTED Final   Enteroaggregative E coli (EAEC) NOT DETECTED NOT DETECTED Final   Enteropathogenic E coli (EPEC) NOT  DETECTED NOT DETECTED Final   Enterotoxigenic E coli (ETEC) NOT DETECTED NOT DETECTED Final   Shiga like toxin producing E coli (STEC) NOT DETECTED NOT DETECTED Final   Shigella/Enteroinvasive E coli (EIEC) NOT DETECTED NOT DETECTED Final   Cryptosporidium NOT DETECTED NOT DETECTED Final   Cyclospora cayetanensis NOT DETECTED NOT DETECTED Final   Entamoeba histolytica NOT DETECTED NOT DETECTED Final   Giardia lamblia NOT DETECTED NOT DETECTED Final   Adenovirus F40/41 NOT DETECTED NOT DETECTED Final   Astrovirus NOT DETECTED NOT DETECTED Final   Norovirus GI/GII NOT DETECTED NOT DETECTED Final   Rotavirus A NOT DETECTED NOT DETECTED Final   Sapovirus (I, II, IV, and V) NOT DETECTED NOT DETECTED Final    Comment: Performed at [facility], [address] [place], [place] [address]  C difficile quick scan w PCR reflex     Status: None   Collection Time: 06/28/18  5:07 AM  Result Value Ref Range Status   C Diff antigen NEGATIVE NEGATIVE Final   C Diff toxin NEGATIVE NEGATIVE Final   C Diff interpretation No C. difficile detected.  Final    Comment: Performed at [facility], [address] [place] [place], [place] [address]    RADIOLOGY:  Mr Ankle Left W Wo Contrast  Result Date: 06/28/2018 CLINICAL DATA:  Chronic plantar heel mass. EXAM: MRI OF THE LEFT ANKLE WITHOUT AND WITH CONTRAST TECHNIQUE: Multiplanar, multisequence MR imaging of the ankle was performed before and after the administration of intravenous contrast. CONTRAST:  3 mL Gadavist intravenous contrast. COMPARISON:  Left foot x-rays from yesterday. FINDINGS: TENDONS Peroneal: Peroneal longus tendon intact. Peroneal brevis intact. Posteromedial: Posterior tibial tendon intact. Flexor hallucis longus tendon intact. Flexor digitorum longus tendon intact. Anterior: Tibialis anterior tendon intact. Extensor hallucis longus tendon intact Extensor digitorum longus tendon intact. Achilles:  Intact. Plantar Fascia: Intact.  LIGAMENTS Lateral: Anterior talofibular ligament intact. Calcaneofibular ligament intact. Posterior talofibular ligament intact. Anterior and posterior tibiofibular ligaments intact. Medial: Deltoid ligament intact. Spring ligament intact. CARTILAGE Ankle Joint: No joint effusion. Normal ankle mortise. No chondral defect. Subtalar Joints/Sinus Tarsi: Normal subtalar joints. No subtalar joint effusion. Normal sinus tarsi. Bones: No marrow signal abnormality.  No fracture or dislocation. Soft Tissue: Along the plantar surface of the hindfoot, there is a large superficial skin mass measuring 4.9 x 4.9 x 1.3 cm (AP by transverse  by CC). The mass is predominantly hypointense on T2 images, with peripheral nodular T2 hyperintensity, and heterogeneously T1 signal with areas of T1 hyperintensity. There is peripheral nodular enhancement. There is no extension into the subcutaneous fat. IMPRESSION: IMPRESSION 1. Large, peripherally enhancing 4.9 cm dermal mass of the plantar hindfoot, indeterminate. No extension into the subcutaneous fat. Given areas of internal T1 hyperintensity, melanoma is a possibility. The differential diagnosis also includes a fibrous tumor. Recommend biopsy and/or surgical excision. Electronically Signed   By: Obie Dredge M.D.   On: 06/28/2018 18:56     Management plans discussed with the patient, family and they are in agreement.  CODE STATUS:     Code Status Orders  (From admission, onward)         Start     Ordered   06/27/18 2049  Full code  Continuous     06/27/18 2048        Code Status History    This patient has a current code status but no historical code status.      TOTAL TIME TAKING CARE OF THIS PATIENT: 38 minutes.    Enid Baas M.D on 06/29/2018 at 2:46 PM  Between 7am to 6pm - Pager - [phone]  After 6pm go to [email or web address] - Social research officer, government  [facility]  Office  [phone]  CC: Primary care physician;  Bing Neighbors, FNP   Note: This dictation was prepared with Dragon dictation along with smaller phrase technology. Any transcriptional errors that result from this process are unintentional.

## 2018-07-02 ENCOUNTER — Ambulatory Visit: Payer: Self-pay | Admitting: Family Medicine

## 2018-07-02 ENCOUNTER — Encounter: Payer: Self-pay | Admitting: Family Medicine

## 2018-07-02 ENCOUNTER — Ambulatory Visit (INDEPENDENT_AMBULATORY_CARE_PROVIDER_SITE_OTHER): Payer: Self-pay | Admitting: Family Medicine

## 2018-07-02 VITALS — BP 96/62 | HR 85 | Resp 17 | Ht 63.0 in | Wt 86.4 lb

## 2018-07-02 DIAGNOSIS — E876 Hypokalemia: Secondary | ICD-10-CM

## 2018-07-02 DIAGNOSIS — E43 Unspecified severe protein-calorie malnutrition: Secondary | ICD-10-CM

## 2018-07-02 DIAGNOSIS — K529 Noninfective gastroenteritis and colitis, unspecified: Secondary | ICD-10-CM

## 2018-07-02 DIAGNOSIS — K909 Intestinal malabsorption, unspecified: Secondary | ICD-10-CM

## 2018-07-02 MED ORDER — PANCRELIPASE (LIP-PROT-AMYL) 24000-76000 UNITS PO CPEP: 24000.0000 [IU] | ORAL_CAPSULE | Freq: Three times a day (TID) | ORAL | 1 refills | Status: DC

## 2018-07-02 NOTE — Progress Notes (Signed)
Sara Hill, is a 27 y.o. female  NFA:[id number]  ION:[id number]  DOB - April 02, 1991  CC:  Chief Complaint  Patient presents with  . Establish Care  . Hospitalization Follow-up    ED->Hosp 11/16-11/18: chronic diarrhea, severe hypokalemia       HPI: Sara Hill is a 27 y.o. female is here today to establish care.   Sara Hill has Hypokalemia and Protein-calorie malnutrition, severe on their problem list.   Today's visit:  Sara Hill presents to establish care. Patient was recently hospitalized for hypokalemia, protein calorie malnutrition, and chronic diarrhea.  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  She has been without insurance and therefore has mostly been managed in ERs.  Underwent an EGD and colonoscopy from a different health system in June 2019 which was normal CT of the abdomen was negative of any acute findings. An incidental finding on CT was that patient suffers from multiple intrarenal bilateral calculi and was negative of hydronephrosis. While at the hospital she was started on Creon and judgment of malabsorption which significantly improved her ability to hold food and decreased episodes of diarrhea.  Her potassium was corrected and she is chronically taking low-dose potassium replacement orally.  Left foot mass Patient was also found to have a mass of the left heel foot.  This has been progressively growing over the course of 4 years.  It is more painful now and is not impairing ability to stand for prolonged periods of time.  MRI was performed during recent hospitalization and there was some concern for possible melanoma as there was peripheral vascular intake noted imaging.  Patient was referred to [facility] oncology for further work-up and management.  Medication problem  Today her major concern is being able to obtain Creon outpatient basis.  She is  uninsured has minimal financial resources available.  He has location pending for Medicaid however has been denied multiple times given that she is employed and has no dependents. She also needs a follow-up witg Gi .  She will require assistance from [facility] patient pharmacy and additional financial resources which she may qualify for.  Current medications: Current Outpatient Medications:  .  dicyclomine (BENTYL) 10 MG capsule, Take 1 capsule (10 mg total) by mouth 3 (three) times daily before meals., Disp: 30 capsule, Rfl: 0 .  potassium chloride SA (K-DUR,KLOR-CON) 20 MEQ tablet, Take 2 tablets (40 mEq total) by mouth 2 (two) times daily. Take two tablets twice a day for 1 week and then change to once a day, Disp: 50 tablet, Rfl: 0 .  ursodiol (ACTIGALL) 300 MG capsule, Take 1 capsule (300 mg total) by mouth daily., Disp: 30 capsule, Rfl: 0 .  Pancrelipase, Lip-Prot-Amyl, 24000-76000 units CPEP, Take 1 capsule (24,000 Units total) by mouth 3 (three) times daily with meals., Disp: 90 capsule, Rfl: 1   Pertinent family medical history: family history includes Diabetes in her mother; Healthy in her mother.   Allergies  Allergen Reactions  . Benadryl [Diphenhydramine Hcl] Itching and Swelling    Pt reeports "feels like throat swelling".     Social History   Socioeconomic History  . Marital status: Single    Spouse name: Not on file  . Number of children: Not on file  . Years of education: Not on file  . Highest education level: Not on file  Occupational History  . Not on file  Social Needs  .  Financial resource strain: Not on file  . Food insecurity:    Worry: Not on file    Inability: Not on file  . Transportation needs:    Medical: Not on file    Non-medical: Not on file  Tobacco Use  . Smoking status: Never Smoker  . Smokeless tobacco: Never Used  Substance and Sexual Activity  . Alcohol use: No  . Drug use: Yes    Types: Marijuana  . Sexual activity:  Never  Lifestyle  . Physical activity:    Days per week: Not on file    Minutes per session: Not on file  . Stress: Not on file  Relationships  . Social connections:    Talks on phone: Not on file    Gets together: Not on file    Attends religious service: Not on file    Active member of club or organization: Not on file    Attends meetings of clubs or organizations: Not on file    Relationship status: Not on file  . Intimate partner violence:    Fear of current or ex partner: Not on file    Emotionally abused: Not on file    Physically abused: Not on file    Forced sexual activity: Not on file  Other Topics Concern  . Not on file  Social History Narrative   Lives at home with a friend.  Independent at baseline    Review of Systems: Constitutional: Negative for fever, chills, diaphoresis, or activity change. Decreased appetite. Positive fatigue. Respiratory: Negative for cough, choking, chest tightness, shortness of breath, wheezing and stridor.  Cardiovascular: Negative for chest pain, palpitations and leg swelling. Gastrointestinal: Negative for abdominal distention. Musculoskeletal: Negative for back pain, joint swelling, arthralgia and gait problem. Neurological: Negative for dizziness, tremors, seizures, syncope, facial asymmetry, speech difficulty, weakness, light-headedness, numbness and headaches.  Hematological: Negative for adenopathy. Does not bruise/bleed easily. Psychiatric/Behavioral: Negative for hallucinations, behavioral problems, confusion, dysphoric mood, decreased concentration and agitation.    Objective:   Vitals:   07/02/18 1331  BP: 96/62  Pulse: 85  Resp: 17  SpO2: 98%    BP Readings from Last 3 Encounters:  07/02/18 96/62  06/29/18 102/63  10/13/17 99/61    Filed Weights   07/02/18 1331  Weight: 86 lb 6.4 oz (39.2 kg)      Physical Exam: Constitutional: Patient appears well-developed and well-nourished. No distress. HENT:  Normocephalic, atraumatic, External right and left ear normal. Oropharynx is clear and moist.  Eyes: Conjunctivae and EOM are normal. PERRLA, no scleral icterus. Neck: Normal ROM. Neck supple. No JVD. No tracheal deviation. No thyromegaly. CVS: RRR, S1/S2 +, no murmurs, no gallops, no carotid bruit.  Pulmonary: Effort and breath sounds normal, no stridor, rhonchi, wheezes, rales.  Abdominal: Soft. BS +, no distension, tenderness, rebound or guarding.  Musculoskeletal: Normal range of motion. No edema and no tenderness.  Neuro: Alert. Normal muscle tone coordination. Normal gait. BUE and BLE strength 5/5. Bilateral hand grips symmetrical. No cranial nerve deficit. Skin: Skin is warm and dry. No rash noted. Not diaphoretic. No erythema. No pallor. Psychiatric: Normal mood and affect. Behavior, judgment, thought content normal.  Lab Results (prior encounters)  Lab Results  Component Value Date   WBC 4.0 06/28/2018   HGB 12.8 06/28/2018   HCT 36.8 06/28/2018   MCV 82.3 06/28/2018   PLT 239 06/28/2018   Lab Results  Component Value Date   CREATININE <0.30 (L) 06/29/2018   BUN <5 (L) 06/29/2018  NA 139 06/29/2018   K 3.2 (L) 06/29/2018   CL 110 06/29/2018   CO2 25 06/29/2018       Assessment and plan:  1. Chronically low serum potassium Check comprehensive metabolic panel  2. Chronic diarrhea, improving  -continue bentyl as needed - Comprehensive metabolic panel  3. Protein-calorie malnutrition, severe -encouraged small frequent high protein meals and nutritional supplement drinks 1-2 per day. 4. Intestinal malabsorption, unspecified type -Start Creon. Medication [facility] at [facility].  Financial packet provided. Once approved will refer to GI for further work-up.   Meds ordered this encounter  Medications  . DISCONTD: Pancrelipase, Lip-Prot-Amyl, 24000-76000 units CPEP    Sig: Take 1 capsule (24,000 Units total) by mouth 3 (three) times daily with meals.     Dispense:  90 capsule    Refill:  1    Patient will the pick-up after lunch  . Pancrelipase, Lip-Prot-Amyl, 24000-76000 units CPEP    Sig: Take 1 capsule (24,000 Units total) by mouth 3 (three) times daily with meals.    Dispense:  90 capsule    Refill:  1    Patient will the pick-up after lunch tomorrow   Orders Placed This Encounter  Procedures  . Comprehensive metabolic panel    A total of 30 minutes spent, greater than 50 % of this time was spent counseling and coordination of care.    Return in about 6 weeks (around 08/13/2018).   The patient was given clear instructions to go to ER or return to medical center if symptoms don't improve, worsen or new problems develop. The patient verbalized understanding. The patient was advised  to call and obtain lab results if they haven't heard anything from out office within 7-10 business days.  Joaquin CourtsKimberly [place] Searles, FNP [facility] [address] [place] [id number] [phone]: [phone]    This note has been created with Dragon speech recognition software and Paediatric nursesmart phrase technology. Any transcriptional errors are unintentional.

## 2018-07-02 NOTE — Patient Instructions (Signed)
Pick-up medication tomorrow after 1:30 at  [facility] [address] [name], [name], [place] [id number] [phone]     Thank you for choosing [facility] at [facility] for your [facility]!    Sara Hill was seen by Sara CourtsKimberly Staton Markey, FNP today.   Sara Hill'Hill primary care doctor is Sara Hill, Sara Dibuono S, FNP.   For the best care possible,  you should try to see Sara CourtsKimberly Etta Gassett, FNP-C  whenever you come to clinic.   We look forward to seeing you again soon!  If you have any questions about your visit today,  please call us at [phone]  Or feel free to reach your provider via MyChart.     Ok to reduce dicyclomine 10 mg twice daily to improve gas production.

## 2018-07-06 ENCOUNTER — Encounter: Payer: Self-pay | Admitting: Family Medicine

## 2018-07-06 LAB — COMPREHENSIVE METABOLIC PANEL
ALT: 15 IU/L (ref 0–32)
AST: 12 IU/L (ref 0–40)
Albumin/Globulin Ratio: 1.5 (ref 1.2–2.2)
Albumin: 4.3 g/dL (ref 3.5–5.5)
Alkaline Phosphatase: 76 IU/L (ref 39–117)
BUN/Creatinine Ratio: 13 (ref 9–23)
BUN: 6 mg/dL (ref 6–20)
Bilirubin Total: 0.6 mg/dL (ref 0.0–1.2)
CALCIUM: 9.6 mg/dL (ref 8.7–10.2)
CHLORIDE: 99 mmol/L (ref 96–106)
CO2: 25 mmol/L (ref 20–29)
CREATININE: 0.46 mg/dL — AB (ref 0.57–1.00)
GFR calc Af Amer: 157 mL/min/{1.73_m2} (ref >59)
GFR calc non Af Amer: 136 mL/min/{1.73_m2} (ref >59)
GLOBULIN, TOTAL: 2.9 g/dL (ref 1.5–4.5)
Glucose: 74 mg/dL (ref 65–99)
POTASSIUM: 4.3 mmol/L (ref 3.5–5.2)
Sodium: 141 mmol/L (ref 134–144)
Total Protein: 7.2 g/dL (ref 6.0–8.5)

## 2018-07-08 ENCOUNTER — Ambulatory Visit: Payer: Self-pay

## 2018-08-13 ENCOUNTER — Ambulatory Visit: Payer: Self-pay | Admitting: Family Medicine

## 2018-08-19 ENCOUNTER — Telehealth: Payer: Self-pay | Admitting: Pharmacy Technician

## 2018-08-19 NOTE — Telephone Encounter (Signed)
Patient failed to provide proof of income.  No additional medication assistance will be provided by [facility] without the required proof of income documentation.  Patient notified by letter.  Sara Hill J. Jaquavious Mercer Care Manager [facility] 

## 2018-08-29 ENCOUNTER — Encounter (HOSPITAL_COMMUNITY): Payer: Self-pay

## 2018-08-29 ENCOUNTER — Emergency Department (HOSPITAL_COMMUNITY)
Admission: EM | Admit: 2018-08-29 | Discharge: 2018-08-29 | Disposition: A | Discharge status: Home / Self Care | Payer: Self-pay | Attending: Emergency Medicine | Admitting: Emergency Medicine

## 2018-08-29 ENCOUNTER — Emergency Department (HOSPITAL_COMMUNITY): Payer: Self-pay

## 2018-08-29 DIAGNOSIS — E876 Hypokalemia: Secondary | ICD-10-CM | POA: Insufficient documentation

## 2018-08-29 DIAGNOSIS — R059 Cough, unspecified: Secondary | ICD-10-CM

## 2018-08-29 DIAGNOSIS — E860 Dehydration: Secondary | ICD-10-CM | POA: Insufficient documentation

## 2018-08-29 DIAGNOSIS — J111 Influenza due to unidentified influenza virus with other respiratory manifestations: Secondary | ICD-10-CM | POA: Insufficient documentation

## 2018-08-29 DIAGNOSIS — R05 Cough: Secondary | ICD-10-CM | POA: Insufficient documentation

## 2018-08-29 LAB — CBC WITH DIFFERENTIAL/PLATELET
Abs Immature Granulocytes: 0 10*3/uL (ref 0.00–0.07)
BASOS ABS: 0.1 10*3/uL (ref 0.0–0.1)
Basophils Relative: 1 %
EOS ABS: 0 10*3/uL (ref 0.0–0.5)
EOS PCT: 0 %
HEMATOCRIT: 39.2 % (ref 36.0–46.0)
Hemoglobin: 13.5 g/dL (ref 12.0–15.0)
LYMPHS ABS: 0.3 10*3/uL — AB (ref 0.7–4.0)
LYMPHS PCT: 4 %
MCH: 28.2 pg (ref 26.0–34.0)
MCHC: 34.4 g/dL (ref 30.0–36.0)
MCV: 82 fL (ref 80.0–100.0)
Monocytes Absolute: 0.4 10*3/uL (ref 0.1–1.0)
Monocytes Relative: 5 %
NEUTROS PCT: 90 %
NRBC: 0 % (ref 0.0–0.2)
NRBC: 0 /100{WBCs}
Neutro Abs: 7.5 10*3/uL (ref 1.7–7.7)
Platelets: 212 10*3/uL (ref 150–400)
RBC: 4.78 MIL/uL (ref 3.87–5.11)
RDW: 14.5 % (ref 11.5–15.5)
WBC: 8.3 10*3/uL (ref 4.0–10.5)

## 2018-08-29 LAB — COMPREHENSIVE METABOLIC PANEL
ALT: 24 U/L (ref 0–44)
ANION GAP: 15 (ref 5–15)
AST: 23 U/L (ref 15–41)
Albumin: 4.5 g/dL (ref 3.5–5.0)
Alkaline Phosphatase: 59 U/L (ref 38–126)
BILIRUBIN TOTAL: 2 mg/dL — AB (ref 0.3–1.2)
BUN: <5 mg/dL — ABNORMAL LOW (ref 6–20)
CO2: 17 mmol/L — ABNORMAL LOW (ref 22–32)
Calcium: 9.7 mg/dL (ref 8.9–10.3)
Chloride: 106 mmol/L (ref 98–111)
Creatinine, Ser: 0.46 mg/dL (ref 0.44–1.00)
GFR calc Af Amer: >60 mL/min (ref >60)
Glucose, Bld: 111 mg/dL — ABNORMAL HIGH (ref 70–99)
POTASSIUM: 2.6 mmol/L — AB (ref 3.5–5.1)
Sodium: 138 mmol/L (ref 135–145)
TOTAL PROTEIN: 8.4 g/dL — AB (ref 6.5–8.1)

## 2018-08-29 LAB — URINALYSIS, ROUTINE W REFLEX MICROSCOPIC
Bilirubin Urine: NEGATIVE
Glucose, UA: NEGATIVE mg/dL
Hgb urine dipstick: NEGATIVE
KETONES UR: 5 mg/dL — AB
LEUKOCYTES UA: NEGATIVE
NITRITE: NEGATIVE
Protein, ur: NEGATIVE mg/dL
Specific Gravity, Urine: 1.006 (ref 1.005–1.030)
pH: 7 (ref 5.0–8.0)

## 2018-08-29 LAB — BASIC METABOLIC PANEL
Anion gap: 8 (ref 5–15)
BUN: <5 mg/dL — ABNORMAL LOW (ref 6–20)
CO2: 18 mmol/L — AB (ref 22–32)
Calcium: 7.8 mg/dL — ABNORMAL LOW (ref 8.9–10.3)
Chloride: 112 mmol/L — ABNORMAL HIGH (ref 98–111)
Creatinine, Ser: 0.41 mg/dL — ABNORMAL LOW (ref 0.44–1.00)
GFR calc Af Amer: >60 mL/min (ref >60)
GFR calc non Af Amer: >60 mL/min (ref >60)
Glucose, Bld: 94 mg/dL (ref 70–99)
Potassium: 3.1 mmol/L — ABNORMAL LOW (ref 3.5–5.1)
Sodium: 138 mmol/L (ref 135–145)

## 2018-08-29 LAB — LIPASE, BLOOD: LIPASE: 27 U/L (ref 11–51)

## 2018-08-29 MED ORDER — POTASSIUM CHLORIDE CRYS ER 20 MEQ PO TBCR
60.0000 meq | EXTENDED_RELEASE_TABLET | Freq: Once | ORAL | Status: AC
  Administered 2018-08-29: 60 meq via ORAL
  Filled 2018-08-29: qty 3

## 2018-08-29 MED ORDER — POTASSIUM CHLORIDE 10 MEQ/100ML IV SOLN
10.0000 meq | Freq: Once | INTRAVENOUS | Status: AC
  Administered 2018-08-29: 10 meq via INTRAVENOUS
  Filled 2018-08-29: qty 100

## 2018-08-29 MED ORDER — SODIUM CHLORIDE 0.9 % IV BOLUS
2000.0000 mL | Freq: Once | INTRAVENOUS | Status: AC
  Administered 2018-08-29: 1000 mL via INTRAVENOUS

## 2018-08-29 MED ORDER — POTASSIUM CHLORIDE ER 10 MEQ PO TBCR: 10.0000 meq | EXTENDED_RELEASE_TABLET | Freq: Three times a day (TID) | ORAL | 0 refills | Status: DC

## 2018-08-29 MED ORDER — SODIUM CHLORIDE 0.9 % IV SOLN: INTRAVENOUS | Status: DC

## 2018-08-29 MED ORDER — ACETAMINOPHEN 325 MG PO TABS
650.0000 mg | ORAL_TABLET | Freq: Once | ORAL | Status: AC
  Administered 2018-08-29: 650 mg via ORAL
  Filled 2018-08-29: qty 2

## 2018-08-29 MED ORDER — OSELTAMIVIR PHOSPHATE 75 MG PO CAPS: 75.0000 mg | ORAL_CAPSULE | Freq: Two times a day (BID) | ORAL | 0 refills | Status: DC

## 2018-08-29 MED ORDER — ONDANSETRON HCL 4 MG/2ML IJ SOLN
4.0000 mg | Freq: Once | INTRAMUSCULAR | Status: AC
  Administered 2018-08-29: 4 mg via INTRAVENOUS
  Filled 2018-08-29: qty 2

## 2018-08-29 NOTE — ED Triage Notes (Signed)
Pt with c/o fever, SOB upon ambulating, tightness of chest, starting last night; pt c/o vomiting and diarrhea

## 2018-08-29 NOTE — ED Provider Notes (Signed)
[facility] Note   CSN: [id number] Arrival date & time: 08/29/18  [id number]     History   Chief Complaint Chief Complaint  Patient presents with  . Weakness    w/ multiple complaints (abd pn, low K+, N/V)    HPI Sara Hill is a 28 y.o. female.  28 year old female presents with nausea vomiting and watery diarrhea x1 day.  Has a history of hypokalemia and feels that she is having recurrence.  Has had nonproductive cough without dyspnea.  No neck pain, photophobia.  Has had congestion and runny nose.  Mild bitemporal headache without focal neurological deficits.  Symptoms persistent nothing makes them better worse no treatment use prior to arrival     Past Medical History:  Diagnosis Date  . Gall stones   . Low blood potassium     Patient Active Problem List   Diagnosis Date Noted  . Protein-calorie malnutrition, severe 06/29/2018  . Hypokalemia 06/27/2018    Past Surgical History:  Procedure Laterality Date  . CHOLECYSTECTOMY    . COLONOSCOPY  01/2018   normal  . ESOPHAGOGASTRODUODENOSCOPY  01/2018   normal     OB History   No obstetric history on file.      Home Medications    Prior to Admission medications   Medication Sig Start Date End Date Taking? Authorizing Provider  dicyclomine (BENTYL) 10 MG capsule Take 1 capsule (10 mg total) by mouth 3 (three) times daily before meals. 06/28/18   Milagros LollSudini, Srikar, MD  Pancrelipase, Lip-Prot-Amyl, 24000-76000 units CPEP Take 1 capsule (24,000 Units total) by mouth 3 (three) times daily with meals. 07/02/18   Bing NeighborsHarris, Kimberly S, FNP  potassium chloride SA (K-DUR,KLOR-CON) 20 MEQ tablet Take 2 tablets (40 mEq total) by mouth 2 (two) times daily. Take two tablets twice a day for 1 week and then change to once a day 06/29/18   Enid BaasKalisetti, Radhika, MD  ursodiol (ACTIGALL) 300 MG capsule Take 1 capsule (300 mg total) by mouth daily. 06/28/18   Milagros LollSudini, Srikar, MD     Family History Family History  Problem Relation Age of Onset  . Healthy Mother   . Diabetes Mother     Social History Social History   Tobacco Use  . Smoking status: Never Smoker  . Smokeless tobacco: Never Used  Substance Use Topics  . Alcohol use: No  . Drug use: Yes    Types: Marijuana     Allergies   Benadryl [diphenhydramine hcl]   Review of Systems Review of Systems  All other systems reviewed and are negative.    Physical Exam Updated Vital Signs Temp 99.5 F (37.5 C) (Oral)   Ht 1.524 m (5')   Wt 35.8 kg   BMI 15.43 kg/m   Physical Exam Vitals signs and nursing note reviewed.  Constitutional:      General: She is not in acute distress.    Appearance: Normal appearance. She is well-developed. She is not toxic-appearing.  HENT:     Head: Normocephalic and atraumatic.  Eyes:     General: Lids are normal.     Conjunctiva/sclera: Conjunctivae normal.     Pupils: Pupils are equal, round, and reactive to light.  Neck:     Musculoskeletal: Normal range of motion and neck supple. No neck rigidity or pain with movement.     Thyroid: No thyroid mass.     Trachea: No tracheal deviation.  Cardiovascular:     Rate and Rhythm: Normal rate  and regular rhythm.     Heart sounds: Normal heart sounds. No murmur. No gallop.   Pulmonary:     Effort: Pulmonary effort is normal. No respiratory distress.     Breath sounds: Normal breath sounds. No stridor. No decreased breath sounds, wheezing, rhonchi or rales.  Abdominal:     General: Bowel sounds are normal. There is no distension.     Palpations: Abdomen is soft.     Tenderness: There is no abdominal tenderness. There is no rebound.  Musculoskeletal: Normal range of motion.        General: No tenderness.  Skin:    General: Skin is warm and dry.     Findings: No abrasion or rash.  Neurological:     Mental Status: She is alert and oriented to person, place, and time.     GCS: GCS eye subscore is 4. GCS  verbal subscore is 5. GCS motor subscore is 6.     Cranial Nerves: No cranial nerve deficit.     Sensory: No sensory deficit.  Psychiatric:        Speech: Speech normal.        Behavior: Behavior normal.      ED Treatments / Results  Labs (all labs ordered are listed, but only abnormal results are displayed) Labs Reviewed  URINE CULTURE  CBC WITH DIFFERENTIAL/PLATELET  COMPREHENSIVE METABOLIC PANEL  LIPASE, BLOOD  URINALYSIS, ROUTINE W REFLEX MICROSCOPIC    EKG None  Radiology No results found.  Procedures Procedures (including critical care time)  Medications Ordered in ED Medications  sodium chloride 0.9 % bolus 2,000 mL (has no administration in time range)  0.9 %  sodium chloride infusion (has no administration in time range)  ondansetron (ZOFRAN) injection 4 mg (has no administration in time range)     Initial Impression / Assessment and Plan / ED Course  I have reviewed the triage vital signs and the nursing notes.  Pertinent labs & imaging results that were available during my care of the patient were reviewed by me and considered in my medical decision making (see chart for details).     Patient with moderate hypokalemia here that was treated with oral potassium as well as IV potassium.  Also evidence of dehydration and was treated with IV fluids.  Patient feels well at this time.  Suspect that she has influenza as well as dehydration will place on Tamiflu as well as short course of oral potassium tablets.  Final Clinical Impressions(s) / ED Diagnoses   Final diagnoses:  Cough    ED Discharge Orders    None       Lorre NickAllen, Deundra Bard, MD 08/29/18 1250

## 2018-08-29 NOTE — ED Notes (Signed)
Pt moved to progressive bed. Pt states "If I have to be in the hallway the whole time I am just going to go somewhere else". RN informed.

## 2018-08-30 LAB — URINE CULTURE: Culture: NO GROWTH

## 2018-09-03 ENCOUNTER — Other Ambulatory Visit: Payer: Self-pay

## 2018-09-03 ENCOUNTER — Emergency Department (HOSPITAL_COMMUNITY): Payer: Self-pay

## 2018-09-03 ENCOUNTER — Emergency Department (HOSPITAL_COMMUNITY)
Admission: EM | Admit: 2018-09-03 | Discharge: 2018-09-03 | Disposition: A | Discharge status: Home / Self Care | Payer: Self-pay | Attending: Emergency Medicine | Admitting: Emergency Medicine

## 2018-09-03 ENCOUNTER — Encounter (HOSPITAL_COMMUNITY): Payer: Self-pay

## 2018-09-03 DIAGNOSIS — J069 Acute upper respiratory infection, unspecified: Secondary | ICD-10-CM | POA: Insufficient documentation

## 2018-09-03 DIAGNOSIS — Z79899 Other long term (current) drug therapy: Secondary | ICD-10-CM | POA: Insufficient documentation

## 2018-09-03 DIAGNOSIS — R490 Dysphonia: Secondary | ICD-10-CM

## 2018-09-03 MED ORDER — DEXAMETHASONE 4 MG PO TABS
8.0000 mg | ORAL_TABLET | Freq: Once | ORAL | Status: AC
  Administered 2018-09-03: 8 mg via ORAL
  Filled 2018-09-03: qty 2

## 2018-09-03 NOTE — ED Notes (Signed)
Patient transported to X-ray 

## 2018-09-03 NOTE — ED Triage Notes (Addendum)
Patient c/o a productive cough, chest wall pain, and hoarseness x 5 days. Patient states she was diagnosed with the flu 5 days ago and continues to cough.

## 2018-09-07 NOTE — ED Provider Notes (Signed)
[facility]-EMERGENCY DEPT Provider Note   CSN: [id number] Arrival date & time: 09/03/18  3005     History   Chief Complaint Chief Complaint  Patient presents with  . Cough  . Hoarse  . chest wall pain    HPI Sara Hill is a 28 y.o. female.  HPI   28 year old female with continued productive cough, sharp chest pain and hoarseness.  Symptoms began almost a week ago.  Seen in the emergency room recently for the same.  Diagnosed with possible influenza.  Generalized weakness has improved but she continues to cough and she is having pain in her anterior chest with this.  No fevers.  No unusual leg pain or swelling.  No hemoptysis.  Past Medical History:  Diagnosis Date  . Gall stones   . Low blood potassium     Patient Active Problem List   Diagnosis Date Noted  . Protein-calorie malnutrition, severe 06/29/2018  . Hypokalemia 06/27/2018    Past Surgical History:  Procedure Laterality Date  . CHOLECYSTECTOMY    . COLONOSCOPY  01/2018   normal  . ESOPHAGOGASTRODUODENOSCOPY  01/2018   normal     OB History   No obstetric history on file.      Home Medications    Prior to Admission medications   Medication Sig Start Date End Date Taking? Authorizing Provider  acetaminophen (TYLENOL) 500 MG tablet Take 1,000 mg by mouth every 6 (six) hours as needed for mild pain or moderate pain.   Yes [provider]  dicyclomine (BENTYL) 10 MG capsule Take 1 capsule (10 mg total) by mouth 3 (three) times daily before meals. 06/28/18  Yes Milagros Loll, MD  oseltamivir (TAMIFLU) 75 MG capsule Take 1 capsule (75 mg total) by mouth every 12 (twelve) hours. 08/29/18  Yes Lorre Nick, MD  potassium chloride (K-DUR) 10 MEQ tablet Take 1 tablet (10 mEq total) by mouth 3 (three) times daily. 08/29/18  Yes Lorre Nick, MD  potassium chloride SA (K-DUR,KLOR-CON) 20 MEQ tablet Take 2 tablets (40 mEq total) by mouth 2 (two) times daily. Take two  tablets twice a day for 1 week and then change to once a day 06/29/18  Yes Enid Baas, MD  Pancrelipase, Lip-Prot-Amyl, 24000-76000 units CPEP Take 1 capsule (24,000 Units total) by mouth 3 (three) times daily with meals. Patient not taking: Reported on 08/29/2018 07/02/18   Bing Neighbors, FNP  ursodiol (ACTIGALL) 300 MG capsule Take 1 capsule (300 mg total) by mouth daily. Patient not taking: Reported on 09/03/2018 06/28/18   Milagros Loll, MD    Family History Family History  Problem Relation Age of Onset  . Healthy Mother   . Diabetes Mother     Social History Social History   Tobacco Use  . Smoking status: Never Smoker  . Smokeless tobacco: Never Used  Substance Use Topics  . Alcohol use: No  . Drug use: Yes    Types: Marijuana     Allergies   Benadryl [diphenhydramine hcl]   Review of Systems Review of Systems  All systems reviewed and negative, other than as noted in HPI. Physical Exam Updated Vital Signs BP 99/71   Pulse 95   Temp 98.4 F (36.9 C) (Oral)   Resp 18   Ht 5' (1.524 m)   Wt 35.4 kg   LMP 08/30/2018   SpO2 100%   BMI 15.23 kg/m   Physical Exam Vitals signs and nursing note reviewed.  Constitutional:  General: She is not in acute distress.    Appearance: She is well-developed.  HENT:     Head: Normocephalic and atraumatic.  Eyes:     General:        Right eye: No discharge.        Left eye: No discharge.     Conjunctiva/sclera: Conjunctivae normal.  Neck:     Musculoskeletal: Neck supple.  Cardiovascular:     Rate and Rhythm: Normal rate and regular rhythm.     Heart sounds: Normal heart sounds. No murmur. No friction rub. No gallop.   Pulmonary:     Effort: Pulmonary effort is normal. No respiratory distress.     Breath sounds: Normal breath sounds.  Abdominal:     General: There is no distension.     Palpations: Abdomen is soft.     Tenderness: There is no abdominal tenderness.  Musculoskeletal:         General: No tenderness.     Comments: Lower extremities symmetric as compared to each other. No calf tenderness. Negative Homan's. No palpable cords.   Skin:    General: Skin is warm and dry.  Neurological:     Mental Status: She is alert.  Psychiatric:        Behavior: Behavior normal.        Thought Content: Thought content normal.      ED Treatments / Results  Labs (all labs ordered are listed, but only abnormal results are displayed) Labs Reviewed - No data to display  EKG None  Radiology No results found.  Procedures Procedures (including critical care time)  Medications Ordered in ED Medications  dexamethasone (DECADRON) tablet 8 mg (8 mg Oral Given 09/03/18 0930)     Initial Impression / Assessment and Plan / ED Course  I have reviewed the triage vital signs and the nursing notes.  Pertinent labs & imaging results that were available during my care of the patient were reviewed by me and considered in my medical decision making (see chart for details).     28 year old female with chest pain.  Likely secondary to bronchitis/airway irritation.  Atypical for ACS.  Doubt PE, dissection or other emergent process.  Plan symptomatic treatment of her cough and PRN NSAIDs for the pain.  Return precautions discussed. Final Clinical Impressions(s) / ED Diagnoses   Final diagnoses:  Hoarseness of voice  Upper respiratory tract infection, unspecified type    ED Discharge Orders    None       Raeford Razor, MD 09/07/18 1159

## 2018-11-01 ENCOUNTER — Inpatient Hospital Stay (HOSPITAL_COMMUNITY)
Admission: EM | Admit: 2018-11-01 | Discharge: 2018-11-02 | DRG: 640 | Discharge status: Against Medical Advice | Payer: Self-pay | Attending: Internal Medicine | Admitting: Internal Medicine

## 2018-11-01 ENCOUNTER — Encounter (HOSPITAL_COMMUNITY): Payer: Self-pay | Admitting: Emergency Medicine

## 2018-11-01 ENCOUNTER — Other Ambulatory Visit: Payer: Self-pay

## 2018-11-01 DIAGNOSIS — R197 Diarrhea, unspecified: Secondary | ICD-10-CM | POA: Diagnosis present

## 2018-11-01 DIAGNOSIS — R52 Pain, unspecified: Secondary | ICD-10-CM

## 2018-11-01 DIAGNOSIS — Y929 Unspecified place or not applicable: Secondary | ICD-10-CM

## 2018-11-01 DIAGNOSIS — Z681 Body mass index (BMI) 19 or less, adult: Secondary | ICD-10-CM

## 2018-11-01 DIAGNOSIS — Z599 Problem related to housing and economic circumstances, unspecified: Secondary | ICD-10-CM

## 2018-11-01 DIAGNOSIS — R1011 Right upper quadrant pain: Secondary | ICD-10-CM | POA: Diagnosis present

## 2018-11-01 DIAGNOSIS — E860 Dehydration: Secondary | ICD-10-CM | POA: Diagnosis present

## 2018-11-01 DIAGNOSIS — Z791 Long term (current) use of non-steroidal anti-inflammatories (NSAID): Secondary | ICD-10-CM

## 2018-11-01 DIAGNOSIS — T475X6A Underdosing of digestants, initial encounter: Secondary | ICD-10-CM | POA: Diagnosis present

## 2018-11-01 DIAGNOSIS — Z9049 Acquired absence of other specified parts of digestive tract: Secondary | ICD-10-CM

## 2018-11-01 DIAGNOSIS — Z79899 Other long term (current) drug therapy: Secondary | ICD-10-CM

## 2018-11-01 DIAGNOSIS — R112 Nausea with vomiting, unspecified: Secondary | ICD-10-CM

## 2018-11-01 DIAGNOSIS — R1013 Epigastric pain: Secondary | ICD-10-CM

## 2018-11-01 DIAGNOSIS — K529 Noninfective gastroenteritis and colitis, unspecified: Secondary | ICD-10-CM | POA: Diagnosis present

## 2018-11-01 DIAGNOSIS — R109 Unspecified abdominal pain: Secondary | ICD-10-CM | POA: Diagnosis present

## 2018-11-01 DIAGNOSIS — Z5329 Procedure and treatment not carried out because of patient's decision for other reasons: Secondary | ICD-10-CM | POA: Diagnosis not present

## 2018-11-01 DIAGNOSIS — Z833 Family history of diabetes mellitus: Secondary | ICD-10-CM

## 2018-11-01 DIAGNOSIS — I4510 Unspecified right bundle-branch block: Secondary | ICD-10-CM | POA: Diagnosis present

## 2018-11-01 DIAGNOSIS — G8929 Other chronic pain: Secondary | ICD-10-CM | POA: Diagnosis present

## 2018-11-01 DIAGNOSIS — E876 Hypokalemia: Principal | ICD-10-CM | POA: Diagnosis present

## 2018-11-01 DIAGNOSIS — Z888 Allergy status to other drugs, medicaments and biological substances status: Secondary | ICD-10-CM

## 2018-11-01 DIAGNOSIS — N200 Calculus of kidney: Secondary | ICD-10-CM | POA: Diagnosis present

## 2018-11-01 DIAGNOSIS — E43 Unspecified severe protein-calorie malnutrition: Secondary | ICD-10-CM | POA: Diagnosis present

## 2018-11-01 DIAGNOSIS — Z91120 Patient's intentional underdosing of medication regimen due to financial hardship: Secondary | ICD-10-CM

## 2018-11-01 DIAGNOSIS — R531 Weakness: Secondary | ICD-10-CM

## 2018-11-01 LAB — COMPREHENSIVE METABOLIC PANEL
ALT: 21 U/L (ref 0–44)
AST: 25 U/L (ref 15–41)
Albumin: 4.9 g/dL (ref 3.5–5.0)
Alkaline Phosphatase: 84 U/L (ref 38–126)
Anion gap: 10 (ref 5–15)
BUN: 7 mg/dL (ref 6–20)
CO2: 24 mmol/L (ref 22–32)
Calcium: 9.5 mg/dL (ref 8.9–10.3)
Chloride: 103 mmol/L (ref 98–111)
Creatinine, Ser: 0.46 mg/dL (ref 0.44–1.00)
GFR calc Af Amer: >60 mL/min (ref >60)
GFR calc non Af Amer: >60 mL/min (ref >60)
Glucose, Bld: 108 mg/dL — ABNORMAL HIGH (ref 70–99)
Sodium: 137 mmol/L (ref 135–145)
Total Bilirubin: 1.6 mg/dL — ABNORMAL HIGH (ref 0.3–1.2)
Total Protein: 8.9 g/dL — ABNORMAL HIGH (ref 6.5–8.1)

## 2018-11-01 LAB — URINALYSIS, ROUTINE W REFLEX MICROSCOPIC
Bacteria, UA: NONE SEEN
Bilirubin Urine: NEGATIVE
Glucose, UA: NEGATIVE mg/dL
Hgb urine dipstick: NEGATIVE
Ketones, ur: NEGATIVE mg/dL
Nitrite: NEGATIVE
Protein, ur: NEGATIVE mg/dL
Specific Gravity, Urine: 1.006 (ref 1.005–1.030)
pH: 8 (ref 5.0–8.0)

## 2018-11-01 LAB — CBC WITH DIFFERENTIAL/PLATELET
Abs Immature Granulocytes: 0.03 10*3/uL (ref 0.00–0.07)
Basophils Absolute: 0.1 10*3/uL (ref 0.0–0.1)
Basophils Relative: 1 %
Eosinophils Absolute: 0 10*3/uL (ref 0.0–0.5)
Eosinophils Relative: 0 %
HCT: 43.2 % (ref 36.0–46.0)
Hemoglobin: 15 g/dL (ref 12.0–15.0)
Immature Granulocytes: 0 %
Lymphocytes Relative: 23 %
Lymphs Abs: 1.9 10*3/uL (ref 0.7–4.0)
MCH: 28.5 pg (ref 26.0–34.0)
MCHC: 34.7 g/dL (ref 30.0–36.0)
MCV: 82.1 fL (ref 80.0–100.0)
Monocytes Absolute: 0.6 10*3/uL (ref 0.1–1.0)
Monocytes Relative: 8 %
NEUTROS ABS: 5.6 10*3/uL (ref 1.7–7.7)
Neutrophils Relative %: 68 %
Platelets: 273 10*3/uL (ref 150–400)
RBC: 5.26 MIL/uL — ABNORMAL HIGH (ref 3.87–5.11)
RDW: 15.5 % (ref 11.5–15.5)
WBC: 8.2 10*3/uL (ref 4.0–10.5)
nRBC: 0 % (ref 0.0–0.2)

## 2018-11-01 LAB — MAGNESIUM: Magnesium: 2.2 mg/dL (ref 1.7–2.4)

## 2018-11-01 LAB — LIPASE, BLOOD: Lipase: 34 U/L (ref 11–51)

## 2018-11-01 MED ORDER — HYDROMORPHONE HCL 1 MG/ML IJ SOLN
1.0000 mg | Freq: Once | INTRAMUSCULAR | Status: AC
  Administered 2018-11-01: 1 mg via INTRAVENOUS
  Filled 2018-11-01: qty 1

## 2018-11-01 MED ORDER — ONDANSETRON HCL 4 MG/2ML IJ SOLN
4.0000 mg | Freq: Once | INTRAMUSCULAR | Status: AC
  Administered 2018-11-01: 4 mg via INTRAVENOUS
  Filled 2018-11-01: qty 2

## 2018-11-01 MED ORDER — SODIUM CHLORIDE 0.9 % IV BOLUS
500.0000 mL | Freq: Once | INTRAVENOUS | Status: AC
  Administered 2018-11-01: 500 mL via INTRAVENOUS

## 2018-11-01 MED ORDER — ENSURE ENLIVE PO LIQD: 237.0000 mL | Freq: Two times a day (BID) | ORAL | Status: DC

## 2018-11-01 MED ORDER — ENOXAPARIN SODIUM 30 MG/0.3ML ~~LOC~~ SOLN: 30.0000 mg | SUBCUTANEOUS | Status: DC

## 2018-11-01 MED ORDER — POTASSIUM CHLORIDE CRYS ER 20 MEQ PO TBCR
40.0000 meq | EXTENDED_RELEASE_TABLET | Freq: Once | ORAL | Status: AC
  Administered 2018-11-01: 40 meq via ORAL
  Filled 2018-11-01: qty 2

## 2018-11-01 MED ORDER — KETOROLAC TROMETHAMINE 15 MG/ML IJ SOLN
15.0000 mg | Freq: Four times a day (QID) | INTRAMUSCULAR | Status: DC | PRN
  Administered 2018-11-01 – 2018-11-02 (×3): 15 mg via INTRAVENOUS
  Filled 2018-11-01 (×3): qty 1

## 2018-11-01 MED ORDER — POTASSIUM CHLORIDE 10 MEQ/100ML IV SOLN
10.0000 meq | INTRAVENOUS | Status: AC
  Administered 2018-11-01 (×4): 10 meq via INTRAVENOUS
  Filled 2018-11-01 (×4): qty 100

## 2018-11-01 MED ORDER — SODIUM CHLORIDE (PF) 0.9 % IJ SOLN
INTRAMUSCULAR | Status: AC
  Filled 2018-11-01: qty 50

## 2018-11-01 MED ORDER — DICYCLOMINE HCL 10 MG PO CAPS: 10.0000 mg | ORAL_CAPSULE | Freq: Three times a day (TID) | ORAL | Status: DC

## 2018-11-01 MED ORDER — SODIUM CHLORIDE 0.9% FLUSH
3.0000 mL | Freq: Two times a day (BID) | INTRAVENOUS | Status: DC
  Administered 2018-11-02: 3 mL via INTRAVENOUS

## 2018-11-01 MED ORDER — ONDANSETRON HCL 4 MG/2ML IJ SOLN
4.0000 mg | Freq: Four times a day (QID) | INTRAMUSCULAR | Status: DC | PRN
  Administered 2018-11-01 – 2018-11-02 (×2): 4 mg via INTRAVENOUS
  Filled 2018-11-01 (×2): qty 2

## 2018-11-01 MED ORDER — ONDANSETRON HCL 4 MG PO TABS: 4.0000 mg | ORAL_TABLET | Freq: Four times a day (QID) | ORAL | Status: DC | PRN

## 2018-11-01 MED ORDER — POTASSIUM CHLORIDE CRYS ER 20 MEQ PO TBCR
40.0000 meq | EXTENDED_RELEASE_TABLET | ORAL | Status: AC
  Administered 2018-11-02: 40 meq via ORAL
  Filled 2018-11-01: qty 2

## 2018-11-01 MED ORDER — SODIUM CHLORIDE 0.9 % IV BOLUS
1000.0000 mL | Freq: Once | INTRAVENOUS | Status: AC
  Administered 2018-11-01: 1000 mL via INTRAVENOUS

## 2018-11-01 MED ORDER — SODIUM CHLORIDE 0.9 % IV SOLN
INTRAVENOUS | Status: DC
  Administered 2018-11-01: 15:00:00 via INTRAVENOUS

## 2018-11-01 MED ORDER — MAGNESIUM SULFATE 2 GM/50ML IV SOLN
2.0000 g | Freq: Once | INTRAVENOUS | Status: AC
  Administered 2018-11-01: 2 g via INTRAVENOUS
  Filled 2018-11-01: qty 50

## 2018-11-01 MED ORDER — TRAMADOL HCL 50 MG PO TABS: 50.0000 mg | ORAL_TABLET | Freq: Four times a day (QID) | ORAL | Status: DC | PRN

## 2018-11-01 MED ORDER — POTASSIUM CHLORIDE IN NACL 40-0.9 MEQ/L-% IV SOLN
INTRAVENOUS | Status: DC
  Administered 2018-11-01 – 2018-11-02 (×2): 125 mL/h via INTRAVENOUS
  Filled 2018-11-01 (×4): qty 1000

## 2018-11-01 MED ORDER — PANCRELIPASE (LIP-PROT-AMYL) 12000-38000 UNITS PO CPEP: 24000.0000 [IU] | ORAL_CAPSULE | Freq: Three times a day (TID) | ORAL | Status: DC

## 2018-11-01 MED ORDER — ACETAMINOPHEN 650 MG RE SUPP: 650.0000 mg | Freq: Four times a day (QID) | RECTAL | Status: DC | PRN

## 2018-11-01 MED ORDER — ACETAMINOPHEN 325 MG PO TABS: 650.0000 mg | ORAL_TABLET | Freq: Four times a day (QID) | ORAL | Status: DC | PRN

## 2018-11-01 MED ORDER — ADULT MULTIVITAMIN W/MINERALS CH: 1.0000 | ORAL_TABLET | Freq: Every day | ORAL | Status: DC

## 2018-11-01 MED ORDER — URSODIOL 300 MG PO CAPS
300.0000 mg | ORAL_CAPSULE | Freq: Every day | ORAL | Status: DC
  Filled 2018-11-01: qty 1

## 2018-11-01 NOTE — ED Notes (Signed)
Pt threw up 1 K pill , and took a total of 20 meq for one pill

## 2018-11-01 NOTE — ED Triage Notes (Signed)
Patient c/o central abdominal pain with diarrhea since yesterday. Reports taking bentyl with no relief.

## 2018-11-01 NOTE — Progress Notes (Signed)
Pt received oral CT contrast to drink this evening. After drinking ~1/4 of the contrast before she had an episode of emesis. Stated she can't keep anything down. PRN zofran given and radiology notified to see if scan can be moved or postponed until N&V is under control and pt is able to drink the remaining contrast.

## 2018-11-01 NOTE — H&P (Signed)
History and Physical    Sara Hill ZOX:[id number] DOB: 27-Jun-1991 DOA: 11/01/2018  PCP: Bing Neighbors, FNP Patient coming from: Home  I have personally briefly reviewed patient's old medical records in [facility]  Chief Complaint: Abdominal pain/diarrhea/weakness  HPI: Sara Hill is a 28 y.o. female with medical history significant of hyponatremia, chronic diarrhea, status post cholecystectomy presented to the ED with a 2-day history of upper abdominal pain nonradiating which is like a hunger pain per patient causing her to sometimes be doubled over.  Patient also endorses a 2-day history of worsening diarrhea and a 5 to 6-day history of worsening generalized weakness.  Patient does endorse nausea 2 episodes of emesis however none since in the ED.  Patient denies any fever, no chills, no shortness of breath, no chest pain, no cough, no melena, no hematemesis, no hematochezia, no constipation, no asymmetric weakness or numbness.  No syncopal episodes.  Patient does endorse decreased oral intake and decreased appetite.  Patient states is on oral potassium supplementation at 20 mEq 3 times daily and has been compliant with this regimen.  Patient states abdominal pain and diarrhea started after her cholecystectomy in 2014.  Patient states had been on Creon before in the past with significant improvement with his symptoms however due to financial issues has not been able to afford the Creon lately.  Patient denies any history of eating disorders.  ED Course: Patient seen in the ED and comprehensive metabolic profile obtained with a potassium of less than 2, glucose of 108, protein of 8.9, bilirubin of 1.6 otherwise was within normal limits.  CBC was unremarkable.  Magnesium level pending.  Urinalysis was unremarkable.  No imaging was done.  EKG with incomplete right bundle branch block unchanged from prior EKG.  Patient given 20 mEq of oral potassium and placed on IV potassium  runs.  Patient given some IV fluids as well as magnesium 2 g IV x1.  Hospitalist were called to admit the patient for further evaluation and management.  Review of Systems: As per HPI otherwise 10 point review of systems negative.   Past Medical History:  Diagnosis Date   Gall stones    Low blood potassium     Past Surgical History:  Procedure Laterality Date   CHOLECYSTECTOMY     COLONOSCOPY  01/2018   normal   ESOPHAGOGASTRODUODENOSCOPY  01/2018   normal     reports that she has never smoked. She has never used smokeless tobacco. She reports current drug use. Drug: Marijuana. She reports that she does not drink alcohol.  Allergies  Allergen Reactions   Benadryl [Diphenhydramine Hcl] Itching and Swelling    Pt reeports "feels like throat swelling".     Family History  Problem Relation Age of Onset   Healthy Mother    Diabetes Mother    Patient states she was in a foster home and as such has no information on the medical history of her biological parents.  Prior to Admission medications   Medication Sig Start Date End Date Taking? Authorizing Provider  acetaminophen (TYLENOL) 500 MG tablet Take 1,000 mg by mouth every 6 (six) hours as needed for mild pain or moderate pain.   Yes [provider]  dicyclomine (BENTYL) 10 MG capsule Take 1 capsule (10 mg total) by mouth 3 (three) times daily before meals. 06/28/18  Yes Sudini, Wardell Heath, MD  ibuprofen (ADVIL,MOTRIN) 200 MG tablet Take 400 mg by mouth every 6 (six) hours as needed.   Yes  [provider]  potassium chloride (K-DUR) 10 MEQ tablet Take 1 tablet (10 mEq total) by mouth 3 (three) times daily. 08/29/18  Yes Lorre Nick, MD  senna (SENOKOT) 8.6 MG tablet Take 1 tablet by mouth daily.   Yes [provider]  oseltamivir (TAMIFLU) 75 MG capsule Take 1 capsule (75 mg total) by mouth every 12 (twelve) hours. Patient not taking: Reported on 11/01/2018 08/29/18   Lorre Nick, MD    Pancrelipase, Lip-Prot-Amyl, 24000-76000 units CPEP Take 1 capsule (24,000 Units total) by mouth 3 (three) times daily with meals. Patient not taking: Reported on 08/29/2018 07/02/18   Bing Neighbors, FNP  potassium chloride SA (K-DUR,KLOR-CON) 20 MEQ tablet Take 2 tablets (40 mEq total) by mouth 2 (two) times daily. Take two tablets twice a day for 1 week and then change to once a day Patient taking differently: Take 20 mEq by mouth 3 (three) times daily.  06/29/18   Enid Baas, MD  ursodiol (ACTIGALL) 300 MG capsule Take 1 capsule (300 mg total) by mouth daily. Patient not taking: Reported on 09/03/2018 06/28/18   Milagros Loll, MD    Physical Exam: Vitals:   11/01/18 1700 11/01/18 1730 11/01/18 1800 11/01/18 1826  BP: 104/67 102/71 103/77 100/60  Pulse: (!) 58 (!) 57 64 68  Resp: Temp:      TempSrc:      SpO2: 99% 99% 100% 100%    Constitutional: NAD, calm, comfortable.  Thin. Vitals:   11/01/18 1700 11/01/18 1730 11/01/18 1800 11/01/18 1826  BP: 104/67 102/71 103/77 100/60  Pulse: (!) 58 (!) 57 64 68  Resp: Temp:      TempSrc:      SpO2: 99% 99% 100% 100%   Eyes: PERRL, lids and conjunctivae normal ENMT: Mucous membranes are dry. Posterior pharynx clear of any exudate or lesions. Poor dentition.  Neck: normal, supple, no masses, no thyromegaly Respiratory: clear to auscultation bilaterally, no wheezing, no crackles. Normal respiratory effort. No accessory muscle use.  Cardiovascular: Regular rate and rhythm, no murmurs / rubs / gallops. No extremity edema. 2+ pedal pulses. No carotid bruits.  Abdomen: Soft, some tenderness to palpation in the epigastrium and right upper quadrant region.  Hyperactive bowel sounds.  No rebound.  No guarding.  Musculoskeletal: no clubbing / cyanosis. No joint deformity upper and lower extremities. Good ROM, no contractures. Normal muscle tone.  Skin: no rashes, lesions, ulcers. No induration Neurologic: CN  2-12 grossly intact. Sensation intact, DTR normal. Strength 5/5 in all 4.  Psychiatric: Normal judgment and insight. Alert and oriented x 3. Normal mood.   Labs on Admission: I have personally reviewed following labs and imaging studies  CBC: Recent Labs  Lab 11/01/18 1522  WBC 8.2  NEUTROABS 5.6  HGB 15.0  HCT 43.2  MCV 82.1  PLT 273   Basic Metabolic Panel: Recent Labs  Lab 11/01/18 1522  NA 137  K <2.0*  CL 103  CO2 24  GLUCOSE 108*  BUN 7  CREATININE 0.46  CALCIUM 9.5   GFR: CrCl cannot be calculated (Unknown ideal weight.). Liver Function Tests: Recent Labs  Lab 11/01/18 1522  AST 25  ALT 21  ALKPHOS 84  BILITOT 1.6*  PROT 8.9*  ALBUMIN 4.9   Recent Labs  Lab 11/01/18 1522  LIPASE 34   No results for input(s): AMMONIA in the last 168 hours. Coagulation Profile: No results for input(s): INR, PROTIME in the last  168 hours. Cardiac Enzymes: No results for input(s): CKTOTAL, CKMB, CKMBINDEX, TROPONINI in the last 168 hours. BNP (last 3 results) No results for input(s): PROBNP in the last 8760 hours. HbA1C: No results for input(s): HGBA1C in the last 72 hours. CBG: No results for input(s): GLUCAP in the last 168 hours. Lipid Profile: No results for input(s): CHOL, HDL, LDLCALC, TRIG, CHOLHDL, LDLDIRECT in the last 72 hours. Thyroid Function Tests: No results for input(s): TSH, T4TOTAL, FREET4, T3FREE, THYROIDAB in the last 72 hours. Anemia Panel: No results for input(s): VITAMINB12, FOLATE, FERRITIN, TIBC, IRON, RETICCTPCT in the last 72 hours. Urine analysis:    Component Value Date/Time   COLORURINE YELLOW 11/01/2018 1447   APPEARANCEUR CLEAR 11/01/2018 1447   LABSPEC 1.006 11/01/2018 1447   PHURINE 8.0 11/01/2018 1447   GLUCOSEU NEGATIVE 11/01/2018 1447   HGBUR NEGATIVE 11/01/2018 1447   BILIRUBINUR NEGATIVE 11/01/2018 1447   KETONESUR NEGATIVE 11/01/2018 1447   PROTEINUR NEGATIVE 11/01/2018 1447   UROBILINOGEN 1.0 11/26/2014 1435    NITRITE NEGATIVE 11/01/2018 1447   LEUKOCYTESUR TRACE (A) 11/01/2018 1447    Radiological Exams on Admission: No results found.  EKG: Independently reviewed.  Incomplete right bundle branch block.  No ischemic changes noted.  No significant change from prior EKG.  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  Secondary to GI losses.  Patient presented with chronic diarrhea which has been ongoing.  Patient noted to be on oral potassium supplementation of 20 mEq 3 times daily which she states she has been compliant with.  Patient presented with a 2 to 3-day history of acute on chronic abdominal pain and acute on chronic diarrhea which has been worsening with associated generalized weakness.  Patient denies any chest pain or syncopal episodes.  Patient denies any history of eating disorders.  Magnesium pending.  Patient has received 20 mEq of oral potassium in the ED.  4 rounds of 10 mEq KCl has been ordered per ED physician and pending.  Placed on normal saline with 40 mEq of KCl at 125 cc/h.  K. Dur 40 mEq p.o. every 4 hours x2 doses starting at 10 PM.  Monitor on telemetry.  Follow.  2.  Dehydration Secondary to GI losses from nausea vomiting and acute on chronic diarrhea.  IV fluids.  Supportive care.  3.  Acute on chronic diarrhea/abdominal pain.   Patient with prior history of acute on chronic diarrhea with abdominal pain.  States symptoms started after cholecystectomy.  Patient with prior CT abdomen and pelvis done 06/27/2018 with no evidence of acute intra-abdominal pelvic abnormality, status post cholecystectomy, numerous intrarenal calculi bilaterally without hydronephrosis.  Patient per prior discharge summary from [facility] on 06/29/2018 had EGD and colonoscopy from outside hospital per their note which was normal in June 2019.  Stool studies done during hospitalization in [facility] in November 2019 were negative.  HIV was negative.  Patient was started on ursodiol and Creon during that hospitalization with significant clinical improvement.  Patient states has been unable to afford her Creon and states that helped her before in the past.  Will repeat CT abdomen and pelvis to rule out any further intra-abdominal etiologies.  Patient has not been on any recent antibiotics.  Place on full  liquid diet.  Place on Creon.  Place on ursodiol and bentyl.  Consult with case management to see if patient can receive some help to get her Creon on discharge.  Antiemetics.  Supportive care.  4.  Severe protein calorie malnutrition Placed on a full liquid diet and if continued improvement advance to a regular diet.  Place on Ensure.  Consult with dietitian.  5.  Generalized weakness PT/OT.  DVT prophylaxis: Lovenox Code Status: Full Family Communication: Updated patient.  No family at bedside. Disposition Plan: Likely home once clinically improved, resolution of hypokalemia and ability to tolerate oral intake. Consults called: None Admission status: Admit to inpatient.  Patient with severe hypokalemia with potassium levels less than 2 with high risk for arrhythmias and decompensation requiring multiple modalities including IV for potassium supplementation likely requiring greater than 2 midnights.   Ramiro Harvest MD [facility]  If 7PM-7AM, please contact night-coverage [email or web address]  11/01/2018, 6:26 PM

## 2018-11-01 NOTE — ED Notes (Signed)
Date and time results received: 11/01/18 1627 (use smartphrase ".now" to insert current time)  Test: K+ Critical Value: <2.0  Name of Provider Notified: Dr. Bernell List  Orders Received? Or Actions Taken?: notified Dr [name]. Wentz of K+ <2.0.

## 2018-11-01 NOTE — ED Provider Notes (Signed)
[facility]-EMERGENCY DEPT Provider Note   CSN: [id number] Arrival date & time: 11/01/18  1432    History   Chief Complaint Chief Complaint  Patient presents with  . Abdominal Pain  . Diarrhea    HPI Sara Hill is a 28 y.o. female.     HPI   She presents for evaluation of abdominal pain started yesterday, and persistent, worsening without improvement after taking Bentyl.  History of same but none in the last several months.  She is vague and confused about her prior history.  States her PCP is in [place], [place] and she has not seen her recently.  She denies fever, chills, cough, shortness of breath, nausea, vomiting, weakness or dizziness.  She has had some diarrhea today.  There is been no blood in the bowel movements.  There are no other known modifying factors.  Past Medical History:  Diagnosis Date  . Gall stones   . Low blood potassium     Patient Active Problem List   Diagnosis Date Noted  . Protein-calorie malnutrition, severe 06/29/2018  . Hypokalemia 06/27/2018    Past Surgical History:  Procedure Laterality Date  . CHOLECYSTECTOMY    . COLONOSCOPY  01/2018   normal  . ESOPHAGOGASTRODUODENOSCOPY  01/2018   normal     OB History   No obstetric history on file.      Home Medications    Prior to Admission medications   Medication Sig Start Date End Date Taking? Authorizing Provider  acetaminophen (TYLENOL) 500 MG tablet Take 1,000 mg by mouth every 6 (six) hours as needed for mild pain or moderate pain.    [provider]  dicyclomine (BENTYL) 10 MG capsule Take 1 capsule (10 mg total) by mouth 3 (three) times daily before meals. 06/28/18   Milagros Loll, MD  oseltamivir (TAMIFLU) 75 MG capsule Take 1 capsule (75 mg total) by mouth every 12 (twelve) hours. 08/29/18   Lorre Nick, MD  Pancrelipase, Lip-Prot-Amyl, 24000-76000 units CPEP Take 1 capsule (24,000 Units total) by mouth 3 (three) times  daily with meals. Patient not taking: Reported on 08/29/2018 07/02/18   Bing Neighbors, FNP  potassium chloride (K-DUR) 10 MEQ tablet Take 1 tablet (10 mEq total) by mouth 3 (three) times daily. 08/29/18   Lorre Nick, MD  potassium chloride SA (K-DUR,KLOR-CON) 20 MEQ tablet Take 2 tablets (40 mEq total) by mouth 2 (two) times daily. Take two tablets twice a day for 1 week and then change to once a day 06/29/18   Enid Baas, MD  ursodiol (ACTIGALL) 300 MG capsule Take 1 capsule (300 mg total) by mouth daily. Patient not taking: Reported on 09/03/2018 06/28/18   Milagros Loll, MD    Family History Family History  Problem Relation Age of Onset  . Healthy Mother   . Diabetes Mother     Social History Social History   Tobacco Use  . Smoking status: Never Smoker  . Smokeless tobacco: Never Used  Substance Use Topics  . Alcohol use: No  . Drug use: Yes    Types: Marijuana     Allergies   Benadryl [diphenhydramine hcl]   Review of Systems Review of Systems  All other systems reviewed and are negative.    Physical Exam Updated Vital Signs BP 97/72   Pulse 76   Temp 97.7 F (36.5 C) (Oral)   Resp 16   LMP 10/25/2018   SpO2 99%   Physical Exam Vitals signs and nursing  note reviewed.  Constitutional:      General: She is in acute distress.     Appearance: She is well-developed. She is ill-appearing. She is not toxic-appearing or diaphoretic.     Comments: Uncomfortable, tearful  HENT:     Head: Normocephalic and atraumatic.     Right Ear: External ear normal.     Left Ear: External ear normal.     Nose: No congestion or rhinorrhea.     Mouth/Throat:     Mouth: Mucous membranes are moist.     Pharynx: No oropharyngeal exudate or posterior oropharyngeal erythema.  Eyes:     General: No scleral icterus.    Conjunctiva/sclera: Conjunctivae normal.     Pupils: Pupils are equal, round, and reactive to light.  Neck:     Musculoskeletal: Normal range of  motion and neck supple.     Trachea: Phonation normal.  Cardiovascular:     Rate and Rhythm: Normal rate and regular rhythm.     Heart sounds: Normal heart sounds.  Pulmonary:     Effort: Pulmonary effort is normal.     Breath sounds: Normal breath sounds.  Abdominal:     General: There is no distension.     Palpations: Abdomen is soft. There is no mass.     Tenderness: There is abdominal tenderness (Midepigastric, mild). There is no right CVA tenderness, guarding or rebound.     Hernia: No hernia is present.  Musculoskeletal: Normal range of motion.  Skin:    General: Skin is warm and dry.     Coloration: Skin is not jaundiced or pale.  Neurological:     Mental Status: She is alert and oriented to person, place, and time.     Cranial Nerves: No cranial nerve deficit.     Sensory: No sensory deficit.     Motor: No abnormal muscle tone.     Coordination: Coordination normal.  Psychiatric:        Mood and Affect: Mood normal.        Behavior: Behavior normal.        Thought Content: Thought content normal.        Judgment: Judgment normal.      ED Treatments / Results  Labs (all labs ordered are listed, but only abnormal results are displayed) Labs Reviewed  URINALYSIS, ROUTINE W REFLEX MICROSCOPIC - Abnormal; Notable for the following components:      Result Value   Leukocytes,Ua TRACE (*)    All other components within normal limits  COMPREHENSIVE METABOLIC PANEL - Abnormal; Notable for the following components:   Potassium <2.0 (*)    Glucose, Bld 108 (*)    Total Protein 8.9 (*)    Total Bilirubin 1.6 (*)    All other components within normal limits  CBC WITH DIFFERENTIAL/PLATELET - Abnormal; Notable for the following components:   RBC 5.26 (*)    All other components within normal limits  LIPASE, BLOOD  MAGNESIUM    EKG None  Radiology No results found.  Procedures .Critical Care Performed by: Mancel BaleWentz, Jaeceon Michelin, MD Authorized by: Mancel BaleWentz, Daija Routson, MD    Critical care provider statement:    Critical care time (minutes):  40   Critical care start time:  11/01/2018 3:02 PM   Critical care end time:  11/01/2018 4:42 PM   Critical care time was exclusive of:  Separately billable procedures and treating other patients   Critical care was necessary to treat or prevent imminent or life-threatening deterioration of the  following conditions:  Metabolic crisis   Critical care was time spent personally by me on the following activities:  Blood draw for specimens, development of treatment plan with patient or surrogate, discussions with consultants, evaluation of patient's response to treatment, examination of patient, obtaining history from patient or surrogate, ordering and performing treatments and interventions, ordering and review of laboratory studies, pulse oximetry, re-evaluation of patient's condition, review of old charts and ordering and review of radiographic studies   (including critical care time)  Medications Ordered in ED Medications  0.9 %  sodium chloride infusion ( Intravenous Rate/Dose Verify 11/01/18 1633)  potassium chloride 10 mEq in 100 mL IVPB (has no administration in time range)  magnesium sulfate IVPB 2 g 50 mL (has no administration in time range)  sodium chloride 0.9 % bolus 500 mL ( Intravenous Stopped 11/01/18 1631)  HYDROmorphone (DILAUDID) injection 1 mg (1 mg Intravenous Given 11/01/18 1529)  ondansetron (ZOFRAN) injection 4 mg (4 mg Intravenous Given 11/01/18 1529)  HYDROmorphone (DILAUDID) injection 1 mg (1 mg Intravenous Given 11/01/18 1608)  potassium chloride SA (K-DUR,KLOR-CON) CR tablet 40 mEq (40 mEq Oral Given 11/01/18 1640)     Initial Impression / Assessment and Plan / ED Course  I have reviewed the triage vital signs and the nursing notes.  Pertinent labs & imaging results that were available during my care of the patient were reviewed by me and considered in my medical decision making (see chart for details).   Clinical Course as of Oct 31 1640  Sun Nov 01, 2018  1637 Normal except potassium low, glucose, total protein high, total bilirubin high  Comprehensive metabolic panel(!!) [EW]  1637 Normal except trace leukocytes  Urinalysis, Routine w reflex microscopic(!) [EW]  1637 Normal  Lipase, blood [EW]  1637 Normal  CBC with Differential(!) [EW]    Clinical Course User Index [EW] Mancel Bale, MD        Patient Vitals for the past 24 hrs:  BP Temp Temp src Pulse Resp SpO2  11/01/18 1630 97/72 - - 76 16 99 %  11/01/18 1600 91/74 - - 63 16 100 %  11/01/18 1532 113/75 - - 72 16 100 %  11/01/18 1438 (!) 92/58 97.7 F (36.5 C) Oral 70 16 98 %    4:39 PM Reevaluation with update and discussion. After initial assessment and treatment, an updated evaluation reveals she is more comfortable now after second dose of narcotic analgesia.  Plan discussed with patient.  States that she has previously had very low potassium levels.  All questions were answered. Mancel Bale   Medical Decision Making: Nausea and vomiting with likely secondary hypokalemia.  Concern for hypomagnesemia, treated with magnesium along with multiple doses of potassium in the ED.  Pain controlled with narcotic analgesia.  Unclear cause of pain, possibly secondary to nausea and vomiting.  No apparent cardiac instability.  Very low blood pressure however patient is very low BMI, and likely representing her near normal status.  Patient will require hospitalization for intensive therapy.  CRITICAL CARE-yes Performed by: Mancel Bale  Nursing Notes Reviewed/ Care Coordinated Applicable Imaging Reviewed Interpretation of Laboratory Data incorporated into ED treatment   4:41 PM-Consult complete with hospitalist. Patient case explained and discussed.  He agrees to admit patient for further evaluation and treatment. Call ended at 5:01 PM  Plan: Admit  Final Clinical Impressions(s) / ED Diagnoses   Final diagnoses:   Hypokalemia  Nausea and vomiting, intractability of vomiting not specified, unspecified vomiting type  Epigastric pain    ED Discharge Orders    None       Mancel Bale, MD 11/01/18 [phone]

## 2018-11-01 NOTE — ED Notes (Addendum)
ED TO INPATIENT HANDOFF REPORT  ED Nurse Name and Phone #: Carren Blakley  S Name/Age/Gender Sara Hill 28 y.o. female Room/Bed: WA09/WA09  Code Status   Code Status: Prior  Home/SNF/Other Home Patient oriented to: self, place, time and situation Is this baseline? Yes   Triage Complete: Triage complete  Chief Complaint multiple symptoms  Triage Note Patient c/o central abdominal pain with diarrhea since yesterday. Reports taking bentyl with no relief.   Allergies Allergies  Allergen Reactions  . Benadryl [Diphenhydramine Hcl] Itching and Swelling    Pt reeports "feels like throat swelling".     Level of Care/Admitting Diagnosis ED Disposition    ED Disposition Condition Comment   Admit  Hospital Area: [facility] [[id number]]  Level of Care: Telemetry [5]  Admit to tele based on following criteria: Monitor QTC interval  Admit to tele based on following criteria: Complex arrhythmia (Bradycardia/Tachycardia)  Diagnosis: Hypokalemia [098119]  Admitting Physician: Rodolph Bong [[id number]]  Attending Physician: Rodolph Bong [[id number]]  Estimated length of stay: past midnight tomorrow  Certification:: I certify this patient will need inpatient services for at least 2 midnights  PT Class (Do Not Modify): Inpatient [101]  PT Acc Code (Do Not Modify): Private [1]       B Medical/Surgery History Past Medical History:  Diagnosis Date  . Gall stones   . Low blood potassium    Past Surgical History:  Procedure Laterality Date  . CHOLECYSTECTOMY    . COLONOSCOPY  01/2018   normal  . ESOPHAGOGASTRODUODENOSCOPY  01/2018   normal     A IV Location/Drains/Wounds Patient Lines/Drains/Airways Status   Active Line/Drains/Airways    Name:   Placement date:   Placement time:   Site:   Days:   Peripheral IV 11/01/18 Right Antecubital   11/01/18    1522    Antecubital   less than 1          Intake/Output Last 24 hours  Intake/Output Summary  (Last 24 hours) at 11/01/2018 1737 Last data filed at 11/01/2018 1633 Gross per 24 hour  Intake 504.49 ml  Output -  Net 504.49 ml    Labs/Imaging Results for orders placed or performed during the hospital encounter of 11/01/18 (from the past 48 hour(s))  Urinalysis, Routine w reflex microscopic     Status: Abnormal   Collection Time: 11/01/18  2:47 PM  Result Value Ref Range   Color, Urine YELLOW YELLOW   APPearance CLEAR CLEAR   Specific Gravity, Urine 1.006 1.005 - 1.030   pH 8.0 5.0 - 8.0   Glucose, UA NEGATIVE NEGATIVE mg/dL   Hgb urine dipstick NEGATIVE NEGATIVE   Bilirubin Urine NEGATIVE NEGATIVE   Ketones, ur NEGATIVE NEGATIVE mg/dL   Protein, ur NEGATIVE NEGATIVE mg/dL   Nitrite NEGATIVE NEGATIVE   Leukocytes,Ua TRACE (A) NEGATIVE   RBC / HPF 0-5 0 - 5 RBC/hpf   WBC, UA 0-5 0 - 5 WBC/hpf   Bacteria, UA NONE SEEN NONE SEEN   Squamous Epithelial / LPF 0-5 0 - 5   Mucus PRESENT    Hyaline Casts, UA PRESENT     Comment: Performed at [facility], [address], [place], [place] [address]  Comprehensive metabolic panel     Status: Abnormal   Collection Time: 11/01/18  3:22 PM  Result Value Ref Range   Sodium 137 135 - 145 mmol/L   Potassium <2.0 (LL) 3.5 - 5.1 mmol/L    Comment: REPEATED TO VERIFY CRITICAL  RESULT CALLED TO, READ BACK BY AND VERIFIED WITH: B JESSEE,RN 11/01/18 1626 RHOLMES    Chloride 103 98 - 111 mmol/L   CO2 24 22 - 32 mmol/L   Glucose, Bld 108 (H) 70 - 99 mg/dL   BUN 7 6 - 20 mg/dL   Creatinine, Ser 8.01 0.44 - 1.00 mg/dL   Calcium 9.5 8.9 - 65.5 mg/dL   Total Protein 8.9 (H) 6.5 - 8.1 g/dL   Albumin 4.9 3.5 - 5.0 g/dL   AST 25 15 - 41 U/L   ALT 21 0 - 44 U/L   Alkaline Phosphatase 84 38 - 126 U/L   Total Bilirubin 1.6 (H) 0.3 - 1.2 mg/dL   GFR calc non Af Amer >60 >60 mL/min   GFR calc Af Amer >60 >60 mL/min   Anion gap 10 5 - 15    Comment: Performed at [facility], [address] [place],  [place] [address]  CBC with Differential     Status: Abnormal   Collection Time: 11/01/18  3:22 PM  Result Value Ref Range   WBC 8.2 4.0 - 10.5 K/uL   RBC 5.26 (H) 3.87 - 5.11 MIL/uL   Hemoglobin 15.0 12.0 - 15.0 g/dL   HCT 70.7 86.7 - 54.4 %   MCV 82.1 80.0 - 100.0 fL   MCH 28.5 26.0 - 34.0 pg   MCHC 34.7 30.0 - 36.0 g/dL   RDW 92.0 10.0 - 71.2 %   Platelets 273 150 - 400 K/uL   nRBC 0.0 0.0 - 0.2 %   Neutrophils Relative % 68 %   Neutro Abs 5.6 1.7 - 7.7 K/uL   Lymphocytes Relative 23 %   Lymphs Abs 1.9 0.7 - 4.0 K/uL   Monocytes Relative 8 %   Monocytes Absolute 0.6 0.1 - 1.0 K/uL   Eosinophils Relative 0 %   Eosinophils Absolute 0.0 0.0 - 0.5 K/uL   Basophils Relative 1 %   Basophils Absolute 0.1 0.0 - 0.1 K/uL   Immature Granulocytes 0 %   Abs Immature Granulocytes 0.03 0.00 - 0.07 K/uL    Comment: Performed at [facility], [address], [place], [place] [address]  Lipase, blood     Status: None   Collection Time: 11/01/18  3:22 PM  Result Value Ref Range   Lipase 34 11 - 51 U/L    Comment: Performed at [facility], [address], [place], [place] [address]   No results found.  Pending Labs Unresulted Labs (From admission, onward)    Start     Ordered   11/01/18 1635  Magnesium  Add-on,   STAT     11/01/18 1634          Vitals/Pain Today's Vitals   11/01/18 1630 11/01/18 1640 11/01/18 1700 11/01/18 1730  BP: 97/72  104/67 102/71  Pulse: 76  (!) 58 (!) 57  Resp: 16  15 11   Temp:      TempSrc:      SpO2: 99%  99% 99%  PainSc:  5       Isolation Precautions No active isolations  Medications Medications  potassium chloride 10 mEq in 100 mL IVPB (10 mEq Intravenous New Bag/Given 11/01/18 1643)  magnesium sulfate IVPB 2 g 50 mL (2 g Intravenous New Bag/Given 11/01/18 1732)  0.9 % NaCl with KCl 40 mEq / L  infusion (125 mL/hr Intravenous New Bag/Given 11/01/18 1733)  sodium chloride 0.9 % bolus 1,000 mL (has  no  administration in time range)  sodium chloride 0.9 % bolus 500 mL ( Intravenous Stopped 11/01/18 1631)  HYDROmorphone (DILAUDID) injection 1 mg (1 mg Intravenous Given 11/01/18 1529)  ondansetron (ZOFRAN) injection 4 mg (4 mg Intravenous Given 11/01/18 1529)  HYDROmorphone (DILAUDID) injection 1 mg (1 mg Intravenous Given 11/01/18 1608)  potassium chloride SA (K-DUR,KLOR-CON) CR tablet 40 mEq (40 mEq Oral Given 11/01/18 1640)    Mobility walks Low fall risk   Focused Assessments     R Recommendations: See Admitting Provider Note  Report given to: Toniann Fail RN  Additional Notes:

## 2018-11-01 NOTE — Progress Notes (Signed)
Pt has arrived to Room 1516 from ED at this time. Pt alert and oriented.

## 2018-11-02 ENCOUNTER — Inpatient Hospital Stay (HOSPITAL_COMMUNITY): Payer: Self-pay

## 2018-11-02 LAB — COMPREHENSIVE METABOLIC PANEL
ALT: 20 U/L (ref 0–44)
AST: 23 U/L (ref 15–41)
Albumin: 3.3 g/dL — ABNORMAL LOW (ref 3.5–5.0)
Alkaline Phosphatase: 60 U/L (ref 38–126)
Anion gap: 6 (ref 5–15)
BUN: <5 mg/dL — ABNORMAL LOW (ref 6–20)
CO2: 17 mmol/L — AB (ref 22–32)
Calcium: 8 mg/dL — ABNORMAL LOW (ref 8.9–10.3)
Chloride: 117 mmol/L — ABNORMAL HIGH (ref 98–111)
Creatinine, Ser: <0.3 mg/dL — ABNORMAL LOW (ref 0.44–1.00)
GLUCOSE: 104 mg/dL — AB (ref 70–99)
Potassium: 3.8 mmol/L (ref 3.5–5.1)
Sodium: 140 mmol/L (ref 135–145)
Total Bilirubin: 1.2 mg/dL (ref 0.3–1.2)
Total Protein: 6.2 g/dL — ABNORMAL LOW (ref 6.5–8.1)

## 2018-11-02 LAB — LIPASE, BLOOD: Lipase: 26 U/L (ref 11–51)

## 2018-11-02 LAB — MAGNESIUM: Magnesium: 2 mg/dL (ref 1.7–2.4)

## 2018-11-02 LAB — CBC
HCT: 33.6 % — ABNORMAL LOW (ref 36.0–46.0)
Hemoglobin: 11.3 g/dL — ABNORMAL LOW (ref 12.0–15.0)
MCH: 28.3 pg (ref 26.0–34.0)
MCHC: 33.6 g/dL (ref 30.0–36.0)
MCV: 84 fL (ref 80.0–100.0)
Platelets: 217 10*3/uL (ref 150–400)
RBC: 4 MIL/uL (ref 3.87–5.11)
RDW: 15.7 % — ABNORMAL HIGH (ref 11.5–15.5)
WBC: 4.3 10*3/uL (ref 4.0–10.5)
nRBC: 0 % (ref 0.0–0.2)

## 2018-11-02 LAB — TSH: TSH: 0.918 u[IU]/mL (ref 0.350–4.500)

## 2018-11-02 MED ORDER — OXYCODONE HCL 5 MG PO TABS
5.0000 mg | ORAL_TABLET | ORAL | Status: DC | PRN
  Filled 2018-11-02: qty 1

## 2018-11-02 MED ORDER — IOHEXOL 300 MG/ML  SOLN
80.0000 mL | Freq: Once | INTRAMUSCULAR | Status: AC | PRN
  Administered 2018-11-02: 80 mL via INTRAVENOUS

## 2018-11-02 MED ORDER — ALUM & MAG HYDROXIDE-SIMETH 200-200-20 MG/5ML PO SUSP
30.0000 mL | Freq: Once | ORAL | Status: DC
  Filled 2018-11-02: qty 30

## 2018-11-02 MED ORDER — SODIUM CHLORIDE (PF) 0.9 % IJ SOLN
INTRAMUSCULAR | Status: AC
  Filled 2018-11-02: qty 50

## 2018-11-02 MED ORDER — DICYCLOMINE HCL 10 MG PO CAPS: 20.0000 mg | ORAL_CAPSULE | Freq: Three times a day (TID) | ORAL | Status: DC

## 2018-11-02 MED ORDER — CALCIUM CARBONATE ANTACID 500 MG PO CHEW
400.0000 mg | CHEWABLE_TABLET | Freq: Once | ORAL | Status: AC
  Administered 2018-11-02: 400 mg via ORAL
  Filled 2018-11-02: qty 2

## 2018-11-02 MED ORDER — HYDROMORPHONE HCL 1 MG/ML IJ SOLN
1.0000 mg | Freq: Once | INTRAMUSCULAR | Status: AC
  Administered 2018-11-02: 1 mg via INTRAVENOUS
  Filled 2018-11-02: qty 1

## 2018-11-02 NOTE — Progress Notes (Signed)
Left AMA at 1919. Patient stated she was not satisfied with care provided,not willing to discuss plan of care  On call/AC notified

## 2018-11-02 NOTE — Progress Notes (Signed)
Initial Nutrition Assessment  DOCUMENTATION CODES:   Severe malnutrition in context of chronic illness, Underweight  INTERVENTION:   Once diet advanced, Monitor magnesium, potassium, and phosphorus daily for at least 3 days, MD to replete as needed, as pt is at risk for refeeding syndrome given severe malnutrition, poor PO intakes.  -Once diet is advanced, provide Ensure Enlive po TID, each supplement provides 350 kcal and 20 grams of protein -Continue Multivitamin with minerals daily  If patient is unable to have diet advanced, would recommend consideration of nutrition support given severity of malnutrition.  NUTRITION DIAGNOSIS:   Severe Malnutrition related to chronic illness(diarrhea associated with status post cholecystectomy ) as evidenced by severe fat depletion, severe muscle depletion. GOAL:   Patient will meet greater than or equal to 90% of their needs  MONITOR:   Diet advancement, Labs, Weight trends, I & O's  REASON FOR ASSESSMENT:   Consult Assessment of nutrition requirement/status  ASSESSMENT:    28 y.o. female with medical history significant of hyponatremia, chronic diarrhea, status post cholecystectomy presented to the ED with a 2-day history of upper abdominal pain.    Patient in room with no family at bedside. Pt attempting to drink contrast in preparation for procedure. Pt currently NPO. States she hasn't eaten anything in 3 days. States prior to that she was mainly drinking Ensures her significant other bought for her. Pt was having N/V PTA with abdominal pain. Pt was unable to afford her Creon so she was not taking it PTA. MD to resume Creon once diet is advanced. RD will order Ensure supplements if diet is advanced.   Severe malnutrition continues. May need to consider nutrition support if unable to advance or tolerate diet.   Per weight records, pt weighed 79 lbs in November 2019. Pt has since gained weight (most likely attributed to taking Creon).    Labs reviewed. Medications: CREON capsule TID, Multivitamin with minerals daily  NUTRITION - FOCUSED PHYSICAL EXAM:    Most Recent Value  Orbital Region  Moderate depletion  Upper Arm Region  Severe depletion  Thoracic and Lumbar Region  Unable to assess  Buccal Region  Severe depletion  Temple Region  Severe depletion  Clavicle Bone Region  Severe depletion  Clavicle and Acromion Bone Region  Severe depletion  Scapular Bone Region  Severe depletion  Dorsal Hand  Severe depletion  Patellar Region  Severe depletion  Anterior Thigh Region  Severe depletion  Posterior Calf Region  Severe depletion  Edema (RD Assessment)  None       Diet Order:   Diet Order            Diet NPO time specified  Diet effective now              EDUCATION NEEDS:   Education needs have been addressed  Skin:  Skin Assessment: Reviewed RN Assessment  Last BM:  3/22  Height:   Ht Readings from Last 1 Encounters:  11/01/18 5' (1.524 m)    Weight:   Wt Readings from Last 1 Encounters:  11/02/18 39.1 kg    Ideal Body Weight:  45.5 kg  BMI:  Body mass index is 16.85 kg/m.  Estimated Nutritional Needs:   Kcal:  1400-1600  Protein:  65-75g  Fluid:  1.6L/day   Tilda Franco, MS, RD, LDN [facility] Inpatient Clinical Dietitian Pager: [phone] After Hours Pager: [phone]

## 2018-11-02 NOTE — Progress Notes (Signed)
OT Cancellation Note  Patient Details Name: Sara Hill MRN: [id number] DOB: July 11, 1991   Cancelled Treatment:    Reason Eval/Treat Not Completed: OT screened, no needs identified, will sign off.  Pt is independent.   Jeani Hawking, OTR/L Acute Rehabilitation Services Pager [phone] Office [phone]   Jeani Hawking M 11/02/2018, 4:30 PM

## 2018-11-02 NOTE — Evaluation (Signed)
Physical Therapy Evaluation Patient Details Name: Sara Hill MRN: [id number] DOB: 04-12-1991 Today's Date: 11/02/2018   History of Present Illness   28 y.o. female with medical history significant of hyponatremia, chronic diarrhea, status post cholecystectomy presented to the ED with a 2-day history of upper abdominal pain. Dx of severe hypokalemia, dehydration, diarrhea.   Clinical Impression  Pt is independent with mobility, no further PT indicated. She ambulated 320' without an assistive device, no loss of balance, no dyspnea, HR 90s. From PT standpoint, she is ready to DC home. Encouraged pt to ambulate in halls to minimize deconditioning while in hospital. PT signing off.     Follow Up Recommendations No PT follow up    Equipment Recommendations  None recommended by PT    Recommendations for Other Services       Precautions / Restrictions Precautions Precautions: None Precaution Comments: denies h/o falls in past 1 year Restrictions Weight Bearing Restrictions: No      Mobility  Bed Mobility Overal bed mobility: Independent                Transfers Overall transfer level: Independent                  Ambulation/Gait Ambulation/Gait assistance: Independent Gait Distance (Feet): 320 Feet Assistive device: None Gait Pattern/deviations: WFL(Within Functional Limits) Gait velocity: WNL   General Gait Details: steady, no loss of balance, HR 90s walking, no dyspnea  Stairs            Wheelchair Mobility    Modified Rankin (Stroke Patients Only)       Balance Overall balance assessment: No apparent balance deficits (not formally assessed)                                           Pertinent Vitals/Pain Pain Assessment: 0-10 Pain Score: 3  Pain Location: abdomen Pain Descriptors / Indicators: Cramping Pain Intervention(s): Limited activity within patient's tolerance;Monitored during session;Premedicated before  session    Home Living Family/patient expects to be discharged to:: Private residence Living Arrangements: Spouse/significant other Available Help at Discharge: Family;Available 24 hours/day   Home Access: Stairs to enter   Entrance Stairs-Number of Steps: 1 Home Layout: One level Home Equipment: None      Prior Function Level of Independence: Independent         Comments: works at [employer]        Extremity/Trunk Assessment   Upper Extremity Assessment Upper Extremity Assessment: Overall WFL for tasks assessed    Lower Extremity Assessment Lower Extremity Assessment: Overall WFL for tasks assessed    Cervical / Trunk Assessment Cervical / Trunk Assessment: Normal  Communication   Communication: No difficulties  Cognition Arousal/Alertness: Awake/alert Behavior During Therapy: WFL for tasks assessed/performed Overall Cognitive Status: Within Functional Limits for tasks assessed                                        General Comments      Exercises     Assessment/Plan    PT Assessment Patent does not need any further PT services  PT Problem List         PT Treatment Interventions      PT Goals (Current goals can be found in  the Care Plan section)  Acute Rehab PT Goals Patient Stated Goal: return to work PT Goal Formulation: All assessment and education complete, DC therapy    Frequency     Barriers to discharge        Co-evaluation               AM-PAC PT "6 Clicks" Mobility  Outcome Measure Help needed turning from your back to your side while in a flat bed without using bedrails?: None Help needed moving from lying on your back to sitting on the side of a flat bed without using bedrails?: None Help needed moving to and from a bed to a chair (including a wheelchair)?: None Help needed standing up from a chair using your arms (e.g., wheelchair or bedside chair)?: None Help needed to walk in  hospital room?: None Help needed climbing 3-5 steps with a railing? : None 6 Click Score: 24    End of Session   Activity Tolerance: Patient tolerated treatment well Patient left: in chair;with call bell/phone within reach Nurse Communication: Mobility status      Time: 1000-1015 PT Time Calculation (min) (ACUTE ONLY): 15 min   Charges:   PT Evaluation $PT Eval Low Complexity: 1 Low          Tamala Ser PT 11/02/2018  Acute Rehabilitation Services Pager [phone] Office [phone]

## 2018-11-02 NOTE — Progress Notes (Addendum)
Pt is rating upper abd pain 10/10. IV toradol did help at first for a short period of time (went from an initial 8 to about a 5) but pain progressively came back and up. Pt is unable to hold down PO meds d/t N&V. Paged on-call for pain PRN.

## 2018-11-02 NOTE — Progress Notes (Addendum)
PROGRESS NOTE  Margrette Zarzecki VZC:[id number] DOB: 07-29-1991 DOA: 11/01/2018 PCP: Bing Neighbors, FNP  Brief History   Sara Hill is a 28 y.o. female with medical history significant of hyponatremia, chronic diarrhea, status post cholecystectomy presented to the ED with a 2-day history of upper abdominal pain nonradiating which is like a hunger pain per patient causing her to sometimes be doubled over.  Patient also endorses a 2-day history of worsening diarrhea and a 5 to 6-day history of worsening generalized weakness.  Patient does endorse nausea 2 episodes of emesis however none since in the ED.  Patient denies anydatient states is on oral potassium supplementation at 20 mEq 3 times daily and has been compliant with this regimen.  Patient states abdominal pain and diarrhea started after her cholecystectomy in 2014.  Patient states had been on Creon before in the past with significant improvement with his symptoms however due to financial issues has not been able to afford the Creon lately.  Patient denies any history of eating disorders.  Consultants  . None  Procedures  . None  Antibiotics  . None  Interval History/Subjective  The patient was admitted to a telemetry bed and her potassium was supplemented. She has been restarted on her Creon, and social work has been consulted to assist her with obtaining this medication. Nutrition has been consulted to assist with her malnutrition. PT/OT will evaluate and treat the patient for malnutrition. She is receiving IV fluids.  Objective   Vitals:  Vitals:   11/01/18 2116 11/02/18 0514  BP: 97/61 100/63  Pulse: (!) 59 70  Resp: 14 15  Temp: (!) 97.3 F (36.3 C) 98 F (36.7 C)  SpO2: 100% 100%    Exam:  Constitutional:  . Appears calm and comfortable Eyes:  . pupils and irises appear normal . Normal lids and conjunctivae ENMT:  . grossly normal hearing  . Lips appear normal . external ears, nose appear normal  . Oropharynx: mucosa, tongue,posterior pharynx appear normal Neck:  . neck appears normal, no masses, normal ROM, supple . no thyromegaly Respiratory:  . NO wheezes, rales, or rhonchi. . No increased work of breathing. Cardiovascular:  . Regular rate and rhythm. . No murmurs, ectopy, or gallups. . No lateral PMI. No thrills. Abdomen:  . Abdomen soft, non-tender, non-distended . No hernias, masses, or organomegaly is appreciated. . Normoactive bowel sounds. Musculoskeletal:  . No cyanosis, clubbing or edema Skin:  . No rashes, lesions, ulcers . palpation of skin: no induration or nodules Neurologic:  . CN 2-12 intact . Sensation all 4 extremities intact Psychiatric:  . Mental status o Mood, affect appropriate o Orientation to person, place, time  . judgment and insight appear intact    I have personally reviewed the following:   Today's Data  . CBC, CMP, UA,   Imaging  . CT abdomen and pelvis  Scheduled Meds: . dicyclomine  10 mg Oral TID AC  . enoxaparin (LOVENOX) injection  30 mg Subcutaneous Q24H  . feeding supplement (ENSURE ENLIVE)  237 mL Oral BID BM  . lipase/protease/amylase  24,000 Units Oral TID AC  . multivitamin with minerals  1 tablet Oral Daily  . sodium chloride (PF)      . sodium chloride flush  3 mL Intravenous Q12H  . ursodiol  300 mg Oral Daily   Continuous Infusions: . 0.9 % NaCl with KCl 40 mEq / L 125 mL/hr (11/02/18 1003)    Principal Problem:   Hypokalemia Active Problems:  Dehydration   Diarrhea   Abdominal pain   Generalized weakness   Nausea and vomiting   Severe protein-calorie malnutrition (HCC)   A & P   Problem  Dehydration  Diarrhea  Abdominal Pain  Generalized Weakness  Nausea and Vomiting  Severe Protein-Calorie Malnutrition (Hcc)  Protein-calorie malnutrition, severe  Hypokalemia   Hypokalemia: Supplemented. Monitor  Abdominal pain: CT abdomen demonstrated small multiple bilateral non-obstructing renal  calculi.  Dehydration: Improved.  Nausea and vomiting: Improved.  Severe Protein Calorie Malnutrition: Dietician has been consulted.  Generalized weakness: PT/OT eval and treat.  DVT prophylaxis: Lovenox Code Status: Full Code Family Communication: None available Disposition Plan: Home   Jakala Herford, DO [facility] Direct contact: see [email or web address]  7PM-7AM contact night coverage as above 11/02/2018, 12:53 PM  LOS: 1 day     LOS: 1 day   ADDENDUM: The patient continues to complain of right upper quadrant pain. The patient states that it feels like when she had her gallstones removed. She has had no further nausea/vomiting/ordiarrhea. Regular diet has been restarted following her CT abdomen and pelvis which was unremarkable except for some non-obstructive stones. Will check a right upper quadrant ultrasound. Consider GI consult in the morning if significant findings.

## 2018-11-03 NOTE — Discharge Summary (Signed)
Physician Discharge Summary  Sara Hill PFY:[id number] DOB: 30-Jun-1991 DOA: 11/01/2018  PCP: Bing Neighbors, FNP  Admit date: 11/01/2018 Discharge date: 11/03/2018  Recommendations for Outpatient Follow-up:  1. Patient left AMA because she was angry that she could not eat because of ultrasound being ordered.   Discharge Diagnoses: Principal diagnosis is #1 #1 Abdominal pain - Right upper quadrant Dehydration  Diarrhea  Abdominal Pain  Generalized Weakness  Nausea and Vomiting  Severe Protein-Calorie Malnutrition (Hcc)  Protein-calorie malnutrition, severe  Hypokalemia   Discharge Condition: fair Disposition: Patient left AMA  Diet recommendation: Patient left AMA  Filed Weights   11/01/18 1826 11/02/18 0514  Weight: 39.7 kg 39.1 kg    History of present illness:  Sara Hill is a 28 y.o. female with medical history significant of hyponatremia, chronic diarrhea, status post cholecystectomy presented to the ED with a 2-day history of upper abdominal pain nonradiating which is like a hunger pain per patient causing her to sometimes be doubled over.  Patient also endorses a 2-day history of worsening diarrhea and a 5 to 6-day history of worsening generalized weakness.  Patient does endorse nausea 2 episodes of emesis however none since in the ED.  Patient denies any fever, no chills, no shortness of breath, no chest pain, no cough, no melena, no hematemesis, no hematochezia, no constipation, no asymmetric weakness or numbness.  No syncopal episodes.  Patient does endorse decreased oral intake and decreased appetite.  Patient states is on oral potassium supplementation at 20 mEq 3 times daily and has been compliant with this regimen.  Patient states abdominal pain and diarrhea started after her cholecystectomy in 2014.  Patient states had been on Creon before in the past with significant improvement with his symptoms however due to financial issues has not been able  to afford the Creon lately.  Patient denies any history of eating disorders.  Hospital Course:  The patient was admitted to a telemetry bed and her potassium was supplemented. She has been restarted on her Creon, and social work has been consulted to assist her with obtaining this medication. Nutrition has been consulted to assist with her malnutrition. Nutrition will evaluate and treat the patient for malnutrition. She is receiving IV fluids.  CT abdomen and pelvis was performed on 11/02/2018 and demonstrating only bilateral non-obstructing renal calculi. I demonstrated no definite acute intra-abdominal or intrapelvic abnormalities.   Ultrasound of the right upper quadrant was ordered due to the location of the patient's pain, her statement that she related the pain to a prior pain she had with "stones". She is post cholecystectomy. Apparently the patient became angry that she had to be NPO for this procedure and left AMA.  Today's assessment: S: Please see progress note dated 11/02/2018 for physical exam on the date that she left AMA. O: Vitals:  Vitals:   11/02/18 0514 11/02/18 1300  BP: 100/63 105/79  Pulse: 70 78  Resp: 15 14  Temp: 98 F (36.7 C) 98.1 F (36.7 C)  SpO2: 100% 100%     Discharge Instructions  Patient left AMA  Allergies  Allergen Reactions   Benadryl [Diphenhydramine Hcl] Itching and Swelling    Pt reeports "feels like throat swelling".     The results of significant diagnostics from this hospitalization (including imaging, microbiology, ancillary and laboratory) are listed below for reference.    Significant Diagnostic Studies: Ct Abdomen Pelvis W Contrast  Result Date: 11/02/2018 CLINICAL DATA:  Abdominal and epigastric pain for 2 days, nausea, vomiting  EXAM: CT ABDOMEN AND PELVIS WITH CONTRAST TECHNIQUE: Multidetector CT imaging of the abdomen and pelvis was performed using the standard protocol following bolus administration of intravenous contrast.  Sagittal and coronal MPR images reconstructed from axial data set. CONTRAST:  79mL OMNIPAQUE IOHEXOL 300 MG/ML SOLN IV. Dilute oral contrast. COMPARISON:  06/27/2018 FINDINGS: Lower chest: Lung bases clear Hepatobiliary: Patchy enhancement of the liver likely related to timing versus phase of enhancement, with unopacified hepatic veins noted. No focal mass lesion of the liver identified. Post cholecystectomy. No biliary dilatation. Pancreas: Normal appearance Spleen: Normal appearance. Small splenule inferior to spleen posteriorly. Adrenals/Urinary Tract: Adrenal glands normal appearance. Small BILATERAL nonobstructing renal calculi, multiple. Small LEFT renal cyst at mid inferior pole. No additional renal mass, hydronephrosis or hydroureter. Bladder unremarkable. Stomach/Bowel: Normal appendix. Stomach and bowel loops normal appearance Vascular/Lymphatic: Aorta normal caliber. No adenopathy. Retroaortic LEFT renal vein. Reproductive: Unremarkable uterus and adnexa Other: No free air. Minimal free pelvic fluid in cul-de-sac. No hernia. Musculoskeletal: Unremarkable IMPRESSION: BILATERAL nonobstructing renal calculi. No definite acute intra-abdominal or intrapelvic abnormalities. Electronically Signed   By: Ulyses Southward M.D.   On: 11/02/2018 13:13    Microbiology: No results found for this or any previous visit (from the past 240 hour(s)).   Labs: Basic Metabolic Panel: Recent Labs  Lab 11/01/18 1522 11/02/18 0518  NA 137 140  K <2.0* 3.8  CL 103 117*  CO2 24 17*  GLUCOSE 108* 104*  BUN 7 <5*  CREATININE 0.46 <0.30*  CALCIUM 9.5 8.0*  MG 2.2 2.0   Liver Function Tests: Recent Labs  Lab 11/01/18 1522 11/02/18 0518  AST 25 23  ALT 21 20  ALKPHOS 84 60  BILITOT 1.6* 1.2  PROT 8.9* 6.2*  ALBUMIN 4.9 3.3*   Recent Labs  Lab 11/01/18 1522 11/02/18 1720  LIPASE 34 26   No results for input(s): AMMONIA in the last 168 hours. CBC: Recent Labs  Lab 11/01/18 1522 11/02/18 0518  WBC  8.2 4.3  NEUTROABS 5.6  --   HGB 15.0 11.3*  HCT 43.2 33.6*  MCV 82.1 84.0  PLT 273 217   Cardiac Enzymes: No results for input(s): CKTOTAL, CKMB, CKMBINDEX, TROPONINI in the last 168 hours. BNP: BNP (last 3 results) No results for input(s): BNP in the last 8760 hours.  ProBNP (last 3 results) No results for input(s): PROBNP in the last 8760 hours.  CBG: No results for input(s): GLUCAP in the last 168 hours.  Active Problems:   Hypokalemia   Dehydration   Diarrhea   Abdominal pain   Generalized weakness   Nausea and vomiting   Severe protein-calorie malnutrition (HCC)   Time coordinating discharge: 38 minutes.  Signed:        Tyasia Packard, DO [facility]  11/03/2018, 5:21 PM

## 2018-11-24 ENCOUNTER — Other Ambulatory Visit: Payer: Self-pay

## 2018-11-24 ENCOUNTER — Encounter (HOSPITAL_COMMUNITY): Payer: Self-pay | Admitting: Emergency Medicine

## 2018-11-24 ENCOUNTER — Emergency Department (HOSPITAL_COMMUNITY)
Admission: EM | Admit: 2018-11-24 | Discharge: 2018-11-24 | Discharge status: Against Medical Advice | Payer: Self-pay | Attending: Emergency Medicine | Admitting: Emergency Medicine

## 2018-11-24 DIAGNOSIS — Z5321 Procedure and treatment not carried out due to patient leaving prior to being seen by health care provider: Secondary | ICD-10-CM | POA: Insufficient documentation

## 2018-11-24 NOTE — ED Triage Notes (Signed)
Pt in with c/o blurred vision and itchy eyes x 1 wk. Denies hx of allergies, states there are tiny bumps breaking out around eyes, as well as teary drainage. Denies any cough, fever or HA

## 2018-11-24 NOTE — ED Notes (Signed)
Pt called for x5 no reply. Pt not seen in lobby at this time.

## 2018-12-22 ENCOUNTER — Telehealth (INDEPENDENT_AMBULATORY_CARE_PROVIDER_SITE_OTHER): Payer: Self-pay | Admitting: Family Medicine

## 2018-12-22 DIAGNOSIS — E876 Hypokalemia: Secondary | ICD-10-CM

## 2018-12-22 DIAGNOSIS — K909 Intestinal malabsorption, unspecified: Secondary | ICD-10-CM

## 2018-12-22 DIAGNOSIS — E43 Unspecified severe protein-calorie malnutrition: Secondary | ICD-10-CM

## 2018-12-22 MED ORDER — CHOLESTYRAMINE 4 G PO PACK: 4.0000 g | PACK | Freq: Three times a day (TID) | ORAL | 12 refills | Status: DC

## 2018-12-22 MED ORDER — DICYCLOMINE HCL 20 MG PO TABS: 20.0000 mg | ORAL_TABLET | Freq: Three times a day (TID) | ORAL | 2 refills | Status: DC

## 2018-12-22 MED ORDER — ONDANSETRON HCL 4 MG PO TABS: 4.0000 mg | ORAL_TABLET | Freq: Three times a day (TID) | ORAL | 0 refills | Status: DC | PRN

## 2018-12-22 NOTE — Progress Notes (Signed)
Virtual Visit via Telephone Note  I connected with Malachi Pro on 12/22/18 at 11:10 AM EDT by telephone and verified that I am speaking with the correct person using two identifiers.  Location: Patient: Located at home during today's encounter  Provider: Located at primary care office     I discussed the limitations, risks, security and privacy concerns of performing an evaluation and management service by telephone and the availability of in person appointments. I also discussed with the patient that there may be a patient responsible charge related to this service. The patient expressed understanding and agreed to proceed.   History of Present Illness: Ltanya Parrie has been lost to follow-up since November and has no showed prior follow-up appointments.He is to have GI symptoms.  Concerned that potassium is low.  When previously seen patient was advised to obtain financial assistance which she has never done.  She never was able to obtain the Creon as she never provided required documents for medication assistance.  She reports that she feels that he potassium I slow. She endorses nausea, some vomiting, abdominal pain, diarrhea and muscle aches.  She is afebrile.  Has had no follow-up with GI since her last visit in November.  Continues to endorse poor weight gain and ability to tolerate food.    Assessment and Plan: 1. Chronically low serum potassium -Increase morning dose potassium 40 mg and take potassium   mg with dinner  -Return to the office 1 week potassium level check. 2. Protein-calorie malnutrition, severe - Patient previously prescribed Creon for management of malabsorption symptoms. Reports the medication was never picked up "it was a hassle to get". Notes per EMR indicate, patient refused to turn-in required financial documents to continue financial assistance.  3. Intestinal malabsorption, unspecified type See #3 -Prior health symptoms prescribed patient  cholestyramine, will continue. -Continue dicyclomine which was previously prescribed for abdominal pain 20 mg 3 times daily with meals. Explained to patient the necessity of completing financial assistance and following -up with gastroenterology.  Follow Up Instructions: RTC: 1 week potassium check    I discussed the assessment and treatment plan with the patient. The patient was provided an opportunity to ask questions and all were answered. The patient agreed with the plan and demonstrated an understanding of the instructions.   The patient was advised to call back or seek an in-person evaluation if the symptoms worsen or if the condition fails to improve as anticipated.  I provided 20 minutes of non-face-to-face time during this encounter.   Joaquin Courts, FNP

## 2018-12-22 NOTE — Progress Notes (Deleted)
Patient ID: Sara Hill, female    DOB: 1990/09/13, 28 y.o.   MRN: [id number]  PCP: Bing NeighborsHarris, Airam Heidecker S, FNP  No chief complaint on file.   Subjective:  HPI  Sara Hill is a 28 y.o. female presents for evaluation    Having diarrhea, nausea, D  Social History   Socioeconomic History  . Marital status: Single    Spouse name: Not on file  . Number of children: Not on file  . Years of education: Not on file  . Highest education level: Not on file  Occupational History  . Not on file  Social Needs  . Financial resource strain: Not on file  . Food insecurity:    Worry: Not on file    Inability: Not on file  . Transportation needs:    Medical: Not on file    Non-medical: Not on file  Tobacco Use  . Smoking status: Never Smoker  . Smokeless tobacco: Never Used  Substance and Sexual Activity  . Alcohol use: No  . Drug use: Yes    Types: Marijuana  . Sexual activity: Never  Lifestyle  . Physical activity:    Days per week: Not on file    Minutes per session: Not on file  . Stress: Not on file  Relationships  . Social connections:    Talks on phone: Not on file    Gets together: Not on file    Attends religious service: Not on file    Active member of club or organization: Not on file    Attends meetings of clubs or organizations: Not on file    Relationship status: Not on file  . Intimate partner violence:    Fear of current or ex partner: Not on file    Emotionally abused: Not on file    Physically abused: Not on file    Forced sexual activity: Not on file  Other Topics Concern  . Not on file  Social History Narrative   Lives at home with a friend.  Independent at baseline    Family History  Problem Relation Age of Onset  . Healthy Mother   . Diabetes Mother      Review of Systems  Patient Active Problem List   Diagnosis Date Noted  . Dehydration 11/01/2018  . Diarrhea 11/01/2018  . Abdominal pain 11/01/2018  . Generalized  weakness 11/01/2018  . Nausea and vomiting   . Severe protein-calorie malnutrition (HCC)   . Protein-calorie malnutrition, severe 06/29/2018  . Hypokalemia 06/27/2018    Allergies  Allergen Reactions  . Benadryl [Diphenhydramine Hcl] Itching and Swelling    Pt reeports "feels like throat swelling".     Prior to Admission medications   Medication Sig Start Date End Date Taking? Authorizing Provider  acetaminophen (TYLENOL) 500 MG tablet Take 1,000 mg by mouth every 6 (six) hours as needed for mild pain or moderate pain.    [provider]  dicyclomine (BENTYL) 10 MG capsule Take 1 capsule (10 mg total) by mouth 3 (three) times daily before meals. 06/28/18   Milagros LollSudini, Srikar, MD  ibuprofen (ADVIL,MOTRIN) 200 MG tablet Take 400 mg by mouth every 6 (six) hours as needed.    [provider]  oseltamivir (TAMIFLU) 75 MG capsule Take 1 capsule (75 mg total) by mouth every 12 (twelve) hours. 08/29/18   Lorre NickAllen, Anthony, MD  Pancrelipase, Lip-Prot-Amyl, 24000-76000 units CPEP Take 1 capsule (24,000 Units total) by mouth 3 (three) times daily with meals. 07/02/18   Tiburcio PeaHarris,  Godfrey Pick, FNP  potassium chloride (K-DUR) 10 MEQ tablet Take 1 tablet (10 mEq total) by mouth 3 (three) times daily. 08/29/18   Lorre Nick, MD  potassium chloride SA (K-DUR,KLOR-CON) 20 MEQ tablet Take 2 tablets (40 mEq total) by mouth 2 (two) times daily. Take two tablets twice a day for 1 week and then change to once a day Patient taking differently: Take 20 mEq by mouth 3 (three) times daily.  06/29/18   Enid Baas, MD  senna (SENOKOT) 8.6 MG tablet Take 1 tablet by mouth daily.    [provider]  ursodiol (ACTIGALL) 300 MG capsule Take 1 capsule (300 mg total) by mouth daily. 06/28/18   Milagros Loll, MD    Past Medical, Surgical Family and Social History reviewed and updated.    Objective:  There were no vitals filed for this visit.  Wt Readings from Last 3 Encounters:  11/24/18 86  lb 3.2 oz (39.1 kg)  11/02/18 86 lb 4.8 oz (39.1 kg)  09/03/18 78 lb (35.4 kg)     Physical Exam General appearance: alert, well developed, well nourished, cooperative and in no distress Head: Normocephalic, without obvious abnormality, atraumatic Respiratory: Respirations even and unlabored, normal respiratory rate Heart: rate and rhythm normal. No gallop or murmurs noted on exam  Extremities: No gross deformities Skin: Skin color, texture, turgor normal. No rashes seen  Psych: Appropriate mood and affect. Neurologic: Mental status: Alert, oriented to person, place, and time, thought content appropriate.  No results found for: POCGLU  No results found for: HGBA1C          Assessment & Plan:  There are no diagnoses linked to this encounter.     -The patient was given clear instructions to go to ER or return to medical center if symptoms do not improve, worsen or new problems develop. The patient verbalized understanding.    Joaquin Courts, FNP Primary Care at [facility] [address], [place] [address] [phone]: [phone]

## 2018-12-24 ENCOUNTER — Emergency Department (HOSPITAL_COMMUNITY)
Admission: EM | Admit: 2018-12-24 | Discharge: 2018-12-24 | Disposition: A | Discharge status: Home / Self Care | Payer: Self-pay | Attending: Emergency Medicine | Admitting: Emergency Medicine

## 2018-12-24 ENCOUNTER — Other Ambulatory Visit: Payer: Self-pay

## 2018-12-24 ENCOUNTER — Encounter (HOSPITAL_COMMUNITY): Payer: Self-pay | Admitting: Emergency Medicine

## 2018-12-24 DIAGNOSIS — E876 Hypokalemia: Secondary | ICD-10-CM | POA: Insufficient documentation

## 2018-12-24 DIAGNOSIS — Z79899 Other long term (current) drug therapy: Secondary | ICD-10-CM | POA: Insufficient documentation

## 2018-12-24 DIAGNOSIS — R5383 Other fatigue: Secondary | ICD-10-CM | POA: Insufficient documentation

## 2018-12-24 LAB — CBC WITH DIFFERENTIAL/PLATELET
Abs Immature Granulocytes: 0.01 10*3/uL (ref 0.00–0.07)
Basophils Absolute: 0.1 10*3/uL (ref 0.0–0.1)
Basophils Relative: 1 %
Eosinophils Absolute: 0 10*3/uL (ref 0.0–0.5)
Eosinophils Relative: 1 %
HCT: 36.8 % (ref 36.0–46.0)
Hemoglobin: 12.8 g/dL (ref 12.0–15.0)
Immature Granulocytes: 0 %
Lymphocytes Relative: 36 %
Lymphs Abs: 2.3 10*3/uL (ref 0.7–4.0)
MCH: 28.3 pg (ref 26.0–34.0)
MCHC: 34.8 g/dL (ref 30.0–36.0)
MCV: 81.4 fL (ref 80.0–100.0)
Monocytes Absolute: 0.5 10*3/uL (ref 0.1–1.0)
Monocytes Relative: 7 %
Neutro Abs: 3.5 10*3/uL (ref 1.7–7.7)
Neutrophils Relative %: 55 %
Platelets: 252 10*3/uL (ref 150–400)
RBC: 4.52 MIL/uL (ref 3.87–5.11)
RDW: 15.2 % (ref 11.5–15.5)
WBC: 6.4 10*3/uL (ref 4.0–10.5)
nRBC: 0 % (ref 0.0–0.2)

## 2018-12-24 LAB — BASIC METABOLIC PANEL
Anion gap: 12 (ref 5–15)
BUN: 6 mg/dL (ref 6–20)
CO2: 22 mmol/L (ref 22–32)
Calcium: 9.2 mg/dL (ref 8.9–10.3)
Chloride: 104 mmol/L (ref 98–111)
Creatinine, Ser: 0.44 mg/dL (ref 0.44–1.00)
GFR calc Af Amer: >60 mL/min (ref >60)
GFR calc non Af Amer: >60 mL/min (ref >60)
Glucose, Bld: 104 mg/dL — ABNORMAL HIGH (ref 70–99)
Potassium: 2.4 mmol/L — CL (ref 3.5–5.1)
Sodium: 138 mmol/L (ref 135–145)

## 2018-12-24 LAB — I-STAT BETA HCG BLOOD, ED (MC, WL, AP ONLY): I-stat hCG, quantitative: <5 m[IU]/mL (ref <5)

## 2018-12-24 LAB — MAGNESIUM: Magnesium: 2 mg/dL (ref 1.7–2.4)

## 2018-12-24 MED ORDER — POTASSIUM CHLORIDE CRYS ER 20 MEQ PO TBCR: 20.0000 meq | EXTENDED_RELEASE_TABLET | Freq: Every day | ORAL | 0 refills | Status: DC

## 2018-12-24 MED ORDER — POTASSIUM CHLORIDE CRYS ER 20 MEQ PO TBCR
80.0000 meq | EXTENDED_RELEASE_TABLET | Freq: Once | ORAL | Status: AC
  Administered 2018-12-24: 80 meq via ORAL
  Filled 2018-12-24: qty 4

## 2018-12-24 MED ORDER — POTASSIUM CHLORIDE 10 MEQ/100ML IV SOLN
10.0000 meq | INTRAVENOUS | Status: AC
  Administered 2018-12-24 (×2): 10 meq via INTRAVENOUS
  Filled 2018-12-24 (×2): qty 100

## 2018-12-24 NOTE — ED Provider Notes (Signed)
[facility] EMERGENCY DEPARTMENT Provider Note   CSN: [id number] Arrival date & time: 12/24/18  0121    History   Chief Complaint Chief Complaint  Patient presents with  . Hypokalemia    HPI Sara Hill is a 28 y.o. female.     The history is provided by the patient.  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 She takes 60 meQ at day.  .    Past Medical History:  Diagnosis Date  . Gall stones   . Low blood potassium     Patient Active Problem List   Diagnosis Date Noted  . Dehydration 11/01/2018  . Diarrhea 11/01/2018  . Abdominal pain 11/01/2018  . Generalized weakness 11/01/2018  . Nausea and vomiting   . Severe protein-calorie malnutrition (HCC)   . Protein-calorie malnutrition, severe 06/29/2018  . Hypokalemia 06/27/2018    Past Surgical History:  Procedure Laterality Date  . CHOLECYSTECTOMY    . COLONOSCOPY  01/2018   normal  . ESOPHAGOGASTRODUODENOSCOPY  01/2018   normal     OB History   No obstetric history on file.      Home Medications    Prior to Admission medications   Medication Sig Start Date End Date Taking? Authorizing Provider  acetaminophen (TYLENOL) 500 MG tablet Take 1,000 mg by mouth  every 6 (six) hours as needed for mild pain or moderate pain.    [provider]  cholestyramine (QUESTRAN) 4 g packet Take 1 packet (4 g total) by mouth 3 (three) times daily. 12/22/18   Bing Neighbors, FNP  dicyclomine (BENTYL) 20 MG tablet Take 1 tablet (20 mg total) by mouth 3 (three) times daily before meals. 12/22/18   Bing Neighbors, FNP  ibuprofen (ADVIL,MOTRIN) 200 MG tablet Take 400 mg by mouth every 6 (six) hours as needed.    [provider]  ondansetron (ZOFRAN) 4 MG tablet Take 1 tablet (4 mg total) by mouth every 8 (eight) hours as needed for nausea or vomiting. 12/22/18   Bing Neighbors, FNP  oseltamivir (TAMIFLU) 75 MG capsule Take 1 capsule (75 mg total) by mouth every 12 (twelve) hours. 08/29/18   Lorre Nick, MD  Pancrelipase, Lip-Prot-Amyl, 24000-76000 units CPEP Take 1 capsule (24,000 Units total) by mouth 3 (three) times daily with meals. 07/02/18   Bing Neighbors, FNP  potassium chloride (K-DUR) 10 MEQ tablet Take 1 tablet (10 mEq total) by mouth 3 (three) times daily. 08/29/18   Lorre Nick, MD  potassium chloride SA (K-DUR,KLOR-CON) 20 MEQ tablet Take 2 tablets (40 mEq total) by mouth 2 (two) times daily. Take two tablets twice a day for 1 week and then change to once a day Patient taking differently: Take 20 mEq by mouth  3 (three) times daily.  06/29/18   Enid Baas, MD  senna (SENOKOT) 8.6 MG tablet Take 1 tablet by mouth daily.    [provider]  ursodiol (ACTIGALL) 300 MG capsule Take 1 capsule (300 mg total) by mouth daily. 06/28/18   Milagros Loll, MD    Family History Family History  Problem Relation Age of Onset  . Healthy Mother   . Diabetes Mother     Social History Social History   Tobacco Use  . Smoking status: Never Smoker  . Smokeless tobacco: Never Used  Substance Use Topics  . Alcohol use: No  . Drug use: Yes    Types: Marijuana     Allergies   Benadryl [diphenhydramine hcl]   Review  of Systems Review of Systems  Constitutional: Positive for fatigue. Negative for fever.  HENT: Negative for congestion, ear pain, rhinorrhea and sore throat.   Eyes: Negative for visual disturbance.  Respiratory: Negative for cough, shortness of breath and wheezing.   Cardiovascular: Negative for chest pain.  Gastrointestinal: Negative for abdominal pain, nausea and vomiting.  Musculoskeletal: Negative for myalgias.  Skin: Negative for rash.  Neurological: Negative for dizziness, tremors, loss of consciousness, syncope, facial asymmetry, speech difficulty, weakness and headaches.  All other systems reviewed and are negative.    Physical Exam Updated Vital Signs Ht 5' (1.524 m)   Wt 38.6 kg   SpO2 100%   BMI 16.60 kg/m   Physical Exam Vitals signs and nursing note reviewed.  Constitutional:      General: She is not in acute distress.    Appearance: Normal appearance.  HENT:     Head: Normocephalic and atraumatic.     Nose: Nose normal.  Eyes:     Conjunctiva/sclera: Conjunctivae normal.     Pupils: Pupils are equal, round, and reactive to light.  Neck:     Musculoskeletal: Normal range of motion and neck supple.  Cardiovascular:     Rate and Rhythm: Normal rate and regular rhythm.     Pulses: Normal pulses.     Heart sounds: Normal heart sounds.  Pulmonary:     Effort: Pulmonary effort is normal.     Breath sounds: Normal breath sounds. No wheezing or rales.  Abdominal:     General: Bowel sounds are normal.     Tenderness: There is no abdominal tenderness.  Musculoskeletal: Normal range of motion.  Skin:    General: Skin is warm and dry.     Capillary Refill: Capillary refill takes less than 2 seconds.  Neurological:     General: No focal deficit present.     Mental Status: She is alert and oriented to person, place, and time.  Psychiatric:        Mood and Affect: Mood normal.        Behavior: Behavior normal.      ED Treatments / Results  Labs (all labs  ordered are listed, but only abnormal results are displayed) Results for orders placed or performed during the hospital encounter of 12/24/18  CBC with Differential/Platelet  Result Value Ref Range   WBC 6.4 4.0 - 10.5 K/uL   RBC 4.52 3.87 - 5.11 MIL/uL   Hemoglobin 12.8 12.0 - 15.0 g/dL   HCT 81.1 03.1 - 59.4 %   MCV 81.4 80.0 - 100.0 fL   MCH 28.3 26.0 - 34.0 pg   MCHC 34.8 30.0 - 36.0 g/dL   RDW 58.5 92.9 - 24.4 %   Platelets 252 150 -  400 K/uL   nRBC 0.0 0.0 - 0.2 %   Neutrophils Relative % 55 %   Neutro Abs 3.5 1.7 - 7.7 K/uL   Lymphocytes Relative 36 %   Lymphs Abs 2.3 0.7 - 4.0 K/uL   Monocytes Relative 7 %   Monocytes Absolute 0.5 0.1 - 1.0 K/uL   Eosinophils Relative 1 %   Eosinophils Absolute 0.0 0.0 - 0.5 K/uL   Basophils Relative 1 %   Basophils Absolute 0.1 0.0 - 0.1 K/uL   Immature Granulocytes 0 %   Abs Immature Granulocytes 0.01 0.00 - 0.07 K/uL  Basic metabolic panel  Result Value Ref Range   Sodium 138 135 - 145 mmol/L   Potassium 2.4 (LL) 3.5 - 5.1 mmol/L   Chloride 104 98 - 111 mmol/L   CO2 22 22 - 32 mmol/L   Glucose, Bld 104 (H) 70 - 99 mg/dL   BUN 6 6 - 20 mg/dL   Creatinine, Ser 1.610.44 0.44 - 1.00 mg/dL   Calcium 9.2 8.9 - 09.610.3 mg/dL   GFR calc non Af Amer >60 >60 mL/min   GFR calc Af Amer >60 >60 mL/min   Anion gap 12 5 - 15  Magnesium  Result Value Ref Range   Magnesium 2.0 1.7 - 2.4 mg/dL  I-Stat Beta hCG blood, ED ([facility], [facility], [facility] only)  Result Value Ref Range   I-stat hCG, quantitative <5.0 <5 mIU/mL   Comment 3           No results found.  EKG  EKG Interpretation  Date/Time:  Thursday Dec 24 2018 01:29:52 EDT Ventricular Rate:  68 PR Interval:    QRS Duration: 112 QT Interval:  417 QTC Calculation: 444 R Axis:   82 Text Interpretation:  Sinus rhythm Incomplete right bundle branch block Confirmed by Zahria Ding ([id number]) on 12/24/2018 1:46:51 AM       Procedures Procedures (including critical care time)  Medications Ordered in  ED Medications  potassium chloride 10 mEq in 100 mL IVPB (10 mEq Intravenous New Bag/Given 12/24/18 0319)  potassium chloride SA (K-DUR) CR tablet 80 mEq (80 mEq Oral Given 12/24/18 0319)    PO challenged successfully in the ED.  NO EKG changes.  Tolerated PO potassium.  I will increase home potassium and have patient follow up as an outpatient for potassium check.  Patient is amenable to this plan.    Initial Impression / Assessment and Plan / ED Course  I have reviewed the triage vital signs and the nursing notes.  Pertinent labs & imaging results that were available during my care of the patient were reviewed by me and considered in my medical decision making (see chart for details).     Final Clinical Impressions(s) / ED Diagnoses   Return for intractable cough, coughing up blood,fevers >100.4 unrelieved by medication, shortness of breath, intractable vomiting, chest pain, shortness of breath, weakness,numbness, changes in speech, facial asymmetry,abdominal pain, passing out,Inability to tolerate liquids or food, cough, altered mental status or any concerns. No signs of systemic illness or infection. The patient is nontoxic-appearing on exam and vital signs are within normal limits.   I have reviewed the triage vital signs and the nursing notes. Pertinent labs &imaging results that were available during my care of the patient were reviewed by me and considered in my medical decision making (see chart for details).  After history, exam, and medical workup I feel the patient has been appropriately medically screened and is safe for discharge home.  Pertinent diagnoses were discussed with the patient. Patient was given return precautions   Haidyn Chadderdon, MD 12/24/18 1610

## 2018-12-24 NOTE — ED Notes (Signed)
Patient verbalizes understanding of discharge instructions. Opportunity for questioning and answers were provided. Armband removed by staff, pt discharged from ED ambulatory.   

## 2018-12-24 NOTE — ED Triage Notes (Signed)
BIB [facility] from work with c/o of not feeling well. Pt with a hx of hypokalemia states it feels the same.

## 2018-12-24 NOTE — ED Notes (Signed)
Pt successful with PO challenge °

## 2018-12-28 ENCOUNTER — Other Ambulatory Visit: Payer: Self-pay

## 2018-12-28 DIAGNOSIS — E876 Hypokalemia: Secondary | ICD-10-CM

## 2018-12-29 ENCOUNTER — Encounter: Payer: Self-pay | Admitting: Family Medicine

## 2018-12-29 ENCOUNTER — Other Ambulatory Visit: Payer: Self-pay | Admitting: Family Medicine

## 2018-12-29 ENCOUNTER — Telehealth: Payer: Self-pay | Admitting: Family Medicine

## 2018-12-29 DIAGNOSIS — K909 Intestinal malabsorption, unspecified: Secondary | ICD-10-CM

## 2018-12-29 DIAGNOSIS — E876 Hypokalemia: Secondary | ICD-10-CM

## 2018-12-29 DIAGNOSIS — E440 Moderate protein-calorie malnutrition: Secondary | ICD-10-CM

## 2018-12-29 LAB — POTASSIUM: Potassium: 2.9 mmol/L — ABNORMAL LOW (ref 3.5–5.2)

## 2018-12-29 MED ORDER — POTASSIUM CHLORIDE CRYS ER 20 MEQ PO TBCR: EXTENDED_RELEASE_TABLET | ORAL | 0 refills | Status: DC

## 2018-12-29 NOTE — Progress Notes (Signed)
Your potassium remains rather low. I am sending a new prescription to the pharmacy for you to take Potassium 40 meq daily and 20 meq before bedtime.   Call the office to schedule a repeat lab visit for potassium check in 1 week for 01/05/19. Office phone [phone]   I am also placing a new referral for you to follow-up with gastroenterology (malabsorption and weight loss) and nephrology regarding chronically low potassium. Your current medical condition warrants speciality evaluation.

## 2018-12-31 NOTE — Telephone Encounter (Signed)
Erroneous

## 2019-01-05 ENCOUNTER — Other Ambulatory Visit: Payer: Self-pay

## 2019-01-05 DIAGNOSIS — E876 Hypokalemia: Secondary | ICD-10-CM

## 2019-01-06 LAB — POTASSIUM: Potassium: 3.7 mmol/L (ref 3.5–5.2)

## 2019-01-21 ENCOUNTER — Other Ambulatory Visit: Payer: Self-pay

## 2019-01-25 ENCOUNTER — Other Ambulatory Visit: Payer: Self-pay

## 2019-01-25 DIAGNOSIS — E876 Hypokalemia: Secondary | ICD-10-CM

## 2019-01-25 NOTE — Progress Notes (Signed)
Patient here for potassium recheck. 

## 2019-01-26 ENCOUNTER — Inpatient Hospital Stay (HOSPITAL_COMMUNITY)
Admission: EM | Admit: 2019-01-26 | Discharge: 2019-01-27 | DRG: 641 | Disposition: A | Discharge status: Home / Self Care | Payer: Self-pay | Attending: Family Medicine | Admitting: Family Medicine

## 2019-01-26 ENCOUNTER — Other Ambulatory Visit: Payer: Self-pay | Admitting: Family Medicine

## 2019-01-26 ENCOUNTER — Encounter (HOSPITAL_COMMUNITY): Payer: Self-pay | Admitting: Emergency Medicine

## 2019-01-26 ENCOUNTER — Other Ambulatory Visit: Payer: Self-pay

## 2019-01-26 ENCOUNTER — Telehealth: Payer: Self-pay | Admitting: Family Medicine

## 2019-01-26 DIAGNOSIS — G8929 Other chronic pain: Secondary | ICD-10-CM | POA: Diagnosis present

## 2019-01-26 DIAGNOSIS — Z1159 Encounter for screening for other viral diseases: Secondary | ICD-10-CM

## 2019-01-26 DIAGNOSIS — Z833 Family history of diabetes mellitus: Secondary | ICD-10-CM

## 2019-01-26 DIAGNOSIS — D539 Nutritional anemia, unspecified: Secondary | ICD-10-CM | POA: Diagnosis present

## 2019-01-26 DIAGNOSIS — Z681 Body mass index (BMI) 19 or less, adult: Secondary | ICD-10-CM

## 2019-01-26 DIAGNOSIS — E876 Hypokalemia: Secondary | ICD-10-CM

## 2019-01-26 DIAGNOSIS — Z9049 Acquired absence of other specified parts of digestive tract: Secondary | ICD-10-CM

## 2019-01-26 DIAGNOSIS — R531 Weakness: Secondary | ICD-10-CM

## 2019-01-26 DIAGNOSIS — Z886 Allergy status to analgesic agent status: Secondary | ICD-10-CM

## 2019-01-26 DIAGNOSIS — E440 Moderate protein-calorie malnutrition: Secondary | ICD-10-CM | POA: Diagnosis present

## 2019-01-26 DIAGNOSIS — K529 Noninfective gastroenteritis and colitis, unspecified: Secondary | ICD-10-CM | POA: Diagnosis present

## 2019-01-26 DIAGNOSIS — K909 Intestinal malabsorption, unspecified: Secondary | ICD-10-CM | POA: Diagnosis present

## 2019-01-26 DIAGNOSIS — F1290 Cannabis use, unspecified, uncomplicated: Secondary | ICD-10-CM | POA: Diagnosis present

## 2019-01-26 DIAGNOSIS — Z597 Insufficient social insurance and welfare support: Secondary | ICD-10-CM

## 2019-01-26 LAB — URINALYSIS, ROUTINE W REFLEX MICROSCOPIC
Bilirubin Urine: NEGATIVE
Glucose, UA: NEGATIVE mg/dL
Hgb urine dipstick: NEGATIVE
Ketones, ur: NEGATIVE mg/dL
Leukocytes,Ua: NEGATIVE
Nitrite: NEGATIVE
Protein, ur: NEGATIVE mg/dL
Specific Gravity, Urine: 1.009 (ref 1.005–1.030)
pH: 7 (ref 5.0–8.0)

## 2019-01-26 LAB — POTASSIUM
Potassium: 2.6 mmol/L — ABNORMAL LOW (ref 3.5–5.2)
Potassium: 2.4 mmol/L — CL (ref 3.5–5.1)

## 2019-01-26 LAB — CBC
HCT: 42.3 % (ref 36.0–46.0)
Hemoglobin: 14.5 g/dL (ref 12.0–15.0)
MCH: 28.5 pg (ref 26.0–34.0)
MCHC: 34.3 g/dL (ref 30.0–36.0)
MCV: 83.1 fL (ref 80.0–100.0)
Platelets: 277 10*3/uL (ref 150–400)
RBC: 5.09 MIL/uL (ref 3.87–5.11)
RDW: 15.1 % (ref 11.5–15.5)
WBC: 7.8 10*3/uL (ref 4.0–10.5)
nRBC: 0 % (ref 0.0–0.2)

## 2019-01-26 LAB — I-STAT BETA HCG BLOOD, ED (MC, WL, AP ONLY): I-stat hCG, quantitative: <5 m[IU]/mL (ref <5)

## 2019-01-26 LAB — BASIC METABOLIC PANEL
Anion gap: 10 (ref 5–15)
BUN: <5 mg/dL — ABNORMAL LOW (ref 6–20)
CO2: 24 mmol/L (ref 22–32)
Calcium: 9.6 mg/dL (ref 8.9–10.3)
Chloride: 106 mmol/L (ref 98–111)
Creatinine, Ser: 0.51 mg/dL (ref 0.44–1.00)
GFR calc Af Amer: >60 mL/min (ref >60)
GFR calc non Af Amer: >60 mL/min (ref >60)
Glucose, Bld: 104 mg/dL — ABNORMAL HIGH (ref 70–99)
Potassium: 2.4 mmol/L — CL (ref 3.5–5.1)
Sodium: 140 mmol/L (ref 135–145)

## 2019-01-26 LAB — LIPASE, BLOOD: Lipase: 29 U/L (ref 11–51)

## 2019-01-26 LAB — MAGNESIUM: Magnesium: 1.9 mg/dL (ref 1.7–2.4)

## 2019-01-26 MED ORDER — POTASSIUM CHLORIDE 10 MEQ/100ML IV SOLN
10.0000 meq | INTRAVENOUS | Status: AC
  Administered 2019-01-26: 10 meq via INTRAVENOUS
  Filled 2019-01-26: qty 100

## 2019-01-26 MED ORDER — DICYCLOMINE HCL 20 MG PO TABS
20.0000 mg | ORAL_TABLET | Freq: Three times a day (TID) | ORAL | Status: DC
  Administered 2019-01-27 (×2): 20 mg via ORAL
  Filled 2019-01-26 (×2): qty 1

## 2019-01-26 MED ORDER — LOPERAMIDE HCL 2 MG PO CAPS
4.0000 mg | ORAL_CAPSULE | Freq: Every day | ORAL | Status: DC
  Administered 2019-01-27 (×2): 4 mg via ORAL
  Filled 2019-01-26 (×2): qty 2

## 2019-01-26 MED ORDER — ACETAMINOPHEN 650 MG RE SUPP: 650.0000 mg | Freq: Four times a day (QID) | RECTAL | Status: DC | PRN

## 2019-01-26 MED ORDER — MAGNESIUM SULFATE 2 GM/50ML IV SOLN
2.0000 g | Freq: Once | INTRAVENOUS | Status: AC
  Administered 2019-01-26: 2 g via INTRAVENOUS
  Filled 2019-01-26: qty 50

## 2019-01-26 MED ORDER — POTASSIUM CHLORIDE 10 MEQ/100ML IV SOLN
10.0000 meq | Freq: Once | INTRAVENOUS | Status: AC
  Administered 2019-01-26: 10 meq via INTRAVENOUS
  Filled 2019-01-26: qty 100

## 2019-01-26 MED ORDER — SODIUM CHLORIDE 0.9 % IV SOLN
INTRAVENOUS | Status: DC
  Administered 2019-01-27: 01:00:00 via INTRAVENOUS

## 2019-01-26 MED ORDER — POTASSIUM CHLORIDE 10 MEQ/100ML IV SOLN
10.0000 meq | INTRAVENOUS | Status: DC
  Administered 2019-01-27: 10 meq via INTRAVENOUS
  Filled 2019-01-26: qty 100

## 2019-01-26 MED ORDER — FAMOTIDINE IN NACL 20-0.9 MG/50ML-% IV SOLN
20.0000 mg | Freq: Once | INTRAVENOUS | Status: AC
  Administered 2019-01-26: 20 mg via INTRAVENOUS
  Filled 2019-01-26: qty 50

## 2019-01-26 MED ORDER — SODIUM CHLORIDE 0.9% FLUSH
3.0000 mL | Freq: Once | INTRAVENOUS | Status: AC
  Administered 2019-01-26: 3 mL via INTRAVENOUS

## 2019-01-26 MED ORDER — ONDANSETRON HCL 4 MG/2ML IJ SOLN
4.0000 mg | Freq: Once | INTRAMUSCULAR | Status: AC
  Administered 2019-01-26: 4 mg via INTRAVENOUS
  Filled 2019-01-26: qty 2

## 2019-01-26 MED ORDER — ENOXAPARIN SODIUM 30 MG/0.3ML ~~LOC~~ SOLN
30.0000 mg | SUBCUTANEOUS | Status: DC
  Filled 2019-01-26: qty 0.3

## 2019-01-26 MED ORDER — HYDROMORPHONE HCL 1 MG/ML IJ SOLN
0.5000 mg | Freq: Once | INTRAMUSCULAR | Status: AC
  Administered 2019-01-26: 0.5 mg via INTRAVENOUS
  Filled 2019-01-26: qty 1

## 2019-01-26 MED ORDER — POTASSIUM CHLORIDE CRYS ER 20 MEQ PO TBCR
40.0000 meq | EXTENDED_RELEASE_TABLET | ORAL | Status: AC
  Administered 2019-01-27 (×2): 40 meq via ORAL
  Filled 2019-01-26 (×2): qty 2

## 2019-01-26 MED ORDER — ACETAMINOPHEN 325 MG PO TABS: 650.0000 mg | ORAL_TABLET | Freq: Four times a day (QID) | ORAL | Status: DC | PRN

## 2019-01-26 MED ORDER — POTASSIUM CHLORIDE CRYS ER 20 MEQ PO TBCR
80.0000 meq | EXTENDED_RELEASE_TABLET | Freq: Once | ORAL | Status: AC
  Administered 2019-01-26: 80 meq via ORAL
  Filled 2019-01-26: qty 4

## 2019-01-26 NOTE — ED Triage Notes (Signed)
Patient reports her doctor sent her over for potassium of 2.6 - had blood work yesterday for routine check d/t history of hypokalemia. Patient states she has had diarrhea off and on for weeks, but has low potassium frequently since 2014. Denies chest pain, shortness of breath, N/V, fevers. She endorses abdominal cramping and chills.

## 2019-01-26 NOTE — Telephone Encounter (Signed)
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 She is requesting a work note be sent to her via Pharmacist, community.

## 2019-01-26 NOTE — ED Notes (Signed)
Gold and blue top drawn on pt 

## 2019-01-26 NOTE — Progress Notes (Signed)
Potasium standing order.

## 2019-01-26 NOTE — Telephone Encounter (Signed)
Advise patient to go to the ER. She needs IV potassium. Oral K+ is not absorbing. She will need to follow-up here in 1 week for a repeat K+

## 2019-01-26 NOTE — Telephone Encounter (Signed)
Called patient & notified her of lab results & recommendations. Expressed understanding. States that she will head to the ER now. Made lab appointment for 6/23.

## 2019-01-26 NOTE — ED Notes (Signed)
ED TO INPATIENT HANDOFF REPORT  ED Nurse Name and Phone #: Caleen Jobsray Jorda [phone]  S Name/Age/Gender Sara Hill 28 y.o. female Room/Bed: 036C/036C  Code Status   Code Status: Prior  Home/SNF/Other Home Patient oriented to: self, place, time and situation Is this baseline? Yes   Triage Complete: Triage complete  Chief Complaint low potassium  Triage Note Patient reports her doctor sent her over for potassium of 2.6 - had blood work yesterday for routine check d/t history of hypokalemia. Patient states she has had diarrhea off and on for weeks, but has low potassium frequently since 2014. Denies chest pain, shortness of breath, N/V, fevers. She endorses abdominal cramping and chills.    Allergies Allergies  Allergen Reactions  . Benadryl [Diphenhydramine Hcl] Itching and Swelling    Pt reeports "feels like throat swelling".     Level of Care/Admitting Diagnosis ED Disposition    ED Disposition Condition Comment   Admit  [facility] [[id number]]  Level of Care: Telemetry Medical [104]  Covid Evaluation: N/A  Diagnosis: Acute hypokalemia [562130][172186]  Admitting Physician: Moses MannersHENSEL, WILLIAM A [[id number]]  Attending Physician: Moses MannersHENSEL, WILLIAM A [[id number]]  Estimated length of stay: past midnight tomorrow  Certification:: I certify this patient will need inpatient services for at least 2 midnights  PT Class (Do Not Modify): Inpatient [101]  PT Acc Code (Do Not Modify): Private [1]       B Medical/Surgery History Past Medical History:  Diagnosis Date  . Gall stones   . Low blood potassium    Past Surgical History:  Procedure Laterality Date  . CHOLECYSTECTOMY    . COLONOSCOPY  01/2018   normal  . ESOPHAGOGASTRODUODENOSCOPY  01/2018   normal     A IV Location/Drains/Wounds Patient Lines/Drains/Airways Status   Active Line/Drains/Airways    Name:   Placement date:   Placement time:   Site:   Days:   Peripheral IV 01/26/19 Right  Antecubital   01/26/19    1641    Antecubital   less than 1          Intake/Output Last 24 hours  Intake/Output Summary (Last 24 hours) at 01/26/2019 2254 Last data filed at 01/26/2019 2158 Gross per 24 hour  Intake 163.06 ml  Output -  Net 163.06 ml    Labs/Imaging Results for orders placed or performed during the hospital encounter of 01/26/19 (from the past 48 hour(s))  Basic metabolic panel     Status: Abnormal   Collection Time: 01/26/19  3:53 PM  Result Value Ref Range   Sodium 140 135 - 145 mmol/L   Potassium 2.4 (LL) 3.5 - 5.1 mmol/L    Comment: CRITICAL RESULT CALLED TO, READ BACK BY AND VERIFIED WITHCaroline Sauger: D WILLIAMS RN AT 1638 ON [id number] BY K FORSYTH    Chloride 106 98 - 111 mmol/L   CO2 24 22 - 32 mmol/L   Glucose, Bld 104 (H) 70 - 99 mg/dL   BUN <5 (L) 6 - 20 mg/dL   Creatinine, Ser 6.290.51 0.44 - 1.00 mg/dL   Calcium 9.6 8.9 - 52.810.3 mg/dL   GFR calc non Af Amer >60 >60 mL/min   GFR calc Af Amer >60 >60 mL/min   Anion gap 10 5 - 15    Comment: Performed at [facility], [address] [place], [place] [address]  CBC     Status: None   Collection Time: 01/26/19  3:53 PM  Result Value Ref Range  WBC 7.8 4.0 - 10.5 K/uL   RBC 5.09 3.87 - 5.11 MIL/uL   Hemoglobin 14.5 12.0 - 15.0 g/dL   HCT 81.142.3 91.436.0 - 78.246.0 %   MCV 83.1 80.0 - 100.0 fL   MCH 28.5 26.0 - 34.0 pg   MCHC 34.3 30.0 - 36.0 g/dL   RDW 95.615.1 21.311.5 - 08.615.5 %   Platelets 277 150 - 400 K/uL   nRBC 0.0 0.0 - 0.2 %    Comment: Performed at [facility], [address] [place], [place] [address]  Urinalysis, Routine w reflex microscopic     Status: None   Collection Time: 01/26/19  3:53 PM  Result Value Ref Range   Color, Urine YELLOW YELLOW   APPearance CLEAR CLEAR   Specific Gravity, Urine 1.009 1.005 - 1.030   pH 7.0 5.0 - 8.0   Glucose, UA NEGATIVE NEGATIVE mg/dL   Hgb urine dipstick NEGATIVE NEGATIVE   Bilirubin Urine NEGATIVE NEGATIVE   Ketones, ur NEGATIVE NEGATIVE mg/dL   Protein, ur  NEGATIVE NEGATIVE mg/dL   Nitrite NEGATIVE NEGATIVE   Leukocytes,Ua NEGATIVE NEGATIVE    Comment: Performed at [facility], [address] [place], [place] [address]  Magnesium     Status: None   Collection Time: 01/26/19  3:57 PM  Result Value Ref Range   Magnesium 1.9 1.7 - 2.4 mg/dL    Comment: Performed at [facility], [address] [place], [place] [address]  I-Stat beta hCG blood, ED     Status: None   Collection Time: 01/26/19  4:12 PM  Result Value Ref Range   I-stat hCG, quantitative <5.0 <5 mIU/mL   Comment 3            Comment:   GEST. AGE      CONC.  (mIU/mL)   <=1 WEEK        5 - 50     2 WEEKS       50 - 500     3 WEEKS       100 - 10,000     4 WEEKS     1,000 - 30,000        FEMALE AND NON-PREGNANT FEMALE:     LESS THAN 5 mIU/mL   Lipase, blood     Status: None   Collection Time: 01/26/19  4:34 PM  Result Value Ref Range   Lipase 29 11 - 51 U/L    Comment: Performed at [facility], [address] [place], [place] [address]  Potassium     Status: Abnormal   Collection Time: 01/26/19  8:59 PM  Result Value Ref Range   Potassium 2.4 (LL) 3.5 - 5.1 mmol/L    Comment: CRITICAL RESULT CALLED TO, READ BACK BY AND VERIFIED WITH: GIBSON,K RN 01/26/2019 2142 JORDANS Performed at [facility], [address] [place], [place] [address]    No results found.  Pending Labs Unresulted Labs (From admission, onward)   None      Vitals/Pain Today's Vitals   01/26/19 1845 01/26/19 1900 01/26/19 1918 01/26/19 2015  BP: 110/76 109/69  105/68  Pulse: 66 62  64  Resp: 15 19  19   Temp:      TempSrc:      SpO2: 100% 100%  100%  PainSc:   10-Worst pain ever     Isolation Precautions No active isolations  Medications Medications  potassium chloride 10 mEq in 100 mL  IVPB (0 mEq Intravenous Stopped 01/26/19 1913)  sodium chloride flush (NS) 0.9 % injection 3 mL (3 mLs Intravenous Given 01/26/19 1810)  potassium chloride SA (K-DUR) CR tablet  80 mEq (80 mEq Oral Given 01/26/19 1752)  magnesium sulfate IVPB 2 g 50 mL (0 g Intravenous Stopped 01/26/19 2017)  ondansetron (ZOFRAN) injection 4 mg (4 mg Intravenous Given 01/26/19 1804)  HYDROmorphone (DILAUDID) injection 0.5 mg (0.5 mg Intravenous Given 01/26/19 1805)  famotidine (PEPCID) IVPB 20 mg premix (0 mg Intravenous Stopped 01/26/19 2158)  potassium chloride 10 mEq in 100 mL IVPB (10 mEq Intravenous New Bag/Given 01/26/19 2128)    Mobility walks Moderate fall risk   Focused Assessments Cardiac Assessment Handoff:  Cardiac Rhythm: Normal sinus rhythm No results found for: CKTOTAL, CKMB, CKMBINDEX, TROPONINI No results found for: DDIMER Does the Patient currently have chest pain? No      R Recommendations: See Admitting Provider Note  Report given to:   Additional Notes:

## 2019-01-26 NOTE — ED Notes (Signed)
Pt is refusing Covid testing and states that she will leave AMA; Charge nurse informed; pt given option to be treated as Covid pos pt but states that she will not be doing that either; MD notified and will come to speak with patient.

## 2019-01-26 NOTE — ED Provider Notes (Signed)
[facility] EMERGENCY DEPARTMENT Provider Note   CSN: [id number] Arrival date & time: 01/26/19  1542    History   Chief Complaint Chief Complaint  Patient presents with  . Hypokalemia    HPI Sara Hill is a 28 y.o. female with history of severe protein calorie malnutrition, chronic hypokalemia, generalized weakness, chronic abdominal pain, chronic diarrhea presents sent by her PCP for evaluation of hypokalemia.  She reports generalized weakness and intermittent facial tingling which is not unusual for her when her potassium levels are low.  Reports that she has been experiencing low potassium levels for the last several months and is currently following up with her PCP every 2 weeks for repeat blood work.  She states she has been compliant with her potassium supplementation but has not had any magnesium supplementation recently.  Notes chronic epigastric abdominal pain, chronic watery nonbloody stools.  Notes nausea but no vomiting.  Denies chest pain or shortness of breath.  She expresses frustration with her current medical condition and states "no one can get to the bottom of this, I'm tired of missing work".  She does report the only thing that is helped with her abdominal pain is Creon but states this was too expensive.     The history is provided by the patient.    Past Medical History:  Diagnosis Date  . Gall stones   . Low blood potassium     Patient Active Problem List   Diagnosis Date Noted  . Acute hypokalemia 01/26/2019  . Dehydration 11/01/2018  . Diarrhea 11/01/2018  . Abdominal pain 11/01/2018  . Generalized weakness 11/01/2018  . Nausea and vomiting   . Severe protein-calorie malnutrition (HCC)   . Protein-calorie malnutrition, severe 06/29/2018  . Hypokalemia 06/27/2018    Past Surgical History:  Procedure Laterality Date  . CHOLECYSTECTOMY    . COLONOSCOPY  01/2018   normal  . ESOPHAGOGASTRODUODENOSCOPY  01/2018   normal     OB History   No obstetric history on file.      Home Medications    Prior to Admission medications   Medication Sig Start Date End Date Taking? Authorizing Provider  dicyclomine (BENTYL) 20 MG tablet Take 1 tablet (20 mg total) by mouth 3 (three) times daily before meals. 12/22/18  Yes Bing NeighborsHarris, Kimberly S, FNP  MAGNESIUM PO Take 1 tablet by mouth daily.   Yes [provider]  potassium chloride SA (K-DUR) 20 MEQ tablet Take 40 meq PO every morning. Take 20 Meq PO every night at bedtime Patient taking differently: Take 20-40 mEq by mouth See admin instructions. Take 2 tablets by mouth every morning and Take 1 tablet every night at bedtime 12/29/18  Yes Bing NeighborsHarris, Kimberly S, FNP  cholestyramine (QUESTRAN) 4 g packet Take 1 packet (4 g total) by mouth 3 (three) times daily. Patient not taking: Reported on 01/26/2019 12/22/18   Bing NeighborsHarris, Kimberly S, FNP  ondansetron (ZOFRAN) 4 MG tablet Take 1 tablet (4 mg total) by mouth every 8 (eight) hours as needed for nausea or vomiting. Patient not taking: Reported on 01/26/2019 12/22/18   Bing NeighborsHarris, Kimberly S, FNP  ursodiol (ACTIGALL) 300 MG capsule Take 1 capsule (300 mg total) by mouth daily. Patient not taking: Reported on 01/26/2019 06/28/18   Milagros LollSudini, Srikar, MD    Family History Family History  Problem Relation Age of Onset  . Healthy Mother   . Diabetes Mother     Social History Social History   Tobacco Use  . Smoking  status: Never Smoker  . Smokeless tobacco: Never Used  Substance Use Topics  . Alcohol use: No  . Drug use: Yes    Types: Marijuana     Allergies   Benadryl [diphenhydramine hcl]   Review of Systems Review of Systems  Constitutional: Positive for fatigue. Negative for chills and fever.  Respiratory: Negative for shortness of breath.   Cardiovascular: Negative for chest pain.  Gastrointestinal: Positive for abdominal pain, diarrhea and nausea. Negative for vomiting.  Neurological: Positive for weakness  (generalized) and light-headedness. Negative for syncope.  All other systems reviewed and are negative.    Physical Exam Updated Vital Signs BP 101/62   Pulse 70   Temp 98.5 F (36.9 C) (Oral)   Resp 19   LMP 01/19/2019 (Approximate)   SpO2 100%   Physical Exam Vitals signs and nursing note reviewed.  Constitutional:      General: She is not in acute distress.    Appearance: She is well-developed.     Comments: Very thin, appears somewhat anxious  HENT:     Head: Normocephalic and atraumatic.  Eyes:     General:        Right eye: No discharge.        Left eye: No discharge.     Conjunctiva/sclera: Conjunctivae normal.  Neck:     Musculoskeletal: Neck supple.     Vascular: No JVD.     Trachea: No tracheal deviation.  Cardiovascular:     Rate and Rhythm: Normal rate and regular rhythm.     Pulses: Normal pulses.  Pulmonary:     Effort: Pulmonary effort is normal.     Breath sounds: Normal breath sounds.  Abdominal:     General: Abdomen is flat. Bowel sounds are normal. There is no distension.     Palpations: Abdomen is soft.     Tenderness: There is abdominal tenderness in the epigastric area. There is no guarding or rebound.  Skin:    General: Skin is warm and dry.     Findings: No erythema.  Neurological:     Mental Status: She is alert.     Comments: Fluent speech, no facial droop, cranial nerves appear grossly intact, moves extremities spontaneously  with intact strength.  Psychiatric:        Behavior: Behavior normal.      ED Treatments / Results  Labs (all labs ordered are listed, but only abnormal results are displayed) Labs Reviewed  BASIC METABOLIC PANEL - Abnormal; Notable for the following components:      Result Value   Potassium 2.4 (*)    Glucose, Bld 104 (*)    BUN <5 (*)    All other components within normal limits  POTASSIUM - Abnormal; Notable for the following components:   Potassium 2.4 (*)    All other components within normal  limits  CBC  URINALYSIS, ROUTINE W REFLEX MICROSCOPIC  MAGNESIUM  LIPASE, BLOOD  I-STAT BETA HCG BLOOD, ED ([facility], [facility], [facility] ONLY)    EKG EKG Interpretation  Date/Time:  Tuesday January 26 2019 15:51:29 EDT Ventricular Rate:  64 PR Interval:  158 QRS Duration: 100 QT Interval:  414 QTC Calculation: 427 R Axis:   91 Text Interpretation:  Normal sinus rhythm Rightward axis Incomplete right bundle branch block Abnormal ECG No significant change since last tracing Confirmed by Benjiman CorePickering, Nathan [phone]([id number]) on 01/26/2019 4:00:20 PM   Radiology No results found.  Procedures .Critical Care Performed by: Jeanie SewerFawze, Jayston Trevino A, PA-C Authorized by: Luevenia MaxinFawze,  Gavin Pound, PA-C   Critical care provider statement:    Critical care time (minutes):  40   Critical care was necessary to treat or prevent imminent or life-threatening deterioration of the following conditions:  Metabolic crisis   Critical care was time spent personally by me on the following activities:  Discussions with consultants, evaluation of patient's response to treatment, examination of patient, ordering and performing treatments and interventions, ordering and review of laboratory studies, ordering and review of radiographic studies, pulse oximetry, re-evaluation of patient's condition, obtaining history from patient or surrogate and review of old charts   (including critical care time)  Medications Ordered in ED Medications  potassium chloride 10 mEq in 100 mL IVPB (0 mEq Intravenous Stopped 01/26/19 1913)  sodium chloride flush (NS) 0.9 % injection 3 mL (3 mLs Intravenous Given 01/26/19 1810)  potassium chloride SA (K-DUR) CR tablet 80 mEq (80 mEq Oral Given 01/26/19 1752)  magnesium sulfate IVPB 2 g 50 mL (0 g Intravenous Stopped 01/26/19 2017)  ondansetron (ZOFRAN) injection 4 mg (4 mg Intravenous Given 01/26/19 1804)  HYDROmorphone (DILAUDID) injection 0.5 mg (0.5 mg Intravenous Given 01/26/19 1805)  famotidine (PEPCID) IVPB 20 mg premix (0 mg  Intravenous Stopped 01/26/19 2158)  potassium chloride 10 mEq in 100 mL IVPB (0 mEq Intravenous Stopped 01/26/19 2228)     Initial Impression / Assessment and Plan / ED Course  I have reviewed the triage vital signs and the nursing notes.  Pertinent labs & imaging results that were available during my care of the patient were reviewed by me and considered in my medical decision making (see chart for details).        Patient presents sent from PCP for evaluation of hypokalemia.  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  She is some epigastric abdominal pain which is chronic, no peritoneal signs on examination of the abdomen.  Lab work reviewed by me shows potassium of 2.4, no leukocytosis, no anemia, no metabolic derangements.  LFTs, lipase, creatinine are within normal limits.  Her EKG shows normal sinus rhythm, incomplete right bundle branch block but no significant changes from last tracing and QTC is within normal limits do not feel that she requires emergent imaging of the abdomen at this time and have a low suspicion of acute surgical abdominal pathology.  She was given oral and IV potassium as well as magnesium in the ED and her potassium was rechecked with no improvement.  For this reason, I think she would benefit from admission to the hospital for further evaluation and management of her hypokalemia.  Family medicine teaching service to admit.  Final Clinical Impressions(s) / ED Diagnoses   Final diagnoses:  Hypokalemia    ED Discharge Orders    None       Debroah Baller 01/26/19 2320    Davonna Belling, MD 01/26/19 [phone]

## 2019-01-26 NOTE — Progress Notes (Signed)
CSW met with patient and confirmed address and phone number. CSW noted request for medication assistance. CSW received consent to leave information for follow-up for RN CM. CSW noted request for assistance on applying for disability. CSW provided psychoeducation on the process and provided patient with resources to support her in applying.   Lamonte Richer, LCSW, [place] Worker II [phone]

## 2019-01-26 NOTE — Telephone Encounter (Signed)
Sent work excuse via my chart

## 2019-01-26 NOTE — H&P (Addendum)
[facility] Ad[facility] History and Physical Service Pager: [phone]  Patient name: Sara Hill Medical record number: [id number] Date of birth: 01-25-1991 Age: 28 y.o. Gender: female  Primary Care Provider: Bing NeighborsHarris, Kimberly S, FNP Consultants: None Code Status: Full  Preferred Emergency Contact: Susanne BordersKia Lynch, girlfriend, [phone]  Chief Complaint: hypokalemia  Assessment and Plan: Sara Sara Hill is a 28 y.o. female presenting with hypokalemia and generalized weakness. PMH is significant for chronic diarrhea, chronic hypokalemia, s/p cholecystectomy in 2014, protein calorie malnutrition.  Acute on Chronic Hypokalemia Patient has has hypokalemia since 2014 following cholecystectomy 2/2 malabsorption and chronic diarrhea.   Has not seen GI as outpatient.  When asked about this she says "I just never went" but agrees to see one if we can arrange it.  At home, patient takes K-Dur 40 mEq QAM and 20 mEq QHS, (firmly states she is compliant) which potassium checks q2wks at PCP office.  Yesterday, seen by PCP and K was 2.6, prior to this, on 5/26 was 3.7.  She was advised to come to ED.  On presentation was 2.4, Mag 1.9.  She notes that she has been experiencing fatigue and generalized weakness, no chest pain or SOB.  Given K-Dur 80 mEq x1 orally, KCl 10 mEq x2 IV, and Mag 2g IV, with repeat K 2.4.  She has continued to have 3 episodes of diarrhea while in the ED.  CBC WNL and without evidence of infection.  EKG on admission without prolonged QTc and appears unchanged from previous EKG in May and March 2020.  Vitals are stable and patient has been afebrile.  On exam, neurologically intact with 5/5 strength BUE/BLE.  For repletion, would prefer KCl solution orally for better absorption, but patient requests pills, and would not give more than 40 mEq at a time, as is max potassium that can be absorbed by the gut at once.  Will also give IV runs.  Will attempt to  decrease diarrhea with imodium as she states this has worked in the past.  Would hold off on starting Creon if she will not be able to afford as outpatient. - Admit to inpatient, Dr. Cyndia SkeetersHensel's service - cardiac monitoring - AM EKG - vitals per floor protocol - K-Dur 40 mEq q2h x2 - KCl IV 10 mEq x4 - gentle fluids at 50 cc/hr to run with KCl - BMPs q6h, will decrease frequency when K >3 - CMP, Mag, CBC in AM - consider GI consult in AM - imodium QD to improve diarrhea - cont home bentyl  - consider starting Creon in AM if will be able to afford as outpatient or if no improvement in diarrhea and K on imodium - CM and SW consult for insurance and medication cost assistance - Nutrition consult - weigh patient on arrival to floor  - Regular Diet - Lovenox for DVT PPx  Intestinal Malabsorption: Chronic  Chronic diarrhea since cholecystectomy in 2014.  Has been on Bentyl 3 times daily at home.  She has previously been prescribed Creon, cholestyramine, ursodiol, but has not been able to continue them due to cost.   Per d/c summary from 06/29/18 at [facility], patient had EGD and Colonoscopy at outside hospital in June 2019 that were normal.  In November, patient also had negative stool studies and negative HIV. CT abd/pelvis during admission in March 2020 with no acute intra-abdominal or intrapelvic abnormalities.  During that hospitalization, patient had been complaining of RUQ pain and US was ordered, but patient left  AMA before this was obtained. On admission, Lipase WNL at 29.  She reports that she has not seen GI since she was at [facility] in June 2019. - plan per above  Protein Calorie Malnurtition: Chronic Likely 2/2 malabsorption.  Albumin not obtained on admission.  Last albumin in March 2020 was 3.3.  Weight seems stable for past few years on chart review - obtain CMP in AM - nutrition consult -weigh patient on arrival to floor  Hx of hyperglycemia:  Minor  elevations, no record of DM hx but also no recorded A1c -f/u on A1C  THC Use  Reports occasional THC use, last yesterday. - obtain UDS   Insurance Needs and Medication Cost Assistance Patient has been unable to afford medcaitons due to lack of insurance and cost. -CSW and CM consult  FEN/GI: Regular Prophylaxis: Lovenox  Disposition: admit to inpatient, medical telemetry  History of Present Illness:  Sara Hill is a 28 y.o. female presenting with hypokalemia and generalized weakness.  Patient has a history of chronic hypokalemia secondary to chronic diarrhea since 2014 following cholecystectomy and follows with her PCP to receive BMPs every 2 weeks.  She reports that she takes 40 mEq K-Dur every morning and 20 mEq nightly, but states that she occasionally misses doses.  States that she believes she has missed some doses in the last few weeks.  Her potassium on check on 6/15 was 2.6, therefore PCP advised her to come to ED for repletion.  Patient also endorses that she has been feeling weak and "drained" for the last few days.  She states "I can tell my potassium is low because I feel tired and drained and my hands cramp."  She denies any chest pain, shortness of breath.  In the ED, patient's K2.4, mag 1.9.  Patient received IV magnesium 2 g, K-Dur 80 mEq x 1, KCl 10 mEq x 2 IV.  Repeat potassium was 2.4.  Patient reports that she had 3 episodes of diarrhea in the ED.  Patient states that she chronically has 10 watery bowel movements a day.  She denies any blood in her stool.  She also endorses chronic left upper quadrant/epigastric abdominal pain.  She states that she does not have this pain at present, but that it comes on randomly and that Bentyl sometimes improves the pain.  She states that pain is occasionally worse with eating, but not always.  Patient states that she has been on Creon in the past that helped her greatly, but she has only been on that while inpatient because  she is not able to afford it as an outpatient.  She also notes that she has been prescribed cholestyramine and ursodiol, but has not been able to take them due to cost.   Review Of Systems: Per HPI with the following additions:   Review of Systems  Constitutional: Positive for chills (chronic), malaise/fatigue and weight loss. Negative for fever.  HENT: Negative for sore throat.   Respiratory: Negative for cough and shortness of breath.   Cardiovascular: Negative for chest pain.  Gastrointestinal: Positive for abdominal pain and diarrhea. Negative for blood in stool, nausea and vomiting.  Genitourinary: Negative for dysuria.  Musculoskeletal: Negative for myalgias.  Neurological: Positive for weakness. Negative for tingling and headaches.    Patient Active Problem List   Diagnosis Date Noted  . Chronic diarrhea   . Does not have health insurance   . Acute hypokalemia 01/26/2019  . Dehydration 11/01/2018  . Diarrhea 11/01/2018  .  Abdominal pain 11/01/2018  . Weakness 11/01/2018  . Nausea and vomiting   . Severe protein-calorie malnutrition (HCC)   . Protein-calorie malnutrition, severe 06/29/2018  . Hypokalemia 06/27/2018    Past Medical History: Past Medical History:  Diagnosis Date  . Gall stones   . Low blood potassium     Past Surgical History: Past Surgical History:  Procedure Laterality Date  . CHOLECYSTECTOMY    . COLONOSCOPY  01/2018   normal  . ESOPHAGOGASTRODUODENOSCOPY  01/2018   normal    Social History: Social History   Tobacco Use  . Smoking status: Never Smoker  . Smokeless tobacco: Never Used  Substance Use Topics  . Alcohol use: No  . Drug use: Yes    Types: Marijuana   Additional social history: THC use yesterday  Please also refer to relevant sections of EMR.  Family History: Family History  Problem Relation Age of Onset  . Healthy Mother   . Diabetes Mother     Allergies and Medications: Allergies  Allergen Reactions  .  Benadryl [Diphenhydramine Hcl] Itching and Swelling    Pt reeports "feels like throat swelling".    No current facility-administered medications on file prior to encounter.    Current Outpatient Medications on File Prior to Encounter  Medication Sig Dispense Refill  . dicyclomine (BENTYL) 20 MG tablet Take 1 tablet (20 mg total) by mouth 3 (three) times daily before meals. 90 tablet 2  . MAGNESIUM PO Take 1 tablet by mouth daily.    . potassium chloride SA (K-DUR) 20 MEQ tablet Take 40 meq PO every morning. Take 20 Meq PO every night at bedtime (Patient taking differently: Take 20-40 mEq by mouth See admin instructions. Take 2 tablets by mouth every morning and Take 1 tablet every night at bedtime) 42 tablet 0  . cholestyramine (QUESTRAN) 4 g packet Take 1 packet (4 g total) by mouth 3 (three) times daily. (Patient not taking: Reported on 01/26/2019) 60 each 12  . ondansetron (ZOFRAN) 4 MG tablet Take 1 tablet (4 mg total) by mouth every 8 (eight) hours as needed for nausea or vomiting. (Patient not taking: Reported on 01/26/2019) 30 tablet 0  . ursodiol (ACTIGALL) 300 MG capsule Take 1 capsule (300 mg total) by mouth daily. (Patient not taking: Reported on 01/26/2019) 30 capsule 0    Objective: BP (!) 109/53 (BP Location: Left Arm)   Pulse 64   Temp 98.4 F (36.9 C) (Oral)   Resp 18   Ht 5' (1.524 m)   Wt 39.7 kg   LMP 01/19/2019 (Approximate)   SpO2 100%   BMI 17.09 kg/m   Physical Exam Constitutional:      Appearance: Normal appearance.     Comments: thin  HENT:     Head: Normocephalic and atraumatic.     Mouth/Throat:     Mouth: Mucous membranes are moist.  Eyes:     Extraocular Movements: Extraocular movements intact.     Pupils: Pupils are equal, round, and reactive to light.  Neck:     Musculoskeletal: Normal range of motion and neck supple.  Cardiovascular:     Rate and Rhythm: Normal rate and regular rhythm.     Heart sounds: No murmur. No friction rub. No gallop.    Pulmonary:     Effort: Pulmonary effort is normal.     Breath sounds: Normal breath sounds. No wheezing, rhonchi or rales.  Abdominal:     General: Abdomen is flat. Bowel sounds are normal. There  is no distension.     Palpations: Abdomen is soft.     Tenderness: There is no abdominal tenderness. There is no guarding.  Musculoskeletal:     Right lower leg: No edema.     Left lower leg: No edema.  Skin:    General: Skin is warm and dry.  Neurological:     General: No focal deficit present.     Mental Status: She is alert and oriented to person, place, and time.     Cranial Nerves: No cranial nerve deficit.     Sensory: No sensory deficit.     Motor: No weakness.  Psychiatric:        Mood and Affect: Mood normal.     Comments: Did get tearful when she found out that she needed nose swab for COVID admission testing      Labs and Imaging: CBC BMET  Recent Labs  Lab 01/27/19 0106  WBC 7.3  HGB 11.2*  HCT 32.7*  PLT 222   Recent Labs  Lab 01/27/19 0106  NA 138  K 3.0*  CL 114*  CO2 17*  BUN <5*  CREATININE 0.44  GLUCOSE 121*  CALCIUM 8.5*     No results found.  Marthenia RollingBland, Sarahi Borland, DO 01/27/2019, 6:17 AM PGY-1, [facility] FPTS Intern pager: [phone], text pages welcome   FPTS Upper-Level Resident Addendum   I have independently interviewed and examined the patient. I have discussed the above with the original author and agree with their documentation. My edits for correction/addition/clarification are in blue. Please see also any attending notes.    Marthenia RollingScott Meleni Delahunt, DO PGY-2, [facility] 01/27/2019 6:17 AM  FPTS Service pager: [phone] (text pages welcome through [facility])

## 2019-01-27 DIAGNOSIS — Z5989 Other problems related to housing and economic circumstances: Secondary | ICD-10-CM | POA: Insufficient documentation

## 2019-01-27 DIAGNOSIS — E876 Hypokalemia: Principal | ICD-10-CM

## 2019-01-27 DIAGNOSIS — R531 Weakness: Secondary | ICD-10-CM

## 2019-01-27 DIAGNOSIS — Z598 Other problems related to housing and economic circumstances: Secondary | ICD-10-CM

## 2019-01-27 DIAGNOSIS — K529 Noninfective gastroenteritis and colitis, unspecified: Secondary | ICD-10-CM

## 2019-01-27 LAB — BASIC METABOLIC PANEL
Anion gap: 8 (ref 5–15)
BUN: <5 mg/dL — ABNORMAL LOW (ref 6–20)
CO2: 15 mmol/L — ABNORMAL LOW (ref 22–32)
Calcium: 8.8 mg/dL — ABNORMAL LOW (ref 8.9–10.3)
Chloride: 116 mmol/L — ABNORMAL HIGH (ref 98–111)
Creatinine, Ser: 0.41 mg/dL — ABNORMAL LOW (ref 0.44–1.00)
GFR calc Af Amer: >60 mL/min (ref >60)
GFR calc non Af Amer: >60 mL/min (ref >60)
Glucose, Bld: 89 mg/dL (ref 70–99)
Potassium: 4.6 mmol/L (ref 3.5–5.1)
Sodium: 139 mmol/L (ref 135–145)

## 2019-01-27 LAB — CBC
HCT: 32.7 % — ABNORMAL LOW (ref 36.0–46.0)
Hemoglobin: 11.2 g/dL — ABNORMAL LOW (ref 12.0–15.0)
MCH: 28.5 pg (ref 26.0–34.0)
MCHC: 34.3 g/dL (ref 30.0–36.0)
MCV: 83.2 fL (ref 80.0–100.0)
Platelets: 222 10*3/uL (ref 150–400)
RBC: 3.93 MIL/uL (ref 3.87–5.11)
RDW: 15.2 % (ref 11.5–15.5)
WBC: 7.3 10*3/uL (ref 4.0–10.5)
nRBC: 0 % (ref 0.0–0.2)

## 2019-01-27 LAB — COMPREHENSIVE METABOLIC PANEL
ALT: 20 U/L (ref 0–44)
AST: 17 U/L (ref 15–41)
Albumin: 3.7 g/dL (ref 3.5–5.0)
Alkaline Phosphatase: 57 U/L (ref 38–126)
Anion gap: 7 (ref 5–15)
BUN: <5 mg/dL — ABNORMAL LOW (ref 6–20)
CO2: 17 mmol/L — ABNORMAL LOW (ref 22–32)
Calcium: 8.5 mg/dL — ABNORMAL LOW (ref 8.9–10.3)
Chloride: 114 mmol/L — ABNORMAL HIGH (ref 98–111)
Creatinine, Ser: 0.44 mg/dL (ref 0.44–1.00)
GFR calc Af Amer: >60 mL/min (ref >60)
GFR calc non Af Amer: >60 mL/min (ref >60)
Glucose, Bld: 121 mg/dL — ABNORMAL HIGH (ref 70–99)
Potassium: 3 mmol/L — ABNORMAL LOW (ref 3.5–5.1)
Sodium: 138 mmol/L (ref 135–145)
Total Bilirubin: 1.2 mg/dL (ref 0.3–1.2)
Total Protein: 6.6 g/dL (ref 6.5–8.1)

## 2019-01-27 LAB — RAPID URINE DRUG SCREEN, HOSP PERFORMED
Amphetamines: NOT DETECTED
Barbiturates: NOT DETECTED
Benzodiazepines: NOT DETECTED
Cocaine: NOT DETECTED
Opiates: NOT DETECTED
Tetrahydrocannabinol: POSITIVE — AB

## 2019-01-27 LAB — MAGNESIUM: Magnesium: 2 mg/dL (ref 1.7–2.4)

## 2019-01-27 LAB — SARS CORONAVIRUS 2: SARS Coronavirus 2: NOT DETECTED

## 2019-01-27 MED ORDER — CHOLESTYRAMINE 4 G PO PACK: 4.0000 g | PACK | Freq: Three times a day (TID) | ORAL | 12 refills | Status: DC

## 2019-01-27 MED ORDER — URSODIOL 300 MG PO CAPS: 300.0000 mg | ORAL_CAPSULE | Freq: Every day | ORAL | 0 refills | Status: AC

## 2019-01-27 MED ORDER — ADULT MULTIVITAMIN W/MINERALS CH: 1.0000 | ORAL_TABLET | Freq: Every day | ORAL | Status: DC

## 2019-01-27 MED ORDER — ENSURE ENLIVE PO LIQD
237.0000 mL | Freq: Three times a day (TID) | ORAL | Status: DC
  Administered 2019-01-27: 237 mL via ORAL

## 2019-01-27 MED ORDER — POTASSIUM CHLORIDE CRYS ER 20 MEQ PO TBCR: 20.0000 meq | EXTENDED_RELEASE_TABLET | ORAL | Status: DC

## 2019-01-27 MED ORDER — ADULT MULTIVITAMIN W/MINERALS CH: 1.0000 | ORAL_TABLET | Freq: Every day | ORAL | 0 refills | Status: DC

## 2019-01-27 MED ORDER — CREON 24000-76000 UNITS PO CPEP: 1.0000 | ORAL_CAPSULE | Freq: Three times a day (TID) | ORAL | 0 refills | Status: DC

## 2019-01-27 MED ORDER — POTASSIUM CHLORIDE CRYS ER 20 MEQ PO TBCR: 20.0000 meq | EXTENDED_RELEASE_TABLET | Freq: Every day | ORAL | Status: DC

## 2019-01-27 MED ORDER — ENSURE ENLIVE PO LIQD: 237.0000 mL | Freq: Three times a day (TID) | ORAL | 12 refills | Status: DC

## 2019-01-27 MED ORDER — POTASSIUM CHLORIDE CRYS ER 20 MEQ PO TBCR: 40.0000 meq | EXTENDED_RELEASE_TABLET | Freq: Every day | ORAL | Status: DC

## 2019-01-27 MED ORDER — POTASSIUM CHLORIDE CRYS ER 20 MEQ PO TBCR: 40.0000 meq | EXTENDED_RELEASE_TABLET | Freq: Every day | ORAL | 0 refills | Status: DC

## 2019-01-27 MED ORDER — DICYCLOMINE HCL 20 MG PO TABS
20.0000 mg | ORAL_TABLET | Freq: Once | ORAL | Status: AC
  Administered 2019-01-27: 20 mg via ORAL
  Filled 2019-01-27: qty 1

## 2019-01-27 MED ORDER — POTASSIUM CHLORIDE 10 MEQ/100ML IV SOLN
10.0000 meq | INTRAVENOUS | Status: AC
  Administered 2019-01-27 (×3): 10 meq via INTRAVENOUS
  Filled 2019-01-27 (×3): qty 100

## 2019-01-27 MED FILL — CHOLESTYRAMINE PACKET: 4 | 20 days supply | Qty: 60 | Fill #0

## 2019-01-27 MED FILL — CREON DR 12,000 UNITS CAP: 12000 | 15 days supply | Qty: 90 | Fill #0

## 2019-01-27 MED FILL — POTASSIUM CL ER 20 MEQ TABL: 20 | 30 days supply | Qty: 90 | Fill #0

## 2019-01-27 MED FILL — URSODIOL 300 MG CAPS: 300 | 30 days supply | Qty: 30 | Fill #0

## 2019-01-27 NOTE — Progress Notes (Signed)
Family Medicine Teaching Service Daily Progress Note Intern Pager: [phone]  Patient name: Sara Hill Medical record number: [id number] Date of birth: 1990-09-29 Age: 28 y.o. Gender: female  Primary Care Provider: Scot Jun, FNP Consultants: none Code Status: Full  Pt Overview and Major Events to Date:  6/16-admit for hypokalemia  Assessment and Plan: Sara Hill is a 28 y.o. female presenting with hypokalemia and generalized weakness. PMH is significant for chronic diarrhea, chronic hypokalemia, s/p cholecystectomy in 2014, protein calorie malnutrition.  Acute on chronic hypokalemia Secondary to cholecystectomy and malabsorption.  Vitals within normal limits overnight.  BMP collected at 1 AM shows potassium 3.0.  Last potassium supplement given at 2:30 AM.  So far, 160 mEq of p.o. potassium have been given in addition to 5 runs of 10 mEq.  EKG from admission remarkable for T wave inversions in V1, V2 in addition to prominent U waves.  EKG taken at 12:30 AM shows resolution of T wave inversions and U waves. -Consider GI consult -Consider creon -Follow-up potassium   Malabsorption, chronic/protein calorie malnutrition BMI 17. -Nutrition consult  Anemia Hemoglobin this morning 11.2 down from 14.5 on admission.  Likely secondary to dilution. -Monitor CBC  History of hyperglycemia -Follow-up A1c  THC use -Follow-up UDS  Uninsured -Consult social work  FEN/GI: regular diet PPx: lovenox  Disposition: possible DC 6/17  Subjective:  No acute events overnight.  She continues to note upper quadrant abdominal pain is been present for many years.  Abdominal pain does not appear to be associated with bowel movements or nausea/vomiting.  She also noted some right upper extremity pain that she believed was related to the IV.  Objective: Temp:  [98.1 F (36.7 C)-98.5 F (36.9 C)] 98.1 F (36.7 C) (06/17 0629) Pulse Rate:  [57-80] 62 (06/17 0629) Resp:   [13-24] 15 (06/17 0629) BP: (96-116)/(53-84) 105/71 (06/17 0629) SpO2:  [99 %-100 %] 100 % (06/17 0629) Weight:  [39.7 kg] 39.7 kg (06/16 2300) Physical Exam: General: Alert and cooperative and appears to be in no acute distress.  Resting in bed slightly curled up holding her right arm.  Gaunt appearing, low muscle mass. Cardio: Normal S1 and S2, no S3 or S4. Rhythm is regular. No murmurs or rubs.   Pulm: Clear to auscultation bilaterally, no crackles, wheezing, or diminished breath sounds. Normal respiratory effort Abdomen: Bowel sounds normal. Abdomen soft and mildly tender to palpation in the upper quadrants. Extremities: No peripheral edema. Warm/ well perfused.  Strong radial pulse. Neuro: Cranial nerves grossly intact   Laboratory: Recent Labs  Lab 01/26/19 1553 01/27/19 0106  WBC 7.8 7.3  HGB 14.5 11.2*  HCT 42.3 32.7*  PLT 277 222   Recent Labs  Lab 01/26/19 1553 01/26/19 2059 01/27/19 0106  NA 140  --  138  K 2.4* 2.4* 3.0*  CL 106  --  114*  CO2 24  --  17*  BUN <5*  --  <5*  CREATININE 0.51  --  0.44  CALCIUM 9.6  --  8.5*  PROT  --   --  6.6  BILITOT  --   --  1.2  ALKPHOS  --   --  57  ALT  --   --  20  AST  --   --  17  GLUCOSE 104*  --  121*   Imaging/Diagnostic Tests: No results found.   Matilde Haymaker, MD 01/27/2019, 6:35 AM PGY-1, [place] Intern pager: [phone], text pages welcome

## 2019-01-27 NOTE — Progress Notes (Signed)
Sara Hill to be discharged home per MD order. Discussed prescriptions and follow up appointments with the patient. Prescriptions given to patient; medication list explained in detail. Patient verbalized understanding.  Skin clean, dry and intact without evidence of skin break down, no evidence of skin tears noted. IV catheter discontinued intact. Site without signs and symptoms of complications. Dressing and pressure applied. Pt denies pain at the site currently. No complaints noted.  Patient free of lines, drains, and wounds.   An After Visit Summary (AVS) was printed and given to the patient. Patient escorted via wheelchair, and discharged home via private auto.  Shela Commons, RN

## 2019-01-27 NOTE — Significant Event (Signed)
For 6/16 at 1900 to 6/17 at 0700, please use pager [phone].  Pager [phone] is not working.  [place] Constable, D.O.  PGY-1 Family Medicine  01/27/2019 12:01 AM

## 2019-01-27 NOTE — Discharge Instructions (Signed)
Your hospitalized due to low potassium which is related to your chronic medical issues.  While you were here, we gave you intravenous potassium as well as oral potassium and brought your potassium back to normal levels.  It would be important to continue following up with your primary care physician every other week for frequent blood work to make sure your potassium is in an appropriate range.  It will also be important for you to follow-up with a gastrointestinal doctor (scheduled for 6/24) for further evaluation of this ongoing stomach pain, diarrhea and low potassium.  We were able to send her home with 1 months worth of medication which included nutritional supplements that were recommended by nutrition while you are here.

## 2019-01-27 NOTE — Plan of Care (Signed)
  Problem: Activity: Goal: Risk for activity intolerance will decrease Outcome: Progressing   

## 2019-01-27 NOTE — Progress Notes (Signed)
For 6/17 0700 to 1900, please page [phone].  Pager [phone] is not working. Brad Rodgers Likes, MD, MS FAMILY MEDICINE RESIDENT - PGY2 01/27/2019 2:15 PM 

## 2019-01-27 NOTE — Progress Notes (Signed)
New Admission Note:  Arrival Method: Via stretcher from ED Mental Orientation: Alert & Oriented x4 Telemetry: CCMD verified Assessment: Completed Skin: Refer to flowsheet IV: Right AC Pain: 10/10 Safety Measures: Safety Fall Prevention Plan discussed with patient. Admission: Completed [facility] [place] Orientation: Patient has been orientated to the room, unit and the staff.  Orders have been reviewed and are being implemented. Will continue to monitor the patient. Call light has been placed within reach and bed alarm has been activated.   Vassie Moselle, RN  Phone Number: [phone]

## 2019-01-27 NOTE — TOC Transition Note (Signed)
Transition of Care [facility]) - CM/SW Discharge Note   Patient Details  Name: Sara Hill MRN: [id number] Date of Birth: [birth year]  Transition of Care [facility]) CM/SW Contact:  Bartholomew Crews, RN Phone Number: [phone] 01/27/2019, 3:13 PM   Clinical Narrative:    Spoke with patient at the bedside. Discussed medications sent to [facility] - patient not able to afford medications at this time - Match provided with copay override. Patient aware that ensure and MVI not covered. Patient states that she has ensure at home, and will get MVI next week. Discussed working with pharmacy at [facility] and [facility] for future prescriptions and refills.   Patient waiting to get medications, then will call ride.   Discussed PCP f/u. States that she sees [name] at [facility] at [place], and stays connected via My Chart. Verbalized already having a f/u appointment scheduled.   No other transition of care needs identified.    Final next level of care: Home/Self Care Barriers to Discharge: No Barriers Identified   Patient Goals and CMS Choice        Discharge Placement                       Discharge Plan and Services                DME Arranged: N/A DME Agency: NA       HH Arranged: NA HH Agency: NA        Social Determinants of Health (SDOH) Interventions     Readmission Risk Interventions No flowsheet data found.

## 2019-01-27 NOTE — Progress Notes (Signed)
Patient was receiving d/c medications and stated she was cramping.  Advised patient I could page Physician and ask to get her something and she stated "That's why I hate this hospital" and walked out.  Told her to hold on and let me get her a wheelchair and take her out but she just walked off and got on the elevator.

## 2019-01-27 NOTE — Progress Notes (Signed)
Initial Nutrition Assessment  DOCUMENTATION CODES:   Underweight(Suspect pt remains malnourished)  INTERVENTION:    Ensure Enlive po TID, each supplement provides 350 kcal and 20 grams of protein- strawberry  MVI daily   NUTRITION DIAGNOSIS:   Inadequate oral intake related to poor appetite as evidenced by per patient/family report.  GOAL:   Patient will meet greater than or equal to 90% of their needs  MONITOR:   PO intake, Supplement acceptance, Weight trends, Labs, I & O's  REASON FOR ASSESSMENT:   Consult Assessment of nutrition requirement/status  ASSESSMENT:   Patient with PMH significant for chronic diarrhea, chronic hypokalemia, PCM, and s/p cholecystectomy. Presents this admission with acute on chronic hypokalemia.   RD working remotely.  Spoke with pt via phone. Complains of abdominal pain this afternoon. Reports PTA she noticed a significant decline in appetite over the last week. She typically eats 1-2 smaller meals that consist of meat and grains but could only tolerate liquids during this time. She tries to consume 3 Ensure daily. She is eager to eat this admission. No meal completions charted. Discussed the importance of protein intake for preservation of lean body mass. RD to provide Ensure.   Pt endorses a UBW of 85-95 lb and is unsure if she lost weight PTA. States her weight never exceeds 100 lb. Records indicate pt weighed 86 lb during her last admission and 87 lb this admission. Suspect pt remains chronically malnourished but unable to diagnosis without physical exam.   Drips: NS @ 50 ml/hr Medications: 40 mEq KCl daily Labs: reviewed   Diet Order:   Diet Order            Diet regular Room service appropriate? Yes; Fluid consistency: Thin  Diet effective now              EDUCATION NEEDS:   Education needs have been addressed  Skin:  Skin Assessment: Reviewed RN Assessment  Last BM:  6/16  Height:   Ht Readings from Last 1  Encounters:  01/26/19 5' (1.524 m)    Weight:   Wt Readings from Last 1 Encounters:  01/26/19 39.7 kg    Ideal Body Weight:  45.5 kg  BMI:  Body mass index is 17.09 kg/m.  Estimated Nutritional Needs:   Kcal:  1300-1500 kcal  Protein:  65-80 grams  Fluid:  >/= 1.3 L/day   Mariana Single RD, LDN Clinical Nutrition Pager # - [phone]

## 2019-01-27 NOTE — Discharge Summary (Signed)
[facility] Discharge Summary  Patient name: Sara Hill Medical record number: [id number] Date of birth: 1991-07-28 Age: 28 y.o. Gender: female Date of Admission: 01/26/2019  Date of Discharge: 01/27/2019 Admitting Physician: Zenia Resides, MD  Primary Care Provider: Scot Jun, FNP Consultants: none  Indication for Hospitalization: hypokalemia  Discharge Diagnoses/Problem List:  Acute on chronic hypokalemia Malabsorption, chronic/protein calorie malnutrition Anemia History of hyperglycemia THC use Uninsured  Disposition: Discharge home  Discharge Condition: Stable  Discharge Exam:  General: Alert and cooperative and appears to be in no acute distress.  Resting in bed slightly curled up holding her right arm.  Gaunt appearing, low muscle mass. Cardio: Normal S1 and S2, no S3 or S4. Rhythm is regular. No murmurs or rubs.   Pulm: Clear to auscultation bilaterally, no crackles, wheezing, or diminished breath sounds. Normal respiratory effort Abdomen: Bowel sounds normal. Abdomen soft and mildly tender to palpation in the upper quadrants. Extremities: No peripheral edema. Warm/ well perfused.  Strong radial pulse. Neuro: Cranial nerves grossly intact  Brief Hospital Course:  Sara Blanchardis a 28 y.o.femalepresenting with hypokalemiaand generalized weakness. PMH is significant forchronic diarrhea, chronic hypokalemia, s/p cholecystectomy in 2014, protein calorie malnutrition.  Hypokalemia, acute on chronic She has a history of chronic hypokalemia which sounds to be secondary to cholecystectomy and malabsorption issues.  She was found to be hypokalemic to 2.4 during a routine clinic visit and was advised to come to the ED for IV repletion.  She was admitted to the hospital the evening of 6/16 and was given IV and p.o. potassium repletion.  On the morning of 6/17 she was found to have a potassium of 4.6.  She was discharged home  on her home potassium regimen (40 mEq in the morning, 20 mEq in the evening).  She has close follow-up with her primary care physician.  Chronic diarrhea, malabsorption This appears to be a chronic issue which is received significant attention during previous hospitalizations.  She appears to have difficulty affording medications outpatient.  Prior to discharge, she was given a one-month supply of cholestyramine, ursodiol and Creon.  Follow-up with GI was arranged for further assessment and treatment.  Low muscle mass She is found to have a BMI of 17 which is likely related to the above issue of malabsorption.  She is not appear to have any significant unintentional weight loss although she does seem to have trouble gaining weight.  Nutrition was consulted and recommended supplements with meals.  Due to her low BMI and involvement in the LGBT community, HIV and RPR were collected.  Please follow-up with these results.  Issues for Follow Up:  1.  Follow-up HIV, RPR. 2.  BMP to follow-up potassium.  Adjust daily potassium accordingly. 3.  On discharge, she was given a one-month supply of cholestyramine, ursodiol, Creon.  Please assess if these have helped her GI symptoms. 4.  Please ensure follow-up with GI for further assessment and treatment of her chronic abdominal pain. 5.  On discharge, she was given a one-month supply of nutritional supplements.  Please continue to assess weight and potential malnutrition.  Significant Procedures: none  Significant Labs and Imaging:  Recent Labs  Lab 01/26/19 1553 01/27/19 0106  WBC 7.8 7.3  HGB 14.5 11.2*  HCT 42.3 32.7*  PLT 277 222   Recent Labs  Lab 01/26/19 1553 01/26/19 1557  01/27/19 0106 01/27/19 0619 01/27/19 1003  NA 140  --   --  138  --  139  K 2.4*  --    < > 3.0*  --  4.6  CL 106  --   --  114*  --  116*  CO2 24  --   --  17*  --  15*  GLUCOSE 104*  --   --  121*  --  89  BUN <5*  --   --  <5*  --  <5*  CREATININE 0.51  --    --  0.44  --  0.41*  CALCIUM 9.6  --   --  8.5*  --  8.8*  MG  --  1.9  --   --  2.0  --   ALKPHOS  --   --   --  57  --   --   AST  --   --   --  17  --   --   ALT  --   --   --  20  --   --   ALBUMIN  --   --   --  3.7  --   --    < > = values in this interval not displayed.    No results found.   Results/Tests Pending at Time of Discharge:  HIV, RPR  Discharge Medications:  Allergies as of 01/27/2019      Reactions   Benadryl [diphenhydramine Hcl] Itching, Swelling   Pt reeports "feels like throat swelling".      Medication List    TAKE these medications   cholestyramine 4 g packet Commonly known as: Questran Take 1 packet (4 g total) by mouth 3 (three) times daily.   Creon 24000-76000 units Cpep Generic drug: Pancrelipase (Lip-Prot-Amyl) Take 1 capsule (24,000 Units total) by mouth 3 (three) times daily with meals.   dicyclomine 20 MG tablet Commonly known as: Bentyl Take 1 tablet (20 mg total) by mouth 3 (three) times daily before meals.   feeding supplement (ENSURE ENLIVE) Liqd Take 237 mLs by mouth 3 (three) times daily between meals.   MAGNESIUM PO Take 1 tablet by mouth daily.   multivitamin with minerals Tabs tablet Take 1 tablet by mouth daily.   ondansetron 4 MG tablet Commonly known as: Zofran Take 1 tablet (4 mg total) by mouth every 8 (eight) hours as needed for nausea or vomiting.   potassium chloride SA 20 MEQ tablet Commonly known as: K-DUR Take 2 tablets (40 mEq total) by mouth daily. Take 2 tablets in the morning and one tablet at night. Start taking on: January 28, 2019 What changed:   how much to take  how to take this  when to take this  additional instructions   ursodiol 300 MG capsule Commonly known as: ACTIGALL Take 1 capsule (300 mg total) by mouth daily for 30 days.       Discharge Instructions: Please refer to Patient Instructions section of EMR for full details.  Patient was counseled important signs and symptoms that  should prompt return to medical care, changes in medications, dietary instructions, activity restrictions, and follow up appointments.   Follow-Up Appointments: Follow-up Information    Meredith PelGuenther, Paula M, NP. Go on 02/03/2019.   Specialty: Gastroenterology Why: @11AM  (on third floor) Contact information: [address] [place] [address] [phone]           Mirian MoFrank, Leo Fray, MD 01/27/2019, 2:42 PM PGY-1, [facility]

## 2019-01-28 LAB — HEMOGLOBIN A1C
Hgb A1c MFr Bld: 4.9 % (ref 4.8–5.6)
Mean Plasma Glucose: 94 mg/dL

## 2019-01-28 LAB — RPR: RPR Ser Ql: NONREACTIVE

## 2019-01-28 LAB — HIV ANTIBODY (ROUTINE TESTING W REFLEX): HIV Screen 4th Generation wRfx: NONREACTIVE

## 2019-02-02 ENCOUNTER — Other Ambulatory Visit: Payer: Self-pay

## 2019-02-03 ENCOUNTER — Ambulatory Visit: Payer: Self-pay | Admitting: Nurse Practitioner

## 2019-06-04 ENCOUNTER — Other Ambulatory Visit: Payer: Self-pay

## 2019-06-04 ENCOUNTER — Encounter (HOSPITAL_COMMUNITY): Payer: Self-pay

## 2019-06-04 ENCOUNTER — Inpatient Hospital Stay (HOSPITAL_COMMUNITY)
Admission: EM | Admit: 2019-06-04 | Discharge: 2019-06-08 | DRG: 641 | Disposition: A | Discharge status: Home / Self Care | Payer: Self-pay | Attending: Internal Medicine | Admitting: Internal Medicine

## 2019-06-04 DIAGNOSIS — Z681 Body mass index (BMI) 19 or less, adult: Secondary | ICD-10-CM

## 2019-06-04 DIAGNOSIS — Z91128 Patient's intentional underdosing of medication regimen for other reason: Secondary | ICD-10-CM

## 2019-06-04 DIAGNOSIS — K529 Noninfective gastroenteritis and colitis, unspecified: Secondary | ICD-10-CM | POA: Diagnosis present

## 2019-06-04 DIAGNOSIS — R531 Weakness: Secondary | ICD-10-CM

## 2019-06-04 DIAGNOSIS — K909 Intestinal malabsorption, unspecified: Secondary | ICD-10-CM | POA: Diagnosis present

## 2019-06-04 DIAGNOSIS — F1290 Cannabis use, unspecified, uncomplicated: Secondary | ICD-10-CM | POA: Diagnosis present

## 2019-06-04 DIAGNOSIS — G8929 Other chronic pain: Secondary | ICD-10-CM | POA: Diagnosis present

## 2019-06-04 DIAGNOSIS — E46 Unspecified protein-calorie malnutrition: Secondary | ICD-10-CM | POA: Diagnosis present

## 2019-06-04 DIAGNOSIS — Z20828 Contact with and (suspected) exposure to other viral communicable diseases: Secondary | ICD-10-CM | POA: Diagnosis present

## 2019-06-04 DIAGNOSIS — Z833 Family history of diabetes mellitus: Secondary | ICD-10-CM

## 2019-06-04 DIAGNOSIS — R7989 Other specified abnormal findings of blood chemistry: Secondary | ICD-10-CM

## 2019-06-04 DIAGNOSIS — E872 Acidosis: Secondary | ICD-10-CM | POA: Diagnosis present

## 2019-06-04 DIAGNOSIS — D649 Anemia, unspecified: Secondary | ICD-10-CM | POA: Diagnosis present

## 2019-06-04 DIAGNOSIS — T503X6A Underdosing of electrolytic, caloric and water-balance agents, initial encounter: Secondary | ICD-10-CM | POA: Diagnosis present

## 2019-06-04 DIAGNOSIS — E876 Hypokalemia: Principal | ICD-10-CM | POA: Diagnosis present

## 2019-06-04 DIAGNOSIS — R945 Abnormal results of liver function studies: Secondary | ICD-10-CM

## 2019-06-04 DIAGNOSIS — Z9049 Acquired absence of other specified parts of digestive tract: Secondary | ICD-10-CM

## 2019-06-04 LAB — URINALYSIS, ROUTINE W REFLEX MICROSCOPIC
Bacteria, UA: NONE SEEN
Bilirubin Urine: NEGATIVE
Glucose, UA: NEGATIVE mg/dL
Ketones, ur: 5 mg/dL — AB
Leukocytes,Ua: NEGATIVE
Nitrite: NEGATIVE
Protein, ur: NEGATIVE mg/dL
Specific Gravity, Urine: 1.004 — ABNORMAL LOW (ref 1.005–1.030)
pH: 8 (ref 5.0–8.0)

## 2019-06-04 LAB — CBC
HCT: 37 % (ref 36.0–46.0)
Hemoglobin: 12.8 g/dL (ref 12.0–15.0)
MCH: 29 pg (ref 26.0–34.0)
MCHC: 34.6 g/dL (ref 30.0–36.0)
MCV: 83.7 fL (ref 80.0–100.0)
Platelets: 249 10*3/uL (ref 150–400)
RBC: 4.42 MIL/uL (ref 3.87–5.11)
RDW: 14.9 % (ref 11.5–15.5)
WBC: 6.8 10*3/uL (ref 4.0–10.5)
nRBC: 0 % (ref 0.0–0.2)

## 2019-06-04 LAB — COMPREHENSIVE METABOLIC PANEL
ALT: 40 U/L (ref 0–44)
AST: 27 U/L (ref 15–41)
Albumin: 4.4 g/dL (ref 3.5–5.0)
Alkaline Phosphatase: 70 U/L (ref 38–126)
Anion gap: 11 (ref 5–15)
BUN: 5 mg/dL — ABNORMAL LOW (ref 6–20)
CO2: 19 mmol/L — ABNORMAL LOW (ref 22–32)
Calcium: 8.7 mg/dL — ABNORMAL LOW (ref 8.9–10.3)
Chloride: 110 mmol/L (ref 98–111)
Creatinine, Ser: 0.38 mg/dL — ABNORMAL LOW (ref 0.44–1.00)
GFR calc Af Amer: >60 mL/min (ref >60)
GFR calc non Af Amer: >60 mL/min (ref >60)
Glucose, Bld: 91 mg/dL (ref 70–99)
Potassium: 2 mmol/L — CL (ref 3.5–5.1)
Sodium: 140 mmol/L (ref 135–145)
Total Bilirubin: 1.5 mg/dL — ABNORMAL HIGH (ref 0.3–1.2)
Total Protein: 8 g/dL (ref 6.5–8.1)

## 2019-06-04 LAB — I-STAT BETA HCG BLOOD, ED (MC, WL, AP ONLY): I-stat hCG, quantitative: <5 m[IU]/mL (ref <5)

## 2019-06-04 LAB — RAPID URINE DRUG SCREEN, HOSP PERFORMED
Amphetamines: NOT DETECTED
Barbiturates: NOT DETECTED
Benzodiazepines: NOT DETECTED
Cocaine: NOT DETECTED
Opiates: NOT DETECTED
Tetrahydrocannabinol: POSITIVE — AB

## 2019-06-04 LAB — AMYLASE: Amylase: 25 U/L — ABNORMAL LOW (ref 28–100)

## 2019-06-04 LAB — MAGNESIUM: Magnesium: 1.7 mg/dL (ref 1.7–2.4)

## 2019-06-04 MED ORDER — MORPHINE SULFATE (PF) 4 MG/ML IV SOLN
4.0000 mg | Freq: Once | INTRAVENOUS | Status: AC
  Administered 2019-06-04: 16:00:00 4 mg via INTRAVENOUS
  Filled 2019-06-04: qty 1

## 2019-06-04 MED ORDER — POTASSIUM CHLORIDE CRYS ER 20 MEQ PO TBCR
40.0000 meq | EXTENDED_RELEASE_TABLET | Freq: Once | ORAL | Status: DC
  Filled 2019-06-04: qty 2

## 2019-06-04 MED ORDER — DICYCLOMINE HCL 10 MG PO CAPS
10.0000 mg | ORAL_CAPSULE | Freq: Once | ORAL | Status: AC
  Administered 2019-06-04: 14:00:00 10 mg via ORAL
  Filled 2019-06-04: qty 1

## 2019-06-04 MED ORDER — SODIUM CHLORIDE 0.9 % IV BOLUS
1000.0000 mL | Freq: Once | INTRAVENOUS | Status: AC
  Administered 2019-06-04: 1000 mL via INTRAVENOUS

## 2019-06-04 MED ORDER — POTASSIUM CHLORIDE 10 MEQ/100ML IV SOLN
10.0000 meq | INTRAVENOUS | Status: AC
  Administered 2019-06-04: 21:00:00 10 meq via INTRAVENOUS
  Filled 2019-06-04: qty 100

## 2019-06-04 MED ORDER — POTASSIUM CHLORIDE 10 MEQ/100ML IV SOLN
10.0000 meq | INTRAVENOUS | Status: AC
  Administered 2019-06-04 (×2): 10 meq via INTRAVENOUS
  Filled 2019-06-04 (×2): qty 100

## 2019-06-04 MED ORDER — MORPHINE SULFATE (PF) 2 MG/ML IV SOLN
2.0000 mg | Freq: Once | INTRAVENOUS | Status: DC
  Filled 2019-06-04: qty 1

## 2019-06-04 MED ORDER — ONDANSETRON HCL 4 MG/2ML IJ SOLN
4.0000 mg | Freq: Three times a day (TID) | INTRAMUSCULAR | Status: DC
  Administered 2019-06-04 – 2019-06-08 (×11): 4 mg via INTRAVENOUS
  Filled 2019-06-04 (×11): qty 2

## 2019-06-04 MED ORDER — ONDANSETRON HCL 4 MG/2ML IJ SOLN
4.0000 mg | Freq: Once | INTRAMUSCULAR | Status: AC
  Administered 2019-06-04: 14:00:00 4 mg via INTRAVENOUS
  Filled 2019-06-04: qty 2

## 2019-06-04 MED ORDER — MAGNESIUM SULFATE 50 % IJ SOLN: 1.0000 g | Freq: Once | INTRAMUSCULAR | Status: DC

## 2019-06-04 MED ORDER — CYCLOBENZAPRINE HCL 10 MG PO TABS
5.0000 mg | ORAL_TABLET | Freq: Once | ORAL | Status: AC
  Administered 2019-06-04: 16:00:00 5 mg via ORAL
  Filled 2019-06-04: qty 1

## 2019-06-04 MED ORDER — POTASSIUM CHLORIDE IN NACL 40-0.9 MEQ/L-% IV SOLN
INTRAVENOUS | Status: DC
  Administered 2019-06-04: 125 mL/h via INTRAVENOUS
  Administered 2019-06-05: 75 mL/h via INTRAVENOUS
  Filled 2019-06-04 (×3): qty 1000

## 2019-06-04 MED ORDER — HYDROMORPHONE HCL 1 MG/ML IJ SOLN
1.0000 mg | Freq: Once | INTRAMUSCULAR | Status: AC
  Administered 2019-06-04: 1 mg via INTRAVENOUS
  Filled 2019-06-04: qty 1

## 2019-06-04 MED ORDER — MAGNESIUM SULFATE IN D5W 1-5 GM/100ML-% IV SOLN
1.0000 g | Freq: Once | INTRAVENOUS | Status: AC
  Administered 2019-06-04: 17:00:00 1 g via INTRAVENOUS
  Filled 2019-06-04: qty 100

## 2019-06-04 MED ORDER — POTASSIUM CHLORIDE CRYS ER 20 MEQ PO TBCR
40.0000 meq | EXTENDED_RELEASE_TABLET | Freq: Once | ORAL | Status: AC
  Administered 2019-06-04: 15:00:00 40 meq via ORAL
  Filled 2019-06-04: qty 2

## 2019-06-04 MED ORDER — HYDROMORPHONE HCL 1 MG/ML IJ SOLN
0.5000 mg | INTRAMUSCULAR | Status: DC | PRN
  Administered 2019-06-05: 0.5 mg via INTRAVENOUS
  Filled 2019-06-04: qty 0.5

## 2019-06-04 MED ORDER — ENOXAPARIN SODIUM 30 MG/0.3ML ~~LOC~~ SOLN
30.0000 mg | SUBCUTANEOUS | Status: DC
  Filled 2019-06-04 (×2): qty 0.3

## 2019-06-04 MED ORDER — PANCRELIPASE (LIP-PROT-AMYL) 12000-38000 UNITS PO CPEP
24000.0000 [IU] | ORAL_CAPSULE | Freq: Three times a day (TID) | ORAL | Status: DC
  Administered 2019-06-05 – 2019-06-08 (×10): 24000 [IU] via ORAL
  Filled 2019-06-04 (×10): qty 2

## 2019-06-04 MED ORDER — DICYCLOMINE HCL 20 MG PO TABS
20.0000 mg | ORAL_TABLET | Freq: Three times a day (TID) | ORAL | Status: DC
  Administered 2019-06-05 – 2019-06-08 (×10): 20 mg via ORAL
  Filled 2019-06-04 (×12): qty 1

## 2019-06-04 MED ORDER — SODIUM CHLORIDE 0.9 % IV BOLUS
1000.0000 mL | Freq: Once | INTRAVENOUS | Status: AC
  Administered 2019-06-04: 13:00:00 1000 mL via INTRAVENOUS

## 2019-06-04 NOTE — H&P (Addendum)
History and Physical    Sara Hill FBP:[id number] DOB: 1990-08-13 DOA: 06/04/2019  PCP: Sara Jun, FNP Patient coming from: Home Chief Complaint: Abdominal pain low potassium  HPI: Sara Hill is a 28 y.o. female with medical history significant of chronic malabsorption and diarrhea since cholecystectomy in 2015.  According to her fianc patient passed out while she was a passenger in the car.  She felt dizzy and passed out briefly.  She reports these episodes similar to prior episodes when she has low potassium.  Potassium is prescribed daily but she only takes it as needed.  She has chronic abdominal pain and chronic diarrhea.  Patient has history of syncopal episodes.  Her appetite is poor and she has not eaten anything in 2 days.  She is not taking her prescribed potassium supplements.  Denies fever or chills complains of nausea.  Denies urinary complaints.  Denies cough shortness of breath chest pain.  Denies headache changes with her vision.  She had a hospital admission in 01/30/2019 for hypokalemia and generalized weakness.  ED Course: Patient received IV potassium.  Blood pressure 93/65 pulse is 78 respiration 25 temperature 9899% on room air.  Sodium 140 potassium 2.0 BUN 5 creatinine 0.38 magnesium 1.7.  WBC 6.8 hemoglobin 12.8 platelet count 249.  UA with ketones moderate amount of blood  Review of Systems: As per HPI otherwise all other systems reviewed and are negative  Ambulatory Status: She is ambulatory at baseline  Past Medical History:  Diagnosis Date  . Gall stones   . Low blood potassium     Past Surgical History:  Procedure Laterality Date  . CHOLECYSTECTOMY    . COLONOSCOPY  01/2018   normal  . ESOPHAGOGASTRODUODENOSCOPY  01/2018   normal    Social History   Socioeconomic History  . Marital status: Single    Spouse name: Not on file  . Number of children: Not on file  . Years of education: Not on file  . Highest education  level: Not on file  Occupational History  . Not on file  Social Needs  . Financial resource strain: Not on file  . Food insecurity    Worry: Not on file    Inability: Not on file  . Transportation needs    Medical: Not on file    Non-medical: Not on file  Tobacco Use  . Smoking status: Never Smoker  . Smokeless tobacco: Never Used  Substance and Sexual Activity  . Alcohol use: No  . Drug use: Yes    Types: Marijuana  . Sexual activity: Never  Lifestyle  . Physical activity    Days per week: Not on file    Minutes per session: Not on file  . Stress: Not on file  Relationships  . Social Herbalist on phone: Not on file    Gets together: Not on file    Attends religious service: Not on file    Active member of club or organization: Not on file    Attends meetings of clubs or organizations: Not on file    Relationship status: Not on file  . Intimate partner violence    Fear of current or ex partner: Not on file    Emotionally abused: Not on file    Physically abused: Not on file    Forced sexual activity: Not on file  Other Topics Concern  . Not on file  Social History Narrative   Lives at home with a friend.  Independent at baseline    Allergies  Allergen Reactions  . Benadryl [Diphenhydramine Hcl] Itching and Swelling    Pt reeports "feels like throat swelling".     Family History  Problem Relation Age of Onset  . Healthy Mother   . Diabetes Mother     Prior to Admission medications   Medication Sig Start Date End Date Taking? Authorizing Provider  dicyclomine (BENTYL) 20 MG tablet Take 1 tablet (20 mg total) by mouth 3 (three) times daily before meals. Patient taking differently: Take 20 mg by mouth 3 (three) times daily as needed for spasms.  12/22/18  Yes Bing Neighbors, FNP  feeding supplement, ENSURE ENLIVE, (ENSURE ENLIVE) LIQD Take 237 mLs by mouth 3 (three) times daily between meals. Patient taking differently: Take 237 mLs by mouth 3  (three) times daily as needed (nutrition).  01/27/19  Yes Mirian Mo, MD  MAGNESIUM PO Take 1 tablet by mouth daily as needed (low magnesium).    Yes [provider]  Pancrelipase, Lip-Prot-Amyl, (CREON) 24000-76000 units CPEP Take 1 capsule (24,000 Units total) by mouth 3 (three) times daily with meals. 01/27/19  Yes Mirian Mo, MD  potassium chloride SA (K-DUR) 20 MEQ tablet Take 2 tablets (40 mEq total) by mouth daily. Take 2 tablets in the morning and one tablet at night. Patient taking differently: Take 20 mEq by mouth 2 (two) times daily.  01/28/19  Yes Mirian Mo, MD  cholestyramine Lanetta Inch) 4 g packet Take 1 packet (4 g total) by mouth 3 (three) times daily. Patient not taking: Reported on 06/04/2019 01/27/19   Mirian Mo, MD  Multiple Vitamin (MULTIVITAMIN WITH MINERALS) TABS tablet Take 1 tablet by mouth daily. Patient not taking: Reported on 06/04/2019 01/27/19   Mirian Mo, MD  ondansetron (ZOFRAN) 4 MG tablet Take 1 tablet (4 mg total) by mouth every 8 (eight) hours as needed for nausea or vomiting. Patient not taking: Reported on 01/26/2019 12/22/18   Bing Neighbors, FNP    Physical Exam: Vitals:   06/04/19 1410 06/04/19 1433 06/04/19 1500 06/04/19 1630  BP: (!) 105/59 101/64 (!) 105/57 95/63  Pulse: 69 65 70 72  Resp: Temp:      TempSrc:      SpO2: 100% 100% 100% 99%   Chronically ill-appearing, malnourished frail young female  . General: Appears in mild distress due to abdominal pain . Eyes:  PERRL, EOMI, normal lids, iris . ENT:  grossly normal hearing, lips & tongue, mmm . Neck:  no LAD, masses or thyromegaly . Cardiovascular:  RRR, no m/r/g. No LE edema.  Marland Kitchen Respiratory: CTA bilaterally, no w/r/r. Normal respiratory effort. . Abdomen: Soft nontender nondistended good bowel sounds  . skin:  no rash or induration seen on limited exam . Musculoskeletal:  grossly normal tone BUE/BLE, good ROM, no bony abnormality . Psychiatric:  grossly  normal mood and affect, speech fluent and appropriate, AOx3 . Neurologic: CN 2-12 grossly intact, moves all extremities in coordinated fashion, sensation intact  Labs on Admission: I have personally reviewed following labs and imaging studies  CBC: Recent Labs  Lab 06/04/19 1343  WBC 6.8  HGB 12.8  HCT 37.0  MCV 83.7  PLT 249   Basic Metabolic Panel: Recent Labs  Lab 06/04/19 1343  NA 140  K 2.0*  CL 110  CO2 19*  GLUCOSE 91  BUN 5*  CREATININE 0.38*  CALCIUM 8.7*  MG 1.7   GFR: CrCl cannot be calculated (Unknown  ideal weight.). Liver Function Tests: Recent Labs  Lab 06/04/19 1343  AST 27  ALT 40  ALKPHOS 70  BILITOT 1.5*  PROT 8.0  ALBUMIN 4.4   No results for input(s): LIPASE, AMYLASE in the last 168 hours. No results for input(s): AMMONIA in the last 168 hours. Coagulation Profile: No results for input(s): INR, PROTIME in the last 168 hours. Cardiac Enzymes: No results for input(s): CKTOTAL, CKMB, CKMBINDEX, TROPONINI in the last 168 hours. BNP (last 3 results) No results for input(s): PROBNP in the last 8760 hours. HbA1C: No results for input(s): HGBA1C in the last 72 hours. CBG: No results for input(s): GLUCAP in the last 168 hours. Lipid Profile: No results for input(s): CHOL, HDL, LDLCALC, TRIG, CHOLHDL, LDLDIRECT in the last 72 hours. Thyroid Function Tests: No results for input(s): TSH, T4TOTAL, FREET4, T3FREE, THYROIDAB in the last 72 hours. Anemia Panel: No results for input(s): VITAMINB12, FOLATE, FERRITIN, TIBC, IRON, RETICCTPCT in the last 72 hours. Urine analysis:    Component Value Date/Time   COLORURINE STRAW (A) 06/04/2019 1420   APPEARANCEUR CLEAR 06/04/2019 1420   LABSPEC 1.004 (L) 06/04/2019 1420   PHURINE 8.0 06/04/2019 1420   GLUCOSEU NEGATIVE 06/04/2019 1420   HGBUR MODERATE (A) 06/04/2019 1420   BILIRUBINUR NEGATIVE 06/04/2019 1420   KETONESUR 5 (A) 06/04/2019 1420   PROTEINUR NEGATIVE 06/04/2019 1420   UROBILINOGEN 1.0  11/26/2014 1435   NITRITE NEGATIVE 06/04/2019 1420   LEUKOCYTESUR NEGATIVE 06/04/2019 1420    Creatinine Clearance: CrCl cannot be calculated (Unknown ideal weight.).  Sepsis Labs: @LABRCNTIP (procalcitonin:4,lacticidven:4) )No results found for this or any previous visit (from the past 240 hour(s)).   Radiological Exams on Admission: No results found.  EKG: Independently reviewed no acute ST-T wave changes Assessment/Plan Active Problems:   Hypokalemia    #1 severe hypokalemia in a patient with history of malabsorption and known history of hypokalemia.  She is noncompliant to her medications.  On admission her potassium is 2.0.  This is aggressively being repleted. Recheck levels after IV replacement.  Continue p.o. replacement. Admit to telemetry. Magnesium is also low replete and recheck. Check lipase She has history of THC use check urine drug screen HIV and RPR negative this year.   #2 chronic abdominal pain and chronic diarrhea patient is on Bentyl 20 mg 3 times a day at home continue.  Continue Creon Questran Zofran CT scan March 2020 shows bilateral nonobstructing renal calculi no definite acute intra-abdominal or intrapelvic abnormalities CT of the abdomen pelvis with contrast. Since her abdomen is soft and nontender I am not going to get a CT of her abdomen at this time.  #3 chronic malnutrition malabsorption consulted dietary   #4 hypotension normal saline to be continued  #5 syncope likely secondary to being orthostatic.  She is too weak to stand to check orthostatic.  However I will order.  In addition to that she has severe hypokalemia and hypomagnesemia with muscle weakness.  Severity of Illness: The appropriate patient status for this patient is INPATIENT. Inpatient status is judged to be reasonable and necessary in order to provide the required intensity of service to ensure the patient's safety. The patient's presenting symptoms, physical exam findings, and  initial radiographic and laboratory data in the context of their chronic comorbidities is felt to place them at high risk for further clinical deterioration. Furthermore, it is not anticipated that the patient will be medically stable for discharge from the hospital within 2 midnights of admission. The following factors support  the patient status of inpatient.   " The patient's presenting symptoms include nausea vomiting abdominal pain. " The worrisome physical exam findings include oral mucous membrane is dry hypotension " The initial radiographic and laboratory data are worrisome because of severe hypokalemia hypomagnesemia " The chronic co-morbidities include malabsorption syndrome   * I certify that at the point of admission it is my clinical judgment that the patient will require inpatient hospital care spanning beyond 2 midnights from the point of admission due to high intensity of service, high risk for further deterioration and high frequency of surveillance required.*  Estimated body mass index is 17.09 kg/m as calculated from the following:   Height as of 01/26/19: 5' (1.524 m).   Weight as of 01/26/19: 39.7 kg.   DVT prophylaxis: Lovenox Code Status full code Family Communication discussed with fianc in the room Disposition Plan: Pending clinical improvement Consults called: None Admission status inpatient  Alwyn Ren MD [facility]  If 7PM-7AM, please contact night-coverage [email or web address] Password [email or web address]  06/04/2019, 5:09 PM

## 2019-06-04 NOTE — ED Provider Notes (Addendum)
[facility] DEPT Provider Note   CSN: [id number] Arrival date & time: 06/04/19  1157     History   Chief Complaint Chief Complaint  Patient presents with  . Loss of Consciousness    HPI Sara Hill is a 28 y.o. female.     HPI  Patient with history of chronic malabsorption and diarrhea since cholecystectomy 2015.    Patient presents for episode of syncope that occurred prior to arrival -- she was passenger in car when she felt flushed, nauseous, dizzy and passed out briefly.   Patient states she remembers having some blurry vision and tunnel vision directly before passing out.  States she feels like her hands were cramping while she was in the car.  States that she has had similar symptoms in the past when her potassium was low.  Patient was hospitalized for low potassium 4 months ago.  States that she is prescribed oral potassium pill but takes only when she needs it instead of as prescribed which is daily.  Patient states that her diarrhea is unchanged has been constant and endorses abdominal pain which is also unchanged from her chronic abdominal pain.  Patient states that she also noted a small amount of dried blood in her mouth when she woke up yesterday morning.    Patient states she has not eaten for 2 days she has had decreased appetite which is a frequent occurrence for her.  She states she has not been drinking water occasionally drinks Gatorade.  Patient's significant other is at bedside states that she has had episodes of syncope in the past and only takes her potassium supplement 3-4 times a week.  States that patient passed out for approximately 2 minutes in the car. Per EMS report patient alert and oriented once put in trandelenburg position. CBG 106 given 381mL of NS during ambulance ride.   Denies any hemoptysis, hematochezia, unusual bowel movements, urinary symptoms.  Denies any cough, shortness of breath, chest pain, vision  loss, headache.     Past Medical History:  Diagnosis Date  . Gall stones   . Low blood potassium     Patient Active Problem List   Diagnosis Date Noted  . Chronic diarrhea   . Does not have health insurance   . Acute hypokalemia 01/26/2019  . Dehydration 11/01/2018  . Diarrhea 11/01/2018  . Abdominal pain 11/01/2018  . Weakness 11/01/2018  . Nausea and vomiting   . Severe protein-calorie malnutrition ([place])   . Protein-calorie malnutrition, severe 06/29/2018  . Hypokalemia 06/27/2018    Past Surgical History:  Procedure Laterality Date  . CHOLECYSTECTOMY    . COLONOSCOPY  01/2018   normal  . ESOPHAGOGASTRODUODENOSCOPY  01/2018   normal     OB History   No obstetric history on file.      Home Medications    Prior to Admission medications   Medication Sig Start Date End Date Taking? Authorizing Provider  cholestyramine (QUESTRAN) 4 g packet Take 1 packet (4 g total) by mouth 3 (three) times daily. 01/27/19   Matilde Haymaker, MD  dicyclomine (BENTYL) 20 MG tablet Take 1 tablet (20 mg total) by mouth 3 (three) times daily before meals. 12/22/18   Scot Jun, FNP  feeding supplement, ENSURE ENLIVE, (ENSURE ENLIVE) LIQD Take 237 mLs by mouth 3 (three) times daily between meals. 01/27/19   Matilde Haymaker, MD  MAGNESIUM PO Take 1 tablet by mouth daily.    [provider]  Multiple Vitamin (MULTIVITAMIN  WITH MINERALS) TABS tablet Take 1 tablet by mouth daily. 01/27/19   Mirian Mo, MD  ondansetron (ZOFRAN) 4 MG tablet Take 1 tablet (4 mg total) by mouth every 8 (eight) hours as needed for nausea or vomiting. Patient not taking: Reported on 01/26/2019 12/22/18   Bing Neighbors, FNP  Pancrelipase, Lip-Prot-Amyl, (CREON) 24000-76000 units CPEP Take 1 capsule (24,000 Units total) by mouth 3 (three) times daily with meals. 01/27/19   Mirian Mo, MD  potassium chloride SA (K-DUR) 20 MEQ tablet Take 2 tablets (40 mEq total) by mouth daily. Take 2 tablets in the morning  and one tablet at night. 01/28/19   Mirian Mo, MD    Family History Family History  Problem Relation Age of Onset  . Healthy Mother   . Diabetes Mother     Social History Social History   Tobacco Use  . Smoking status: Never Smoker  . Smokeless tobacco: Never Used  Substance Use Topics  . Alcohol use: No  . Drug use: Yes    Types: Marijuana     Allergies   Benadryl [diphenhydramine hcl]   Review of Systems Review of Systems  Constitutional: Negative for chills and fever.  HENT: Negative for congestion.   Respiratory: Negative for cough and shortness of breath.   Cardiovascular: Negative for chest pain and leg swelling.  Gastrointestinal: Positive for abdominal pain, diarrhea and nausea. Negative for blood in stool and vomiting.  Genitourinary: Negative for dysuria.  Musculoskeletal: Negative for myalgias.  Skin: Negative for rash.  Neurological: Positive for syncope. Negative for headaches.     Physical Exam Updated Vital Signs BP (!) 105/57   Pulse 70   Temp 98 F (36.7 C) (Oral)   Resp 16   SpO2 100%   Physical Exam Vitals signs and nursing note reviewed.  Constitutional:      Appearance: She is not ill-appearing.  HENT:     Head: Normocephalic and atraumatic.     Mouth/Throat:     Mouth: Mucous membranes are moist.  Eyes:     General: No scleral icterus.    Extraocular Movements: Extraocular movements intact.     Pupils: Pupils are equal, round, and reactive to light.  Neck:     Musculoskeletal: No neck rigidity.  Cardiovascular:     Rate and Rhythm: Normal rate and regular rhythm.     Pulses: Normal pulses.     Heart sounds: Normal heart sounds.  Pulmonary:     Effort: Pulmonary effort is normal.     Breath sounds: Normal breath sounds.  Abdominal:     General: Abdomen is flat. Bowel sounds are normal. There is no distension.     Palpations: Abdomen is soft. There is no mass.     Tenderness: There is abdominal tenderness (lower abdomen  diffusely). There is guarding (voluntary ). There is no right CVA tenderness or left CVA tenderness.  Musculoskeletal:     Right lower leg: No edema.     Left lower leg: No edema.     Comments: No calf tenderness  Skin:    General: Skin is warm and dry.     Capillary Refill: Capillary refill takes less than 2 seconds.  Neurological:     Mental Status: She is alert and oriented to person, place, and time. Mental status is at baseline.     Comments: Alert and oriented to self, place, time and event.  Speech is fluent, clear without dysarthria or dysphasia.  Strength 5/5 in upper/lower extremities  Sensation intact in upper/lower extremities  Normal finger-to-nose and feet tapping.  CN I not tested  CN II grossly intact visual fields bilaterally. Did not visualize posterior eye.   CN III, IV, VI PERRLA and EOMs intact bilaterally  CN V Intact sensation to sharp and light touch to the face  CN VII facial movements symmetric  CN VIII not tested  CN IX, X no uvula deviation, symmetric rise of soft palate  CN XI 5/5 SCM and trapezius strength bilaterally  CN XII Midline tongue protrusion, symmetric L/R movements   Psychiatric:        Behavior: Behavior normal.      ED Treatments / Results  Labs (all labs ordered are listed, but only abnormal results are displayed) Labs Reviewed  COMPREHENSIVE METABOLIC PANEL - Abnormal; Notable for the following components:      Result Value   Potassium 2.0 (*)    CO2 19 (*)    BUN 5 (*)    Creatinine, Ser 0.38 (*)    Calcium 8.7 (*)    Total Bilirubin 1.5 (*)    All other components within normal limits  URINALYSIS, ROUTINE W REFLEX MICROSCOPIC - Abnormal; Notable for the following components:   Color, Urine STRAW (*)    Specific Gravity, Urine 1.004 (*)    Hgb urine dipstick MODERATE (*)    Ketones, ur 5 (*)    All other components within normal limits  SARS CORONAVIRUS 2 (TAT 6-24 HRS)  CBC  MAGNESIUM  I-STAT BETA HCG BLOOD, ED ([facility],  [facility], [facility] ONLY)    EKG EKG Interpretation  Date/Time:  Friday June 04 2019 13:41:18 EDT Ventricular Rate:  67 PR Interval:    QRS Duration: 107 QT Interval:  426 QTC Calculation: 450 R Axis:   95 Text Interpretation:  Sinus rhythm Consider left atrial enlargement Consider RVH w/ secondary repol abnormality No significant change since last tracing Confirmed by Linwood DibblesKnapp, Jon [phone]([id number]) on 06/04/2019 1:45:21 PM   Radiology No results found.  Procedures Procedures (including critical care time) CRITICAL CARE Performed by: Gailen ShelterWylder S Dae Antonucci   Total critical care time: 35 minutes  Critical care time was exclusive of separately billable procedures and treating other patients.  Critical care was necessary to treat or prevent imminent or life-threatening deterioration.  Critical care was time spent personally by me on the following activities: development of treatment plan with patient and/or surrogate as well as nursing, discussions with consultants, evaluation of patient's response to treatment, examination of patient, obtaining history from patient or surrogate, ordering and performing treatments and interventions, ordering and review of laboratory studies, ordering and review of radiographic studies, pulse oximetry and re-evaluation of patient's condition.   Medications Ordered in ED Medications  potassium chloride 10 mEq in 100 mL IVPB (10 mEq Intravenous New Bag/Given 06/04/19 1443)  morphine 4 MG/ML injection 4 mg (has no administration in time range)  sodium chloride 0.9 % bolus 1,000 mL (has no administration in time range)  sodium chloride 0.9 % bolus 1,000 mL (0 mLs Intravenous Stopped 06/04/19 1339)  dicyclomine (BENTYL) capsule 10 mg (10 mg Oral Given 06/04/19 1338)  ondansetron (ZOFRAN) injection 4 mg (4 mg Intravenous Given 06/04/19 1338)  dicyclomine (BENTYL) capsule 10 mg (10 mg Oral Given 06/04/19 1420)  potassium chloride SA (KLOR-CON) CR tablet 40 mEq (40 mEq Oral Given  06/04/19 1443)     Initial Impression / Assessment and Plan / ED Course  I have reviewed the triage vital signs and the nursing  notes.  Pertinent labs & imaging results that were available during my care of the patient were reviewed by me and considered in my medical decision making (see chart for details).        Patient with history of hypokalemia presenting after several days of not eating and decreased fluids with episode of syncope.  Patient had a prodrome consistent with vasovagal or orthostatic event.   Patient has chronic abdominal pain; states no increased pain from usual.  Low suspicion for intra-abdominal pathology today.  Likely patient is hypovolemic and low potassium due to medical noncompliance with her oral potassium.   Patient with unchanged abdominal pain from usual; last CT done 7 months prior shows no acute abnormality other than small intrarenal calculi.  No indication for repeat imaging today.   We will check a BMP, CBC for anemia and mag.  Will replete electrolytes as needed.   Doubt PE as patient has no chest pain, shortness of breath, has reassuring vitals history and physical exam not consistent with VTE.  Doubt ACS as patient is young without risk factors.  Doubt subarachnoid hemorrhage or tumor as patient has no headache or neurologic changes.  Doubt seizure as patient has no postictal confusion and was alert and oriented for EMS immediately after syncopal event.  Doubt AAA as patient has only normal abdominal pain and no pulsatile mass on physical exam within stature.  Doubt ectopic pregnancy i-STAT hCG ordered.   Patient is afebrile, doubt sepsis or severe infection causing symptoms.  Patient has any palpitations or chest pain indicative of cardiac cause and syncope was nonexertional however EKG to assess for Brugada, WPW, HOCM, or other cardiac conduction abnormality.   Patient with potassium of 2.0-will replete with oral and IV potassium.  Will admit to  medicine for continued electrolyte repletion and observation as well as IV hydration.  Discussed with admitting attending Dr. Ashley Royalty who ordered additional 60 mEq PO potassium and 5 additional runs of IV potassium.     Final Clinical Impressions(s) / ED Diagnoses   Final diagnoses:  Hypokalemia  Weakness    ED Discharge Orders    None       Gailen Shelter, [place] 06/04/19 1653    Solon Augusta Fletcher, [place] 06/05/19 1046    Linwood Dibbles, MD 06/06/19 1208

## 2019-06-04 NOTE — ED Notes (Signed)
ED TO INPATIENT HANDOFF REPORT  ED Nurse Name and Phone #:   S Name/Age/Gender Sara Hill 28 y.o. female Room/Bed: WA25/WA25  Code Status   Code Status: Full Code  Home/SNF/Other Home Patient oriented to: self, place, time and situation Is this baseline? Yes   Triage Complete: Triage complete  Chief Complaint near syncope  Triage Note Pt BIB EMS from home. Pt passed out in front of significant other. Upon arrival pt was in and out of consciousness with EMS. EMS put pt trendelenburg position and patient was more alert. Pt has hx of syncope episodes. Pt told EMS she has not eaten in 2 days. Pt normally takes potassium supplement and did not take today.   102/56 BP (after 375 mL NS) 62 HR 100% RA CBG 106  20G L AC   Allergies Allergies  Allergen Reactions  . Benadryl [Diphenhydramine Hcl] Itching and Swelling    Pt reeports "feels like throat swelling".     Level of Care/Admitting Diagnosis ED Disposition    ED Disposition Condition [facility] Area: [place] [[id number]]  Level of Care: Telemetry [5]  Admit to tele based on following criteria: Complex arrhythmia (Bradycardia/Tachycardia)  Admit to tele based on following criteria: Eval of Syncope  Covid Evaluation: Asymptomatic Screening Protocol (No Symptoms)  Diagnosis: Hypokalemia [381017]  Admitting Physician: Georgette Shell [[id number]]  Attending Physician: Georgette Shell [[id number]]  Estimated length of stay: past midnight tomorrow  Certification:: I certify this patient will need inpatient services for at least 2 midnights  PT Class (Do Not Modify): Inpatient [101]  PT Acc Code (Do Not Modify): Private [1]       B Medical/Surgery History Past Medical History:  Diagnosis Date  . Gall stones   . Low blood potassium    Past Surgical History:  Procedure Laterality Date  . CHOLECYSTECTOMY    . COLONOSCOPY  01/2018   normal  .  ESOPHAGOGASTRODUODENOSCOPY  01/2018   normal     A IV Location/Drains/Wounds Patient Lines/Drains/Airways Status   Active Line/Drains/Airways    Name:   Placement date:   Placement time:   Site:   Days:   Peripheral IV 06/04/19 Left Antecubital   06/04/19    -    Antecubital   less than 1          Intake/Output Last 24 hours No intake or output data in the 24 hours ending 06/04/19 1752  Labs/Imaging Results for orders placed or performed during the hospital encounter of 06/04/19 (from the past 48 hour(s))  I-Stat beta hCG blood, ED     Status: None   Collection Time: 06/04/19  1:24 PM  Result Value Ref Range   I-stat hCG, quantitative <5.0 <5 mIU/mL   Comment 3            Comment:   GEST. AGE      CONC.  (mIU/mL)   <=1 WEEK        5 - 50     2 WEEKS       50 - 500     3 WEEKS       100 - 10,000     4 WEEKS     1,000 - 30,000        FEMALE AND NON-PREGNANT FEMALE:     LESS THAN 5 mIU/mL   Comprehensive metabolic panel     Status: Abnormal   Collection Time: 06/04/19  1:43 PM  Result Value  Ref Range   Sodium 140 135 - 145 mmol/L   Potassium 2.0 (LL) 3.5 - 5.1 mmol/L    Comment: CRITICAL RESULT CALLED TO, READ BACK BY AND VERIFIED WITH: Caniyah Murley,R AT 1425 ON 06/04/2019 BY JMOSLEY    Chloride 110 98 - 111 mmol/L   CO2 19 (L) 22 - 32 mmol/L   Glucose, Bld 91 70 - 99 mg/dL   BUN 5 (L) 6 - 20 mg/dL   Creatinine, Ser 7.68 (L) 0.44 - 1.00 mg/dL   Calcium 8.7 (L) 8.9 - 10.3 mg/dL   Total Protein 8.0 6.5 - 8.1 g/dL   Albumin 4.4 3.5 - 5.0 g/dL   AST 27 15 - 41 U/L   ALT 40 0 - 44 U/L   Alkaline Phosphatase 70 38 - 126 U/L   Total Bilirubin 1.5 (H) 0.3 - 1.2 mg/dL   GFR calc non Af Amer >60 >60 mL/min   GFR calc Af Amer >60 >60 mL/min   Anion gap 11 5 - 15    Comment: Performed at [facility], [address], [place], [place] [address]  CBC     Status: None   Collection Time: 06/04/19  1:43 PM  Result Value Ref Range   WBC 6.8 4.0 - 10.5 K/uL   RBC  4.42 3.87 - 5.11 MIL/uL   Hemoglobin 12.8 12.0 - 15.0 g/dL   HCT 62.0 35.5 - 97.4 %   MCV 83.7 80.0 - 100.0 fL   MCH 29.0 26.0 - 34.0 pg   MCHC 34.6 30.0 - 36.0 g/dL   RDW 16.3 84.5 - 36.4 %   Platelets 249 150 - 400 K/uL   nRBC 0.0 0.0 - 0.2 %    Comment: Performed at [facility], [address], [place], [place] [address]  Magnesium     Status: None   Collection Time: 06/04/19  1:43 PM  Result Value Ref Range   Magnesium 1.7 1.7 - 2.4 mg/dL    Comment: Performed at [facility], [address] [place], [place] [address]  Urinalysis, Routine w reflex microscopic     Status: Abnormal   Collection Time: 06/04/19  2:20 PM  Result Value Ref Range   Color, Urine STRAW (A) YELLOW   APPearance CLEAR CLEAR   Specific Gravity, Urine 1.004 (L) 1.005 - 1.030   pH 8.0 5.0 - 8.0   Glucose, UA NEGATIVE NEGATIVE mg/dL   Hgb urine dipstick MODERATE (A) NEGATIVE   Bilirubin Urine NEGATIVE NEGATIVE   Ketones, ur 5 (A) NEGATIVE mg/dL   Protein, ur NEGATIVE NEGATIVE mg/dL   Nitrite NEGATIVE NEGATIVE   Leukocytes,Ua NEGATIVE NEGATIVE   RBC / HPF 6-10 0 - 5 RBC/hpf   WBC, UA 0-5 0 - 5 WBC/hpf   Bacteria, UA NONE SEEN NONE SEEN   Mucus PRESENT     Comment: Performed at [facility], [address] [place], [place] [address]   No results found.  Pending Labs Unresulted Labs (From admission, onward)    Start     Ordered   06/05/19 0500  Comprehensive metabolic panel  Tomorrow morning,   R     06/04/19 1708   06/05/19 0500  CBC  Tomorrow morning,   R     06/04/19 1708   06/04/19 1730  Amylase  Once,   STAT    Comments: Please check lipase and amylase added to the labs which is already drawn if you cannot then please draw with  the next set of labs [facility]    06/04/19 1730   06/04/19 1725  Urine rapid drug screen (hosp performed)  ONCE - STAT,   STAT     06/04/19 1724   06/04/19 1709  Basic metabolic panel  Once,   STAT     Comments: Please check after the IV potassium replacement is done    06/04/19 1709   06/04/19 1508  SARS CORONAVIRUS 2 (TAT 6-24 HRS) Nasopharyngeal Nasopharyngeal Swab  (Asymptomatic/Tier 2 Patients Labs)  Once,   STAT    Question Answer Comment  Is this test for diagnosis or screening Screening   Symptomatic for COVID-19 as defined by CDC No   Hospitalized for COVID-19 No   Admitted to ICU for COVID-19 No   Previously tested for COVID-19 Yes   Resident in a congregate (group) care setting No   Employed in healthcare setting No   Pregnant No      06/04/19 1507          Vitals/Pain Today's Vitals   06/04/19 1500 06/04/19 1630 06/04/19 1704 06/04/19 1730  BP: (!) 105/57 95/63 93/65  (!) 94/56  Pulse: 70 72 78 65  Resp: 16 18 (!) 25 16  Temp:      TempSrc:      SpO2: 100% 99% 99% 98%    Isolation Precautions No active isolations  Medications Medications  0.9 % NaCl with KCl 40 mEq / L  infusion (has no administration in time range)  magnesium sulfate IVPB 1 g 100 mL (1 g Intravenous New Bag/Given 06/04/19 1702)  potassium chloride 10 mEq in 100 mL IVPB (has no administration in time range)  enoxaparin (LOVENOX) injection 40 mg (has no administration in time range)  dicyclomine (BENTYL) tablet 20 mg (has no administration in time range)  lipase/protease/amylase (CREON) capsule 24,000 Units (has no administration in time range)  ondansetron (ZOFRAN) injection 4 mg (has no administration in time range)  HYDROmorphone (DILAUDID) injection 0.5 mg (has no administration in time range)  sodium chloride 0.9 % bolus 1,000 mL (0 mLs Intravenous Stopped 06/04/19 1339)  dicyclomine (BENTYL) capsule 10 mg (10 mg Oral Given 06/04/19 1338)  ondansetron (ZOFRAN) injection 4 mg (4 mg Intravenous Given 06/04/19 1338)  dicyclomine (BENTYL) capsule 10 mg (10 mg Oral Given 06/04/19 1420)  potassium chloride SA (KLOR-CON) CR tablet 40 mEq (40 mEq Oral Given 06/04/19 1443)  potassium  chloride 10 mEq in 100 mL IVPB (0 mEq Intravenous Stopped 06/04/19 1703)  morphine 4 MG/ML injection 4 mg (4 mg Intravenous Given 06/04/19 1535)  sodium chloride 0.9 % bolus 1,000 mL (0 mLs Intravenous Stopped 06/04/19 1610)  cyclobenzaprine (FLEXERIL) tablet 5 mg (5 mg Oral Given 06/04/19 1628)  HYDROmorphone (DILAUDID) injection 1 mg (1 mg Intravenous Given 06/04/19 1701)    Mobility walks Low fall risk   Focused Assessments   R Recommendations: See Admitting Provider Note  Report given to:   Additional Notes:

## 2019-06-04 NOTE — ED Notes (Signed)
Pati Gallo, PA aware patients potassium is 2.0

## 2019-06-04 NOTE — ED Notes (Signed)
Pts visitor in hallway requesting for the doctor to come in because the pt is in pain. Advised PA who said he would put in for pain medication.  Told pt and visitor same.  Pt requesting to be "unhooked" so she could go to restroom.  Pt ambulated to RR and back to room without assistance.  Pt placed back on monitor and v/s cycling.

## 2019-06-04 NOTE — ED Triage Notes (Signed)
Pt BIB EMS from home. Pt passed out in front of significant other. Upon arrival pt was in and out of consciousness with EMS. EMS put pt trendelenburg position and patient was more alert. Pt has hx of syncope episodes. Pt told EMS she has not eaten in 2 days. Pt normally takes potassium supplement and did not take today.   102/56 BP (after 375 mL NS) 62 HR 100% RA CBG 106  20G L AC

## 2019-06-05 DIAGNOSIS — R109 Unspecified abdominal pain: Secondary | ICD-10-CM

## 2019-06-05 DIAGNOSIS — I951 Orthostatic hypotension: Secondary | ICD-10-CM

## 2019-06-05 DIAGNOSIS — D649 Anemia, unspecified: Secondary | ICD-10-CM

## 2019-06-05 LAB — COMPREHENSIVE METABOLIC PANEL
ALT: 41 U/L (ref 0–44)
AST: 33 U/L (ref 15–41)
Albumin: 3.3 g/dL — ABNORMAL LOW (ref 3.5–5.0)
Alkaline Phosphatase: 54 U/L (ref 38–126)
Anion gap: 6 (ref 5–15)
BUN: <5 mg/dL — ABNORMAL LOW (ref 6–20)
CO2: 17 mmol/L — ABNORMAL LOW (ref 22–32)
Calcium: 7.9 mg/dL — ABNORMAL LOW (ref 8.9–10.3)
Chloride: 116 mmol/L — ABNORMAL HIGH (ref 98–111)
Creatinine, Ser: 0.33 mg/dL — ABNORMAL LOW (ref 0.44–1.00)
GFR calc Af Amer: >60 mL/min (ref >60)
GFR calc non Af Amer: >60 mL/min (ref >60)
Glucose, Bld: 81 mg/dL (ref 70–99)
Potassium: 3.5 mmol/L (ref 3.5–5.1)
Sodium: 139 mmol/L (ref 135–145)
Total Bilirubin: 1.1 mg/dL (ref 0.3–1.2)
Total Protein: 5.9 g/dL — ABNORMAL LOW (ref 6.5–8.1)

## 2019-06-05 LAB — VITAMIN B12: Vitamin B-12: 342 pg/mL (ref 180–914)

## 2019-06-05 LAB — MAGNESIUM: Magnesium: 1.8 mg/dL (ref 1.7–2.4)

## 2019-06-05 LAB — FOLATE: Folate: 3.5 ng/mL — ABNORMAL LOW (ref >5.9)

## 2019-06-05 LAB — RETICULOCYTES
Immature Retic Fract: 11.3 % (ref 2.3–15.9)
RBC.: 4.09 MIL/uL (ref 3.87–5.11)
Retic Count, Absolute: 60.9 10*3/uL (ref 19.0–186.0)
Retic Ct Pct: 1.5 % (ref 0.4–3.1)

## 2019-06-05 LAB — CBC
HCT: 30.5 % — ABNORMAL LOW (ref 36.0–46.0)
Hemoglobin: 10.1 g/dL — ABNORMAL LOW (ref 12.0–15.0)
MCH: 29 pg (ref 26.0–34.0)
MCHC: 33.1 g/dL (ref 30.0–36.0)
MCV: 87.6 fL (ref 80.0–100.0)
Platelets: 168 10*3/uL (ref 150–400)
RBC: 3.48 MIL/uL — ABNORMAL LOW (ref 3.87–5.11)
RDW: 15.5 % (ref 11.5–15.5)
WBC: 4.2 10*3/uL (ref 4.0–10.5)
nRBC: 0 % (ref 0.0–0.2)

## 2019-06-05 LAB — IRON AND TIBC
Iron: 163 ug/dL (ref 28–170)
Saturation Ratios: 45 % — ABNORMAL HIGH (ref 10.4–31.8)
TIBC: 366 ug/dL (ref 250–450)
UIBC: 203 ug/dL

## 2019-06-05 LAB — LIPASE, BLOOD: Lipase: 20 U/L (ref 11–51)

## 2019-06-05 LAB — FERRITIN: Ferritin: 24 ng/mL (ref 11–307)

## 2019-06-05 LAB — PHOSPHORUS: Phosphorus: 2.1 mg/dL — ABNORMAL LOW (ref 2.5–4.6)

## 2019-06-05 LAB — SARS CORONAVIRUS 2 (TAT 6-24 HRS): SARS Coronavirus 2: NEGATIVE

## 2019-06-05 MED ORDER — CHOLESTYRAMINE 4 G PO PACK
4.0000 g | PACK | Freq: Three times a day (TID) | ORAL | Status: DC
  Administered 2019-06-05: 4 g via ORAL
  Filled 2019-06-05 (×5): qty 1

## 2019-06-05 MED ORDER — ADULT MULTIVITAMIN W/MINERALS CH
1.0000 | ORAL_TABLET | Freq: Every day | ORAL | Status: DC
  Administered 2019-06-05 – 2019-06-08 (×4): 1 via ORAL
  Filled 2019-06-05 (×4): qty 1

## 2019-06-05 MED ORDER — POTASSIUM CHLORIDE 10 MEQ/100ML IV SOLN
INTRAVENOUS | Status: AC
  Administered 2019-06-05: 10 meq
  Filled 2019-06-05: qty 100

## 2019-06-05 MED ORDER — SODIUM BICARBONATE 650 MG PO TABS
650.0000 mg | ORAL_TABLET | Freq: Three times a day (TID) | ORAL | Status: DC
  Administered 2019-06-05 – 2019-06-06 (×4): 650 mg via ORAL
  Filled 2019-06-05 (×4): qty 1

## 2019-06-05 MED ORDER — HYDROMORPHONE HCL 1 MG/ML IJ SOLN: 1.0000 mg | Freq: Once | INTRAMUSCULAR | Status: DC

## 2019-06-05 MED ORDER — ORAL CARE MOUTH RINSE
15.0000 mL | Freq: Two times a day (BID) | OROMUCOSAL | Status: DC
  Administered 2019-06-06: 15 mL via OROMUCOSAL

## 2019-06-05 MED ORDER — MAGNESIUM SULFATE 2 GM/50ML IV SOLN
2.0000 g | Freq: Once | INTRAVENOUS | Status: AC
  Administered 2019-06-05: 2 g via INTRAVENOUS
  Filled 2019-06-05: qty 50

## 2019-06-05 MED ORDER — ENSURE ENLIVE PO LIQD
237.0000 mL | Freq: Two times a day (BID) | ORAL | Status: DC
  Administered 2019-06-05 – 2019-06-06 (×3): 237 mL via ORAL

## 2019-06-05 MED ORDER — HYDROMORPHONE HCL 1 MG/ML IJ SOLN
0.5000 mg | Freq: Once | INTRAMUSCULAR | Status: DC
  Filled 2019-06-05: qty 0.5

## 2019-06-05 MED ORDER — HYDROMORPHONE HCL 1 MG/ML IJ SOLN
1.0000 mg | INTRAMUSCULAR | Status: DC | PRN
  Administered 2019-06-05 – 2019-06-07 (×9): 1 mg via INTRAVENOUS
  Filled 2019-06-05 (×9): qty 1

## 2019-06-05 MED ORDER — POTASSIUM PHOSPHATES 15 MMOLE/5ML IV SOLN
20.0000 mmol | Freq: Once | INTRAVENOUS | Status: AC
  Administered 2019-06-05: 20 mmol via INTRAVENOUS
  Filled 2019-06-05: qty 6.67

## 2019-06-05 MED ORDER — SODIUM CHLORIDE 0.9 % IV BOLUS
500.0000 mL | Freq: Once | INTRAVENOUS | Status: AC
  Administered 2019-06-05: 500 mL via INTRAVENOUS

## 2019-06-05 NOTE — Progress Notes (Addendum)
PROGRESS NOTE    Sara ProZestazzeta Helf  ZOX:[id number] DOB: 04/23/1991 DOA: 06/04/2019 PCP: Bing NeighborsHarris, Kimberly S, FNP   Brief Narrative:  HPI per Jerelene ReddenElizabeth Mathews on 06/04/2019 Sara Hill is a [age] y.o. female with medical history significant of chronic malabsorption and diarrhea since cholecystectomy in 2015.  According to her fianc patient passed out while she was a passenger in the car.  She felt dizzy and passed out briefly.  She reports these episodes similar to prior episodes when she has low potassium.  Potassium is prescribed daily but she only takes it as needed.  She has chronic abdominal pain and chronic diarrhea.  Patient has history of syncopal episodes.  Her appetite is poor and she has not eaten anything in 2 days.  She is not taking her prescribed potassium supplements.  Denies fever or chills complains of nausea.  Denies urinary complaints.  Denies cough shortness of breath chest pain.  Denies headache changes with her vision.  She had a hospital admission in 01/30/2019 for hypokalemia and generalized weakness.  ED Course: Patient received IV potassium.  Blood pressure 93/65 pulse is 78 respiration 25 temperature 9899% on room air.  Sodium 140 potassium 2.0 BUN 5 creatinine 0.38 magnesium 1.7.  WBC 6.8 hemoglobin 12.8 platelet count 249.  UA with ketones moderate amount of blood  **Interim History   Assessment & Plan:   Active Problems:   Hypokalemia   Chronic malnutrition (HCC)  Severe Hypokalemia  -In a patient with history of malabsorption and known history of hypokalemia.   -She is noncompliant to her medications.  On admission her potassium is 2.0.  This is aggressively being repleted. -Recheck levels after IV replacement.  Continue p.o. replacement. -C/w NS at 75 mL/hr + 40 mEQ; Also replete with IV KPhos -Admitted to telemetry. -Magnesium is also low replete and recheck. Check lipase -She has history of THC use check urine drug screen and it was +  again  HIV and RPR negative this year. -K+ this AM was 3.5 but continues to have Diarrhea  Chronic abdominal pain and chronic diarrhea  -Patient is on Bentyl 20 mg 3 times a day at home continue.   -Continue Creon Questran Zofran -CT scan March 2020 shows bilateral nonobstructing renal calculi no definite acute intra-abdominal or intrapelvic abnormalities CT of the abdomen pelvis with contrast. -Since her abdomen is soft and nontender I am not going to get a CT of her abdomen at this time but if worsens will obtain CT Scan -Continues to Have Diarrhea  Chronic malnutrition due to malabsorption  -consulted dietary  -C/w supplementation   Hypotension  -normal saline to be continued as above  Syncope likely secondary to being orthostatic.   -She is too weak to stand to check orthostatics but they have been ordered   -In addition to that she has severe hypokalemia and hypomagnesemia with muscle weakness.  Hypophosphatemia -Mild at 2.1 -Replete with IV KPhos 20 mmol -Continue to Monitor and Replete as Necessary -Repeat CMP in AM  Metabolic Acidosis  -Patient CO2 was 17, chloride of 116, and anion gap was 6 -In the setting of diarrhea -Supplement with sodium bicarb tablets 650 mg p.o. 3 times daily-continue monitor and trend and repeat CMP in a.m.  Normocytic Anemia Plan patient's hemoglobin/hematocrit dropped from 12.8/37.0 and is now 10.1/30.5 -Check anemia panel showed an iron level of 163, U IBC of 2 3, TIBC of 366, saturation ratios of 45%, ferritin level 24, folate level 3.5 g vitamin B12 of 340 -  Continue to monitor for signs and symptoms of bleeding; currently no overt bleeding noted -Continue to monitor and repeat CBC in a.m.  DVT prophylaxis: Enoxaparin 30 mg sq q24h Code Status: FULL CODE  Family Communication: No family present ate bedside  Disposition Plan: Ending clinical improvement but anticipating discharging home in the next 24 to 48 hours if no longer  orthostatic and electrolytes and diarrhea is improved  Consultants:   None  Procedures: None  Antimicrobials:  Anti-infectives (From admission, onward)   None     Subjective: Seen and examined and she was very jittery this morning and wanting to use the bathroom and states that she was still having some abdominal cramping and pain.  No no nausea or vomiting but still having some diarrhea.  No lightheadedness or dizziness but no other concerns or complaints at this time.  Objective: Vitals:   06/05/19 1308 06/05/19 1310 06/05/19 1311 06/05/19 1315  BP: (!) 99/56 99/67 102/66 104/69  Pulse: 74  64 64  Resp: 18     Temp: 97.9 F (36.6 C)     TempSrc: Oral     SpO2: 100%   100%  Weight:      Height:       No intake or output data in the 24 hours ending 06/05/19 1827 Filed Weights   06/04/19 1837  Weight: 36.4 kg   Examination: Physical Exam:  Constitutional: Extremely thin female in some distress and appears very anxious and jittery Eyes: Lids and conjunctivae normal, sclerae anicteric  ENMT: External Ears, Nose appear normal. Grossly normal hearing.  Neck: Appears normal, supple, no cervical masses, normal ROM, no appreciable thyromegaly; no JVD Respiratory: Clear to auscultation bilaterally, no wheezing, rales, rhonchi or crackles. Normal respiratory effort and patient is not tachypenic. No accessory muscle use.  Cardiovascular: Tachycardic rate, no murmurs / rubs / gallops. S1 and S2 auscultated. No extremity edema. 2+ pedal pulses. No carotid bruits.  Abdomen: Soft, Tender, non-Distended.  Bowel sounds positive x4 and slightly hyperactive.  GU: Deferred. Musculoskeletal: No clubbing / cyanosis of digits/nails. Normal strength and muscle tone.  Skin: No rashes, lesions, ulcers on a limited skin evaluation. No induration; Warm and dry.  Neurologic: CN 2-12 grossly intact with no focal deficits. Romberg sign and cerebellar reflexes not assessed.  Psychiatric: Normal  judgment and insight. Alert and oriented x 3. Extremely anxious mood and appropriate affect.   Data Reviewed: I have personally reviewed following labs and imaging studies  CBC: Recent Labs  Lab 06/04/19 1343 06/05/19 0059  WBC 6.8 4.2  HGB 12.8 10.1*  HCT 37.0 30.5*  MCV 83.7 87.6  PLT 249 168   Basic Metabolic Panel: Recent Labs  Lab 06/04/19 1343 06/05/19 0059 06/05/19 0924  NA 140 139  --   K 2.0* 3.5  --   CL 110 116*  --   CO2 19* 17*  --   GLUCOSE 91 81  --   BUN 5* <5*  --   CREATININE 0.38* 0.33*  --   CALCIUM 8.7* 7.9*  --   MG 1.7  --  1.8  PHOS  --   --  2.1*   GFR: Estimated Creatinine Clearance: 59.6 mL/min (A) (by C-G formula based on SCr of 0.33 mg/dL (L)). Liver Function Tests: Recent Labs  Lab 06/04/19 1343 06/05/19 0059  AST 27 33  ALT 40 41  ALKPHOS 70 54  BILITOT 1.5* 1.1  PROT 8.0 5.9*  ALBUMIN 4.4 3.3*   Recent Labs  Lab 06/04/19 1343 06/05/19 0059  LIPASE  --  20  AMYLASE 25*  --    No results for input(s): AMMONIA in the last 168 hours. Coagulation Profile: No results for input(s): INR, PROTIME in the last 168 hours. Cardiac Enzymes: No results for input(s): CKTOTAL, CKMB, CKMBINDEX, TROPONINI in the last 168 hours. BNP (last 3 results) No results for input(s): PROBNP in the last 8760 hours. HbA1C: No results for input(s): HGBA1C in the last 72 hours. CBG: No results for input(s): GLUCAP in the last 168 hours. Lipid Profile: No results for input(s): CHOL, HDL, LDLCALC, TRIG, CHOLHDL, LDLDIRECT in the last 72 hours. Thyroid Function Tests: No results for input(s): TSH, T4TOTAL, FREET4, T3FREE, THYROIDAB in the last 72 hours. Anemia Panel: Recent Labs    06/05/19 0924  VITAMINB12 342  FOLATE 3.5*  FERRITIN 24  TIBC 366  IRON 163  RETICCTPCT 1.5   Sepsis Labs: No results for input(s): PROCALCITON, LATICACIDVEN in the last 168 hours.  Recent Results (from the past 240 hour(s))  SARS CORONAVIRUS 2 (TAT 6-24 HRS)  Nasopharyngeal Nasopharyngeal Swab     Status: None   Collection Time: 06/04/19  3:35 PM   Specimen: Nasopharyngeal Swab  Result Value Ref Range Status   SARS Coronavirus 2 NEGATIVE NEGATIVE Final    Comment: (NOTE) SARS-CoV-2 target nucleic acids are NOT DETECTED. The SARS-CoV-2 RNA is generally detectable in upper and lower respiratory specimens during the acute phase of infection. Negative results do not preclude SARS-CoV-2 infection, do not rule out co-infections with other pathogens, and should not be used as the sole basis for treatment or other patient management decisions. Negative results must be combined with clinical observations, patient history, and epidemiological information. The expected result is Negative. Fact Sheet for Patients: SugarRoll.be Fact Sheet for Healthcare Providers: https://www.woods-mathews.com/ This test is not yet approved or cleared by the [place] FDA and  has been authorized for detection and/or diagnosis of SARS-CoV-2 by FDA under an Emergency Use Authorization (EUA). This EUA will remain  in effect (meaning this test can be used) for the duration of the COVID-19 declaration under Section 56 4(b)(1) of the Act, 21 U.S.C. section 360bbb-3(b)(1), unless the authorization is terminated or revoked sooner. Performed at [facility], [place] [address], [place], [place] [address]     Radiology Studies: No results found.  Scheduled Meds: . cholestyramine  4 g Oral TID  . dicyclomine  20 mg Oral TID AC  . enoxaparin (LOVENOX) injection  30 mg Subcutaneous Q24H  . feeding supplement (ENSURE ENLIVE)  237 mL Oral BID BM  .  HYDROmorphone (DILAUDID) injection  0.5 mg Intravenous Once  . lipase/protease/amylase  24,000 Units Oral TID WC  . multivitamin with minerals  1 tablet Oral Daily  . ondansetron (ZOFRAN) IV  4 mg Intravenous Q8H  . sodium bicarbonate  650 mg Oral TID   Continuous Infusions: .  0.9 % NaCl with KCl 40 mEq / L 75 mL/hr (06/05/19 0948)    LOS: 1 day   Kerney Elbe, DO [facility] PAGER is on [place]  If 7PM-7AM, please contact night-coverage [email or web address] Password [facility] 06/05/2019, 6:27 PM

## 2019-06-05 NOTE — Progress Notes (Signed)
Upon initially hanging first bag of potassium, patient complained of pain in her arm stating, "it burns" and "I can't take it", and insisted that infusion be stopped and that no further doses will be given. She was irritated and beligerent with staff. After many attempts to encourage patient to allow potassium infusions, she relented only if infusions were to be given at a reduced flow rate. Infusions were resumed at a flow rate of 50 mls/hr. Meanwhile, lab called and asked if 5 am labs could be drawn with the labs due at 0100. I instructed the lab to draw all labs at 0100 fearing patient may refuse a second lab draw.

## 2019-06-05 NOTE — Progress Notes (Signed)
PT Cancellation Note  Patient Details Name: Sara Hill MRN: [id number] DOB: Jun 19, 1991   Cancelled Treatment:    Reason Eval/Treat Not Completed: PT screened, no needs identified, will sign off. Patient observed ambulating from bathroom to bed and bed to recliner without difficulty. Thank you for the referral.    Floria Raveling. Hartnett-Rands, MS, PT Per [place] [phone] 06/05/2019, 11:08 AM

## 2019-06-05 NOTE — Plan of Care (Signed)

## 2019-06-06 DIAGNOSIS — E872 Acidosis: Secondary | ICD-10-CM

## 2019-06-06 LAB — CBC WITH DIFFERENTIAL/PLATELET
Abs Immature Granulocytes: 0.02 10*3/uL (ref 0.00–0.07)
Basophils Absolute: 0 10*3/uL (ref 0.0–0.1)
Basophils Relative: 0 %
Eosinophils Absolute: 0 10*3/uL (ref 0.0–0.5)
Eosinophils Relative: 1 %
HCT: 31.2 % — ABNORMAL LOW (ref 36.0–46.0)
Hemoglobin: 10.6 g/dL — ABNORMAL LOW (ref 12.0–15.0)
Immature Granulocytes: 0 %
Lymphocytes Relative: 23 %
Lymphs Abs: 1.4 10*3/uL (ref 0.7–4.0)
MCH: 29.3 pg (ref 26.0–34.0)
MCHC: 34 g/dL (ref 30.0–36.0)
MCV: 86.2 fL (ref 80.0–100.0)
Monocytes Absolute: 0.5 10*3/uL (ref 0.1–1.0)
Monocytes Relative: 8 %
Neutro Abs: 4.2 10*3/uL (ref 1.7–7.7)
Neutrophils Relative %: 68 %
Platelets: 192 10*3/uL (ref 150–400)
RBC: 3.62 MIL/uL — ABNORMAL LOW (ref 3.87–5.11)
RDW: 15.6 % — ABNORMAL HIGH (ref 11.5–15.5)
WBC: 6.2 10*3/uL (ref 4.0–10.5)
nRBC: 0 % (ref 0.0–0.2)

## 2019-06-06 LAB — COMPREHENSIVE METABOLIC PANEL
ALT: 37 U/L (ref 0–44)
AST: 32 U/L (ref 15–41)
Albumin: 3.5 g/dL (ref 3.5–5.0)
Alkaline Phosphatase: 60 U/L (ref 38–126)
Anion gap: 8 (ref 5–15)
BUN: <5 mg/dL — ABNORMAL LOW (ref 6–20)
CO2: 18 mmol/L — ABNORMAL LOW (ref 22–32)
Calcium: 8.3 mg/dL — ABNORMAL LOW (ref 8.9–10.3)
Chloride: 111 mmol/L (ref 98–111)
Creatinine, Ser: 0.47 mg/dL (ref 0.44–1.00)
GFR calc Af Amer: >60 mL/min (ref >60)
GFR calc non Af Amer: >60 mL/min (ref >60)
Glucose, Bld: 124 mg/dL — ABNORMAL HIGH (ref 70–99)
Potassium: 4.1 mmol/L (ref 3.5–5.1)
Sodium: 137 mmol/L (ref 135–145)
Total Bilirubin: 1.2 mg/dL (ref 0.3–1.2)
Total Protein: 6.1 g/dL — ABNORMAL LOW (ref 6.5–8.1)

## 2019-06-06 LAB — PHOSPHORUS: Phosphorus: 3.1 mg/dL (ref 2.5–4.6)

## 2019-06-06 LAB — MAGNESIUM: Magnesium: 1.9 mg/dL (ref 1.7–2.4)

## 2019-06-06 MED ORDER — ENSURE ENLIVE PO LIQD
237.0000 mL | Freq: Three times a day (TID) | ORAL | Status: DC
  Administered 2019-06-06 – 2019-06-08 (×5): 237 mL via ORAL

## 2019-06-06 MED ORDER — ACETAMINOPHEN 325 MG PO TABS
650.0000 mg | ORAL_TABLET | Freq: Four times a day (QID) | ORAL | Status: DC | PRN
  Administered 2019-06-06 – 2019-06-07 (×4): 650 mg via ORAL
  Filled 2019-06-06 (×4): qty 2

## 2019-06-06 MED ORDER — COLESTIPOL HCL 1 G PO TABS
2.0000 g | ORAL_TABLET | Freq: Two times a day (BID) | ORAL | Status: DC
  Administered 2019-06-06 (×2): 2 g via ORAL
  Filled 2019-06-06 (×3): qty 2

## 2019-06-06 MED ORDER — SODIUM BICARBONATE-DEXTROSE 150-5 MEQ/L-% IV SOLN
150.0000 meq | INTRAVENOUS | Status: DC
  Administered 2019-06-06 – 2019-06-07 (×2): 150 meq via INTRAVENOUS
  Filled 2019-06-06 (×3): qty 1000

## 2019-06-06 NOTE — Progress Notes (Signed)
Initial Nutrition Assessment  DOCUMENTATION CODES:   Underweight  INTERVENTION:   -Ensure Enlive po TID, each supplement provides 350 kcal and 20 grams of protein  NUTRITION DIAGNOSIS:   Inadequate oral intake related to poor appetite as evidenced by per patient/family report.  GOAL:   Patient will meet greater than or equal to 90% of their needs  MONITOR:   PO intake, Supplement acceptance, Labs, Weight trends, I & O's  REASON FOR ASSESSMENT:   Consult Assessment of nutrition requirement/status, Poor PO  ASSESSMENT:   28 y.o. female with medical history significant of chronic malabsorption and diarrhea since cholecystectomy in 2015. Admitted for severe hypokalemia.  **RD working remotely**  Patient with continued poor appetite. Pt's PO intakes tend to fluctuate, most recently she did not eat anything for 2 days PTA. Pt with chronic diarrhea. Ensure supplements have been ordered BID, will increase to TID.   Per weight records, pt's weight has fluctuated between 75-87 lbs since November 2019. Suspect that malnutrition continues but unable to diagnose without NFPE.  Medications: Creon capsule, Multivitamin with minerals daily, IV Zofran Labs reviewed: Mg/Phos WNL  K WNL  NUTRITION - FOCUSED PHYSICAL EXAM:  Unable to perform -working remotely  Diet Order:   Diet Order            DIET SOFT Room service appropriate? Yes; Fluid consistency: Thin  Diet effective now              EDUCATION NEEDS:   No education needs have been identified at this time  Skin:  Skin Assessment: Reviewed RN Assessment  Last BM:  10/24- type 7  Height:   Ht Readings from Last 1 Encounters:  06/04/19 5\' 1"  (1.549 m)    Weight:   Wt Readings from Last 1 Encounters:  06/04/19 36.4 kg    Ideal Body Weight:  47.7 kg  BMI:  Body mass index is 15.16 kg/m.  Estimated Nutritional Needs:   Kcal:  1400-1600  Protein:  60-80g  Fluid:  1.6L/day  Clayton Bibles, MS, RD,  LDN Inpatient Clinical Dietitian Pager: [phone] After Hours Pager: [phone]

## 2019-06-06 NOTE — Consult Note (Signed)
[facility]:  [facility] Primary Care Physician:  Bing NeighborsHarris, Kimberly S, FNP Primary Gastroenterologist: Gentry FitzUnassigned  Reason for Consultation: Diarrhea  HPI: Sara Hill is a 28 y.o. female presented to the hospital on June 04, 2019 presented to the hospital with abdominal pain and low potassium.  Was found to have a potassium of 2.  She is complaining of diarrhea since her cholecystectomy in 2015.  GI is consulted for further evaluation.  Patient was seen by [facility] for evaluation of chronic diarrhea.  She underwent EGD and colonoscopy in June 2019 which were unremarkable per notes.    Normal TSH in the past.  She was advised to try cholestyramine from previous GI physician which she has not tried.  GI pathogen panel negative in the past.  Patient seen and examined at bedside.  Complaining of diarrhea with 6-7 bowel movements per day.  Denies any blood in the stool or black stool.  Complaining of nausea and vomiting.  She continues to use marijuana.  Denies any family history of colon cancer.  Past Medical History:  Diagnosis Date  . Gall stones   . Low blood potassium     Past Surgical History:  Procedure Laterality Date  . CHOLECYSTECTOMY    . COLONOSCOPY  01/2018   normal  . ESOPHAGOGASTRODUODENOSCOPY  01/2018   normal    Prior to Admission medications   Medication Sig Start Date End Date Taking? Authorizing Provider  dicyclomine (BENTYL) 20 MG tablet Take 1 tablet (20 mg total) by mouth 3 (three) times daily before meals. Patient taking differently: Take 20 mg by mouth 3 (three) times daily as needed for spasms.  12/22/18  Yes Bing NeighborsHarris, Kimberly S, FNP  feeding supplement, ENSURE ENLIVE, (ENSURE ENLIVE) LIQD Take 237 mLs by mouth 3 (three) times daily between meals. Patient taking differently: Take 237 mLs by mouth 3 (three) times daily as needed (nutrition).  01/27/19  Yes Mirian MoFrank, Peter, MD  MAGNESIUM PO Take 1 tablet by mouth daily as needed (low magnesium).    Yes  [provider]  Pancrelipase, Lip-Prot-Amyl, (CREON) 24000-76000 units CPEP Take 1 capsule (24,000 Units total) by mouth 3 (three) times daily with meals. 01/27/19  Yes Mirian MoFrank, Peter, MD  potassium chloride SA (K-DUR) 20 MEQ tablet Take 2 tablets (40 mEq total) by mouth daily. Take 2 tablets in the morning and one tablet at night. Patient taking differently: Take 20 mEq by mouth 2 (two) times daily.  01/28/19  Yes Mirian MoFrank, Peter, MD  cholestyramine Lanetta Inch(QUESTRAN) 4 g packet Take 1 packet (4 g total) by mouth 3 (three) times daily. Patient not taking: Reported on 06/04/2019 01/27/19   Mirian MoFrank, Peter, MD  Multiple Vitamin (MULTIVITAMIN WITH MINERALS) TABS tablet Take 1 tablet by mouth daily. Patient not taking: Reported on 06/04/2019 01/27/19   Mirian MoFrank, Peter, MD  ondansetron (ZOFRAN) 4 MG tablet Take 1 tablet (4 mg total) by mouth every 8 (eight) hours as needed for nausea or vomiting. Patient not taking: Reported on 01/26/2019 12/22/18   Bing NeighborsHarris, Kimberly S, FNP    Scheduled Meds: . cholestyramine  4 g Oral TID  . dicyclomine  20 mg Oral TID AC  . enoxaparin (LOVENOX) injection  30 mg Subcutaneous Q24H  . feeding supplement (ENSURE ENLIVE)  237 mL Oral BID BM  .  HYDROmorphone (DILAUDID) injection  0.5 mg Intravenous Once  . lipase/protease/amylase  24,000 Units Oral TID WC  . mouth rinse  15 mL Mouth Rinse BID  . multivitamin with minerals  1  tablet Oral Daily  . ondansetron (ZOFRAN) IV  4 mg Intravenous Q8H   Continuous Infusions: . sodium bicarbonate 150 mEq in dextrose 5% 1000 mL 150 mEq (06/06/19 0949)   PRN Meds:.HYDROmorphone (DILAUDID) injection  Allergies as of 06/04/2019 - Review Complete 06/04/2019  Allergen Reaction Noted  . Benadryl [diphenhydramine hcl] Itching and Swelling 11/26/2014    Family History  Problem Relation Age of Onset  . Healthy Mother   . Diabetes Mother     Social History   Socioeconomic History  . Marital status: Single    Spouse name: Not on file   . Number of children: Not on file  . Years of education: Not on file  . Highest education level: Not on file  Occupational History  . Not on file  Social Needs  . Financial resource strain: Not on file  . Food insecurity    Worry: Not on file    Inability: Not on file  . Transportation needs    Medical: Not on file    Non-medical: Not on file  Tobacco Use  . Smoking status: Never Smoker  . Smokeless tobacco: Never Used  Substance and Sexual Activity  . Alcohol use: No  . Drug use: Yes    Types: Marijuana  . Sexual activity: Never  Lifestyle  . Physical activity    Days per week: Not on file    Minutes per session: Not on file  . Stress: Not on file  Relationships  . Social Herbalist on phone: Not on file    Gets together: Not on file    Attends religious service: Not on file    Active member of club or organization: Not on file    Attends meetings of clubs or organizations: Not on file    Relationship status: Not on file  . Intimate partner violence    Fear of current or ex partner: Not on file    Emotionally abused: Not on file    Physically abused: Not on file    Forced sexual activity: Not on file  Other Topics Concern  . Not on file  Social History Narrative   Lives at home with a friend.  Independent at baseline    Review of Systems: All 12 point review negative except as stated above in HPI.  Physical Exam: Vital signs: Vitals:   06/05/19 2136 06/06/19 0508  BP: (!) 96/58 94/63  Pulse: 67 86  Resp: 18 18  Temp: 98.2 F (36.8 C) 98.1 F (36.7 C)  SpO2: 100% 99%   Last BM Date: 06/05/19 Physical Exam  Constitutional: She is oriented to person, place, and time. No distress.  Thin appearing patient  HENT:  Head: Normocephalic and atraumatic.  Mouth/Throat: No oropharyngeal exudate.  Eyes: Pupils are equal, round, and reactive to light. EOM are normal. No scleral icterus.  Neck: Neck supple.  Cardiovascular: Normal rate and regular  rhythm.  Pulmonary/Chest: Effort normal and breath sounds normal. No respiratory distress.  Abdominal: Soft. Bowel sounds are normal. She exhibits no distension. There is no abdominal tenderness. There is no rebound and no guarding.  Musculoskeletal: Normal range of motion.        General: No edema.  Neurological: She is alert and oriented to person, place, and time.  Skin: Skin is warm. No erythema.  Psychiatric: She has a normal mood and affect. Judgment and thought content normal.   GI:  Lab Results: Recent Labs    06/04/19 1343 06/05/19  0059 06/06/19 0423  WBC 6.8 4.2 6.2  HGB 12.8 10.1* 10.6*  HCT 37.0 30.5* 31.2*  PLT 249 168 192   BMET Recent Labs    06/04/19 1343 06/05/19 0059 06/06/19 0423  NA 140 139 137  K 2.0* 3.5 4.1  CL 110 116* 111  CO2 19* 17* 18*  GLUCOSE 91 81 124*  BUN 5* <5* <5*  CREATININE 0.38* 0.33* 0.47  CALCIUM 8.7* 7.9* 8.3*   LFT Recent Labs    06/06/19 0423  PROT 6.1*  ALBUMIN 3.5  AST 32  ALT 37  ALKPHOS 60  BILITOT 1.2   PT/INR No results for input(s): LABPROT, INR in the last 72 hours.   Studies/Results: No results found.  Impression/Plan: -Chronic diarrhea since cholecystectomy in 2014.  Negative EGD and colonoscopy in June 2019 at [facility] including biopsies negative for celiac and microscopic colitis.  Negative GI pathogen panel in the past. -Hypokalemia.  Could be from diarrhea.  Recommendations -------------------------- -She does not want to take cholestyramine powder but she is okay with colestipol pills. -Start colestipol 2 g twice a day.  Cut back to 2 g at night if starts having constipation.   -Started on Creon yesterday.  Check pancreatic elastase. -Continue dicyclomine.  GI will follow    LOS: 2 days   Kathi Der  MD, FACP 06/06/2019, 12:13 PM  Contact #  [phone]

## 2019-06-06 NOTE — Plan of Care (Signed)

## 2019-06-06 NOTE — Progress Notes (Signed)
PROGRESS NOTE    Sara Hill  LGX:[id number] DOB: 08-06-1991 DOA: 06/04/2019 PCP: Scot Jun, FNP   Brief Narrative:  HPI per [name] on 06/04/2019 Sara Hill is a 28 y.o. female with medical history significant of chronic malabsorption and diarrhea since cholecystectomy in 2015.  According to her fianc patient passed out while she was a passenger in the car.  She felt dizzy and passed out briefly.  She reports these episodes similar to prior episodes when she has low potassium.  Potassium is prescribed daily but she only takes it as needed.  She has chronic abdominal pain and chronic diarrhea.  Patient has history of syncopal episodes.  Her appetite is poor and she has not eaten anything in 2 days.  She is not taking her prescribed potassium supplements.  Denies fever or chills complains of nausea.  Denies urinary complaints.  Denies cough shortness of breath chest pain.  Denies headache changes with her vision.  She had a hospital admission in 01/30/2019 for hypokalemia and generalized weakness.  ED Course: Patient received IV potassium.  Blood pressure 93/65 pulse is 78 respiration 25 temperature 9899% on room air.  Sodium 140 potassium 2.0 BUN 5 creatinine 0.38 magnesium 1.7.  WBC 6.8 hemoglobin 12.8 platelet count 249.  UA with ketones moderate amount of blood  **Interim History Continues to have significant diarrhea and abdominal cramping so GI was consulted for further evaluation.  GI started her on colestipol pills and agreed with dicyclomine.  They are recommending checking a pancreatic elastase and continuing Creon.  Patient admitted to the nurse that she had some facial numbness and tingling but none to me.  Will need to continue to monitor closely.  Assessment & Plan:   Active Problems:   Hypokalemia   Chronic malnutrition (HCC)  Severe Hypokalemia, improved -In a patient with history of malabsorption and known history of hypokalemia.    -She is noncompliant to her medications.  On admission her potassium is 2.0.  This is aggressively being repleted. -Recheck levels after IV replacement.  Continue p.o. replacement. -Discontinued NS at 75 mL/hr + 40 mEQ and changed to Sodium Bicarbonate with 150 mEQ at 75 mL/hr;  -Admitted to telemetry. -Magnesium is also low replete and recheck. -Checked a lipase and was 20 -She has history of THC use check urine drug screen and it was + again  -HIV and RPR negative this year. -K+ this AM was 4.1 but continues to have Significant Diarrhea  Chronic Abdominal Pain and Chronic Diarrhea  -Patient is on Bentyl 20 mg 3 times a day at home continue.   -Continue Creon Questran Zofran; Does not like Questran but GI recommending Colestipol Pills 2 grams po BID -CT scan March 2020 shows bilateral nonobstructing renal calculi no definite acute intra-abdominal or intrapelvic abnormalities CT of the abdomen pelvis with contrast. -Since her abdomen is soft and nontender I am not going to get a CT of her abdomen at this time but if worsens will obtain CT Scan -Continues to Have Significant Diarrhea; Nursing reported  Multiple BM's yesterday and at at least 2 today  -Seen by [facility] for Chronic Diarrhea and has had an EGD and Colonoscopy in the past -Have not checked a GI Pathogen Panel this visit but may consider if not improving -Advance Diet to a Soft Diet if tolerated -GI was consulted and appreciate further evaluation and recommendations   Chronic malnutrition due to malabsorption  -Donsulted Dietitian for further evaluation -C/w Ensure Enlive po  TID supplementation   Hypotension  -Normal saline to be changed as above to Sodium Bicarbonate -BP on the lower side still   Syncope likely secondary to being orthostatic.   -She is too weak to stand to check orthostatics but they have been ordered  and they are normal -PT evaluated and improved  -In addition to that she has severe  hypokalemia and hypomagnesemia with muscle weakness. -Patient had a "numbness and tingling" episode today and stated to the nurse it felt like the episode she had prior to passing out in her car for which she was brought in for but denied it to me.  -Continue to Monitor and Evaluate  -May consider an ECHOCardiogram   Hypophosphatemia -Mild at 2.1 and now 3.1 -Continue to Monitor and Replete as Necessary -Repeat CMP in AM  Metabolic Acidosis  -Patient CO2 was 17, chloride of 116, and anion gap was 6; Now CO2 is 18, AG is 8, and Chloride is 111 -In the setting of diarrhea -Supplemented  with sodium bicarb tablets 650 mg p.o. 3 times daily but ineffective so will change to a Bicarbonate gtt with 150 mEQ Sodium Bicarbonate at 75 mL/hr -Continue monitor and trend and repeat CMP in a.m.  Normocytic Anemia Plan patient's hemoglobin/hematocrit dropped from 12.8/37.0 and is now 10.6/31.2 -Check anemia panel showed an iron level of 163, U IBC of 2 3, TIBC of 366, saturation ratios of 45%, ferritin level 24, folate level 3.5 g vitamin B12 of 340 -Continue to monitor for signs and symptoms of bleeding; currently no overt bleeding noted -Continue to monitor and repeat CBC in a.m.  DVT prophylaxis: Enoxaparin 30 mg sq q24h Code Status: FULL CODE  Family Communication: No family present ate bedside  Disposition Plan: Pending clinical improvement but anticipating discharging home in the next 24 to 48 hours if no longer orthostatic and electrolytes and diarrhea is improved  Consultants:   Gastroenterology   Procedures: None  Antimicrobials:  Anti-infectives (From admission, onward)   None     Subjective: Seen and examined and she still having significant amount of diarrhea and states that she vomited multiple times yesterday and had at least a bowel movement.  No chest pain, lightheadedness or dizziness but nursing did report that she had some numbness and tingling and felt like she is going  to pass out like she did when she presented but denied this to me.  Because of lack of improvement in her diarrhea will have GI further evaluate.  No other concerns or plans at this time.  Objective: Vitals:   06/05/19 1315 06/05/19 2136 06/06/19 0508 06/06/19 1259  BP: 104/69 (!) 96/58 94/63 100/66  Pulse: 64 67 86 79  Resp:  18 18 18   Temp:  98.2 F (36.8 C) 98.1 F (36.7 C) 99.3 F (37.4 C)  TempSrc:  Oral Oral Oral  SpO2: 100% 100% 99% 100%  Weight:      Height:        Intake/Output Summary (Last 24 hours) at 06/06/2019 1351 Last data filed at 06/06/2019 0600 Gross per 24 hour  Intake 900 ml  Output -  Net 900 ml   Filed Weights   06/04/19 1837  Weight: 36.4 kg   Examination: Physical Exam:  Constitutional: Extremely thin Female in NAD and appears a little anxious and uncomfortable but not as jittery today  Eyes: Lids and conjunctivae normal, sclerae anicteric  ENMT: External Ears, Nose appear normal. Grossly normal hearing. Mucous membranes are moist.  Neck: Appears  normal, supple, no cervical masses, normal ROM, no appreciable thyromegaly; no JVD Respiratory: Clear to auscultation bilaterally, no wheezing, rales, rhonchi or crackles. Normal respiratory effort and patient is not tachypenic. No accessory muscle use.  Cardiovascular: RRR, no murmurs / rubs / gallops. S1 and S2 auscultated. No extremity edema. Abdomen: Soft, mildly tender, non-distended. Bowel sounds positive x4.  GU: Deferred. Musculoskeletal: No clubbing / cyanosis of digits/nails. No joint deformity upper and lower extremities.   Skin: No rashes, lesions, ulcers on a limited skin evaluation. No induration; Warm and dry.  Neurologic: CN 2-12 grossly intact with no focal deficits. Romberg sign and cerebellar reflexes not assessed.  Psychiatric: Normal judgment and insight. Alert and oriented x 3. Anxious mood and appropriate affect.   Data Reviewed: I have personally reviewed following labs and imaging  studies  CBC: Recent Labs  Lab 06/04/19 1343 06/05/19 0059 06/06/19 0423  WBC 6.8 4.2 6.2  NEUTROABS  --   --  4.2  HGB 12.8 10.1* 10.6*  HCT 37.0 30.5* 31.2*  MCV 83.7 87.6 86.2  PLT 249 168 192   Basic Metabolic Panel: Recent Labs  Lab 06/04/19 1343 06/05/19 0059 06/05/19 0924 06/06/19 0423  NA 140 139  --  137  K 2.0* 3.5  --  4.1  CL 110 116*  --  111  CO2 19* 17*  --  18*  GLUCOSE 91 81  --  124*  BUN 5* <5*  --  <5*  CREATININE 0.38* 0.33*  --  0.47  CALCIUM 8.7* 7.9*  --  8.3*  MG 1.7  --  1.8 1.9  PHOS  --   --  2.1* 3.1   GFR: Estimated Creatinine Clearance: 59.6 mL/min (by C-G formula based on SCr of 0.47 mg/dL). Liver Function Tests: Recent Labs  Lab 06/04/19 1343 06/05/19 0059 06/06/19 0423  AST 27 33 32  ALT 40 41 37  ALKPHOS 70 54 60  BILITOT 1.5* 1.1 1.2  PROT 8.0 5.9* 6.1*  ALBUMIN 4.4 3.3* 3.5   Recent Labs  Lab 06/04/19 1343 06/05/19 0059  LIPASE  --  20  AMYLASE 25*  --    No results for input(s): AMMONIA in the last 168 hours. Coagulation Profile: No results for input(s): INR, PROTIME in the last 168 hours. Cardiac Enzymes: No results for input(s): CKTOTAL, CKMB, CKMBINDEX, TROPONINI in the last 168 hours. BNP (last 3 results) No results for input(s): PROBNP in the last 8760 hours. HbA1C: No results for input(s): HGBA1C in the last 72 hours. CBG: No results for input(s): GLUCAP in the last 168 hours. Lipid Profile: No results for input(s): CHOL, HDL, LDLCALC, TRIG, CHOLHDL, LDLDIRECT in the last 72 hours. Thyroid Function Tests: No results for input(s): TSH, T4TOTAL, FREET4, T3FREE, THYROIDAB in the last 72 hours. Anemia Panel: Recent Labs    06/05/19 0924  VITAMINB12 342  FOLATE 3.5*  FERRITIN 24  TIBC 366  IRON 163  RETICCTPCT 1.5   Sepsis Labs: No results for input(s): PROCALCITON, LATICACIDVEN in the last 168 hours.  Recent Results (from the past 240 hour(s))  SARS CORONAVIRUS 2 (TAT 6-24 HRS) Nasopharyngeal  Nasopharyngeal Swab     Status: None   Collection Time: 06/04/19  3:35 PM   Specimen: Nasopharyngeal Swab  Result Value Ref Range Status   SARS Coronavirus 2 NEGATIVE NEGATIVE Final    Comment: (NOTE) SARS-CoV-2 target nucleic acids are NOT DETECTED. The SARS-CoV-2 RNA is generally detectable in upper and lower respiratory specimens during the acute phase  of infection. Negative results do not preclude SARS-CoV-2 infection, do not rule out co-infections with other pathogens, and should not be used as the sole basis for treatment or other patient management decisions. Negative results must be combined with clinical observations, patient history, and epidemiological information. The expected result is Negative. Fact Sheet for Patients: HairSlick.no Fact Sheet for Healthcare Providers: quierodirigir.com This test is not yet approved or cleared by the [place] FDA and  has been authorized for detection and/or diagnosis of SARS-CoV-2 by FDA under an Emergency Use Authorization (EUA). This EUA will remain  in effect (meaning this test can be used) for the duration of the COVID-19 declaration under Section 56 4(b)(1) of the Act, 21 U.S.C. section 360bbb-3(b)(1), unless the authorization is terminated or revoked sooner. Performed at [facility], [address] [place], [place] [address]     Radiology Studies: No results found.  Scheduled Meds: . colestipol  2 g Oral BID  . dicyclomine  20 mg Oral TID AC  . enoxaparin (LOVENOX) injection  30 mg Subcutaneous Q24H  . feeding supplement (ENSURE ENLIVE)  237 mL Oral TID BM  .  HYDROmorphone (DILAUDID) injection  0.5 mg Intravenous Once  . lipase/protease/amylase  24,000 Units Oral TID WC  . mouth rinse  15 mL Mouth Rinse BID  . multivitamin with minerals  1 tablet Oral Daily  . ondansetron (ZOFRAN) IV  4 mg Intravenous Q8H   Continuous Infusions: . sodium bicarbonate  150 mEq in dextrose 5% 1000 mL 150 mEq (06/06/19 0949)    LOS: 2 days   Merlene Laughter, DO [facility] PAGER is on AMION  If 7PM-7AM, please contact night-coverage [email or web address] Password [email or web address] 06/06/2019, 1:51 PM

## 2019-06-07 DIAGNOSIS — R197 Diarrhea, unspecified: Secondary | ICD-10-CM

## 2019-06-07 LAB — CBC WITH DIFFERENTIAL/PLATELET
Abs Immature Granulocytes: 0.02 10*3/uL (ref 0.00–0.07)
Basophils Absolute: 0 10*3/uL (ref 0.0–0.1)
Basophils Relative: 1 %
Eosinophils Absolute: 0 10*3/uL (ref 0.0–0.5)
Eosinophils Relative: 1 %
HCT: 30.6 % — ABNORMAL LOW (ref 36.0–46.0)
Hemoglobin: 10.4 g/dL — ABNORMAL LOW (ref 12.0–15.0)
Immature Granulocytes: 0 %
Lymphocytes Relative: 24 %
Lymphs Abs: 1.5 10*3/uL (ref 0.7–4.0)
MCH: 28.7 pg (ref 26.0–34.0)
MCHC: 34 g/dL (ref 30.0–36.0)
MCV: 84.5 fL (ref 80.0–100.0)
Monocytes Absolute: 0.5 10*3/uL (ref 0.1–1.0)
Monocytes Relative: 8 %
Neutro Abs: 4 10*3/uL (ref 1.7–7.7)
Neutrophils Relative %: 66 %
Platelets: 187 10*3/uL (ref 150–400)
RBC: 3.62 MIL/uL — ABNORMAL LOW (ref 3.87–5.11)
RDW: 15 % (ref 11.5–15.5)
WBC: 6 10*3/uL (ref 4.0–10.5)
nRBC: 0 % (ref 0.0–0.2)

## 2019-06-07 LAB — COMPREHENSIVE METABOLIC PANEL
ALT: 31 U/L (ref 0–44)
AST: 17 U/L (ref 15–41)
Albumin: 3.4 g/dL — ABNORMAL LOW (ref 3.5–5.0)
Alkaline Phosphatase: 53 U/L (ref 38–126)
Anion gap: 9 (ref 5–15)
BUN: <5 mg/dL — ABNORMAL LOW (ref 6–20)
CO2: 31 mmol/L (ref 22–32)
Calcium: 8.5 mg/dL — ABNORMAL LOW (ref 8.9–10.3)
Chloride: 98 mmol/L (ref 98–111)
Creatinine, Ser: 0.38 mg/dL — ABNORMAL LOW (ref 0.44–1.00)
GFR calc Af Amer: >60 mL/min (ref >60)
GFR calc non Af Amer: >60 mL/min (ref >60)
Glucose, Bld: 101 mg/dL — ABNORMAL HIGH (ref 70–99)
Potassium: 2.6 mmol/L — CL (ref 3.5–5.1)
Sodium: 138 mmol/L (ref 135–145)
Total Bilirubin: 1 mg/dL (ref 0.3–1.2)
Total Protein: 6 g/dL — ABNORMAL LOW (ref 6.5–8.1)

## 2019-06-07 LAB — PHOSPHORUS: Phosphorus: 4.4 mg/dL (ref 2.5–4.6)

## 2019-06-07 LAB — MAGNESIUM: Magnesium: 1.7 mg/dL (ref 1.7–2.4)

## 2019-06-07 MED ORDER — COLESTIPOL HCL 1 G PO TABS
2.0000 g | ORAL_TABLET | Freq: Every day | ORAL | Status: DC
  Administered 2019-06-07: 2 g via ORAL
  Filled 2019-06-07 (×2): qty 2

## 2019-06-07 MED ORDER — MAGNESIUM SULFATE 2 GM/50ML IV SOLN
2.0000 g | Freq: Once | INTRAVENOUS | Status: AC
  Administered 2019-06-07: 2 g via INTRAVENOUS
  Filled 2019-06-07: qty 50

## 2019-06-07 MED ORDER — BUTALBITAL-APAP-CAFFEINE 50-325-40 MG PO TABS
1.0000 | ORAL_TABLET | Freq: Four times a day (QID) | ORAL | Status: DC | PRN
  Administered 2019-06-07: 1 via ORAL
  Filled 2019-06-07: qty 1

## 2019-06-07 MED ORDER — HYDROMORPHONE HCL 1 MG/ML IJ SOLN
0.5000 mg | INTRAMUSCULAR | Status: DC | PRN
  Administered 2019-06-07 – 2019-06-08 (×2): 0.5 mg via INTRAVENOUS
  Filled 2019-06-07 (×2): qty 1

## 2019-06-07 MED ORDER — POTASSIUM CHLORIDE CRYS ER 20 MEQ PO TBCR
40.0000 meq | EXTENDED_RELEASE_TABLET | Freq: Two times a day (BID) | ORAL | Status: AC
  Administered 2019-06-07 (×2): 40 meq via ORAL
  Filled 2019-06-07 (×2): qty 2

## 2019-06-07 MED ORDER — POTASSIUM CHLORIDE 10 MEQ/100ML IV SOLN
10.0000 meq | INTRAVENOUS | Status: AC
  Administered 2019-06-07 (×3): 10 meq via INTRAVENOUS
  Filled 2019-06-07 (×3): qty 100

## 2019-06-07 MED ORDER — POTASSIUM CHLORIDE CRYS ER 20 MEQ PO TBCR
30.0000 meq | EXTENDED_RELEASE_TABLET | Freq: Once | ORAL | Status: AC
  Administered 2019-06-07: 30 meq via ORAL
  Filled 2019-06-07: qty 1

## 2019-06-07 MED ORDER — POTASSIUM CHLORIDE IN NACL 40-0.9 MEQ/L-% IV SOLN
INTRAVENOUS | Status: DC
  Administered 2019-06-07 – 2019-06-08 (×2): 75 mL/h via INTRAVENOUS
  Filled 2019-06-07 (×2): qty 1000

## 2019-06-07 NOTE — TOC Initial Note (Signed)
Transition of Care [facility]) - Initial/Assessment Note    Patient Details  Name: Sara Hill MRN: [id number] Date of Birth: 1991-03-03  [facility] CM/SW Contact:    Purcell Mouton, RN Phone Number: 06/07/2019, 2:53 PM  Clinical Narrative:                 Plan to discharge home with no needs at present time.   Expected Discharge Plan: Home/Self Care Barriers to Discharge: Inadequate or no insurance   Patient Goals and CMS Choice Patient states their goals for this hospitalization and ongoing recovery are:: To feel better and go home.      Expected Discharge Plan and Services Expected Discharge Plan: Home/Self Care   Discharge Planning Services: CM Consult   Living arrangements for the past 2 months: Single Family Home                                      Prior Living Arrangements/Services Living arrangements for the past 2 months: Single Family Home Lives with:: Significant Other, Relatives Patient language and need for interpreter reviewed:: No Do you feel safe going back to the place where you live?: Yes               Activities of Daily Living Home Assistive Devices/Equipment: None ADL Screening (condition at time of admission) Patient's cognitive ability adequate to safely complete daily activities?: Yes Is the patient deaf or have difficulty hearing?: No Does the patient have difficulty seeing, even when wearing glasses/contacts?: No Does the patient have difficulty concentrating, remembering, or making decisions?: No Patient able to express need for assistance with ADLs?: Yes Does the patient have difficulty dressing or bathing?: No Independently performs ADLs?: Yes (appropriate for developmental age) Does the patient have difficulty walking or climbing stairs?: Yes(secondary to weakness) Weakness of Legs: Both Weakness of Arms/Hands: None  Permission Sought/Granted Permission sought to share information with : Case  Manager                Emotional Assessment Appearance:: Appears older than stated age, Appears stated age, Appears younger than stated age     Orientation: : Oriented to Self, Oriented to Place, Oriented to  Time, Oriented to Situation      Admission diagnosis:  Hypokalemia [E87.6] Weakness [R53.1] Patient Active Problem List   Diagnosis Date Noted  . Chronic malnutrition ([place]) 06/04/2019  . Chronic diarrhea   . Does not have health insurance   . Acute hypokalemia 01/26/2019  . Dehydration 11/01/2018  . Diarrhea 11/01/2018  . Abdominal pain 11/01/2018  . Weakness 11/01/2018  . Nausea and vomiting   . Severe protein-calorie malnutrition ([place])   . Protein-calorie malnutrition, severe 06/29/2018  . Hypokalemia 06/27/2018   PCP:  Scot Jun, FNP Pharmacy:   [id number], [place] [address] Phone: [phone] Fax: [phone]  [id number] - [place], [place] [address] [place] [address] Phone: [phone] Fax: [place], [place] - [address] [place] [address] Phone: [phone] Fax: [phone]     Social Determinants of Health (SDOH) Interventions    Readmission Risk Interventions No flowsheet data found.

## 2019-06-07 NOTE — Progress Notes (Signed)
CRITICAL VALUE ALERT  Critical Value:  K 2.6  Date & Time Notied:  06/07/19  Provider Notified: Maudie Mercury, MD  Orders Received/Actions taken: Awaiting new orders.

## 2019-06-07 NOTE — Progress Notes (Signed)
PROGRESS NOTE    Sara Hill  KVQ:[id number] DOB: [birth year] DOA: 06/04/2019 PCP: Sara Jun, FNP   Brief Narrative:  HPI per Sara Hill on 06/04/2019 Sara Hill is a 28 y.o. female with medical history significant of chronic malabsorption and diarrhea since cholecystectomy in 2015.  According to her fianc patient passed out while she was a passenger in the car.  She felt dizzy and passed out briefly.  She reports these episodes similar to prior episodes when she has low potassium.  Potassium is prescribed daily but she only takes it as needed.  She has chronic abdominal pain and chronic diarrhea.  Patient has history of syncopal episodes.  Her appetite is poor and she has not eaten anything in 2 days.  She is not taking her prescribed potassium supplements.  Denies fever or chills complains of nausea.  Denies urinary complaints.  Denies cough shortness of breath chest pain.  Denies headache changes with her vision.  She had a hospital admission in 01/30/2019 for hypokalemia and generalized weakness.  ED Course: Patient received IV potassium.  Blood pressure 93/65 pulse is 78 respiration 25 temperature 9899% on room air.  Sodium 140 potassium 2.0 BUN 5 creatinine 0.38 magnesium 1.7.  WBC 6.8 hemoglobin 12.8 platelet count 249.  UA with ketones moderate amount of blood  **Interim History Continued to have significant diarrhea and abdominal cramping so GI was consulted for further evaluation.  GI started her on colestipol pills and agreed with dicyclomine.  They are recommending checking a pancreatic elastase and continuing Creon.  Patient admitted to the nurse that she had some facial numbness and tingling but none to me.  Patient today stated that her eye was hurting from a headache so we will try Fioricet.  Stated that her eye was twitching but I cannot really see what it was.  Abdominal cramping is improved and diarrhea is resolved after starting Colestipol.    Assessment & Plan:   Active Problems:   Hypokalemia   Chronic malnutrition (HCC)  Severe Hypokalemia -In a patient with history of malabsorption and known history of hypokalemia.   -She is noncompliant to her medications.  On admission her potassium is 2.0.  This is aggressively being repleted. -Recheck levels after IV replacement.  Continue p.o. replacement. -Restarted NS at 75 mL/hr + 40 mEQ and discontinued IVF with Sodium Bicarbonate -Admitted to telemetry. -Magnesium is also low replete and recheck. -Checked a lipase and was 20 -She has history of THC use check urine drug screen and it was + again  -HIV and RPR negative this year. -K+ this AM was 2.6 but diarrhea has resolved   Chronic Abdominal Pain and Chronic Diarrhea  -Patient is on Bentyl 20 mg 3 times a day at home continue.   -Continue Creon Questran Zofran; Does not like Questran but GI recommending Colestipol Pills 2 grams po BID have now changed to 2 g nightly -CT scan March 2020 shows bilateral nonobstructing renal calculi no definite acute intra-abdominal or intrapelvic abnormalities CT of the abdomen pelvis with contrast. -Since her abdomen is soft and nontender I am not going to get a CT of her abdomen at this time but if worsens will obtain CT Scan -Continues to Have Significant Diarrhea; Nursing reported  Multiple BM's yesterday and at at least 2 today  -Seen by [place] for Chronic Diarrhea and has had an EGD and Colonoscopy in the past -Have not checked a GI Pathogen Panel this visit but may consider if  not improving -Advance Diet to a Soft Diet if tolerated -GI was consulted and appreciate further evaluation and recommendations her abdominal pain is improved and diarrhea has resolved and they recommend no further inpatient GI work-up and recommend [facility] after [facility]  Chronic malnutrition due to malabsorption  -Donsulted Dietitian for further evaluation -C/w  Ensure Enlive po TID supplementation   Hypotension  -Normal saline to be changed as above to Sodium Bicarbonate -BP on the lower side still and last value was 107/70  Syncope likely secondary to being orthostatic.   -She is too weak to stand to check orthostatics but they have been ordered  and they are normal -PT evaluated and improved  -In addition to that she has severe hypokalemia and hypomagnesemia with muscle weakness. -Patient had a "numbness and tingling" episode today and stated to the nurse it felt like the episode she had prior to passing out in her car for which she was brought in for but denied it to me.  -Continue to Monitor and Evaluate  -May consider an ECHOCardiogram  -continue IV fluid hydration and recheck orthostatics in a.m.  Hypophosphatemia -Mild at 2.1 and now 4.1 -Continue to Monitor and Replete as Necessary -Repeat CMP in AM  Metabolic Acidosis  -Patient CO2 was 17, chloride of 116, and anion gap was 6; Now CO2 is 31, AG is 9, and Chloride is 90 -In the setting of diarrhea -Sodium bicarbonate now stopped and will continue sodium  Normocytic Anemia Plan patient's hemoglobin/hematocrit dropped from 12.8/37.0 and is now 10.4/30.6 -Check anemia panel showed an iron level of 163, U IBC of 2 3, TIBC of 366, saturation ratios of 45%, ferritin level 24, folate level 3.5 g vitamin B12 of 340 -Continue to monitor for signs and symptoms of bleeding; currently no overt bleeding noted -Continue to monitor and repeat CBC in a.m.  Headache -Continue with acetaminophen and will try Fioricet -Continue to monitor  Eye twitching -Patient states that her eyes been twitching and likely the setting of a headache -Continue to monitor and evaluate close  DVT prophylaxis: Enoxaparin 30 mg sq q24h Code Status: FULL CODE  Family Communication: No family present ate bedside  Disposition Plan: Pending clinical improvement but anticipating discharging home in the next 24 to  48 hours if no longer orthostatic and electrolytes and diarrhea is improved  Consultants:   Gastroenterology   Procedures: None  Antimicrobials:  Anti-infectives (From admission, onward)   None     Subjective: Seen and examined and states that her head was hurting and that she never gets headaches and also states that her eyes started twitching because of her headaches.  No nausea or vomiting.  States abdominal pain is improved and states that the diarrhea is improved significantly.  Has not had a bowel movement today.  No other concerns or complaints at this time.  Objective: Vitals:   06/06/19 1259 06/06/19 2126 06/07/19 0606 06/07/19 1252  BP: 100/66 96/64 (!) 93/59 107/71  Pulse: 79 72 72 67  Resp: 18 18 16 20   Temp: 99.3 F (37.4 C) 98.6 F (37 C) 98.2 F (36.8 C) 98.4 F (36.9 C)  TempSrc: Oral Oral Oral Oral  SpO2: 100% 100% 100% 100%  Weight:      Height:        Intake/Output Summary (Last 24 hours) at 06/07/2019 1906 Last data filed at 06/07/2019 1852 Gross per 24 hour  Intake 2930 ml  Output -  Net  2930 ml   Filed Weights   06/04/19 1837  Weight: 36.4 kg   Examination: Physical Exam:  Constitutional: Extremely thin female in NAD and appears calm  Eyes: Lids and conjunctivae normal, sclerae anicteric  ENMT: External Ears, Nose appear normal. Grossly normal hearing.  Neck: Appears normal, supple, no cervical masses, normal ROM, no appreciable thyromegaly; no JVD Respiratory: Clear to auscultation bilaterally, no wheezing, rales, rhonchi or crackles. Normal respiratory effort and patient is not tachypenic. No accessory muscle use.  Cardiovascular: RRR, no murmurs / rubs / gallops. S1 and S2 auscultated. No extremity edema. Abdomen: Soft, non-tender, non-distended.  Bowel sounds positive x4.  GU: Deferred. Musculoskeletal: No clubbing / cyanosis of digits/nails. No joint deformity upper and lower extremities.  Skin: No rashes, lesions, ulcers on a limited  skin evaluation. No induration; Warm and dry.  Neurologic: CN 2-12 grossly intact with no focal deficits. Romberg sign and cerebellar reflexes not assessed.  Psychiatric: Normal judgment and insight. Alert and oriented x 3. Normal mood and appropriate affect.   Data Reviewed: I have personally reviewed following labs and imaging studies  CBC: Recent Labs  Lab 06/04/19 1343 06/05/19 0059 06/06/19 0423 06/07/19 0417  WBC 6.8 4.2 6.2 6.0  NEUTROABS  --   --  4.2 4.0  HGB 12.8 10.1* 10.6* 10.4*  HCT 37.0 30.5* 31.2* 30.6*  MCV 83.7 87.6 86.2 84.5  PLT 249 168 192 187   Basic Metabolic Panel: Recent Labs  Lab 06/04/19 1343 06/05/19 0059 06/05/19 0924 06/06/19 0423 06/07/19 0417  NA 140 139  --  137 138  K 2.0* 3.5  --  4.1 2.6*  CL 110 116*  --  111 98  CO2 19* 17*  --  18* 31  GLUCOSE 91 81  --  124* 101*  BUN 5* <5*  --  <5* <5*  CREATININE 0.38* 0.33*  --  0.47 0.38*  CALCIUM 8.7* 7.9*  --  8.3* 8.5*  MG 1.7  --  1.8 1.9 1.7  PHOS  --   --  2.1* 3.1 4.4   GFR: Estimated Creatinine Clearance: 59.6 mL/min (A) (by C-G formula based on SCr of 0.38 mg/dL (L)). Liver Function Tests: Recent Labs  Lab 06/04/19 1343 06/05/19 0059 06/06/19 0423 06/07/19 0417  AST 27 33 32 17  ALT 40 41 37 31  ALKPHOS 70 54 60 53  BILITOT 1.5* 1.1 1.2 1.0  PROT 8.0 5.9* 6.1* 6.0*  ALBUMIN 4.4 3.3* 3.5 3.4*   Recent Labs  Lab 06/04/19 1343 06/05/19 0059  LIPASE  --  20  AMYLASE 25*  --    No results for input(s): AMMONIA in the last 168 hours. Coagulation Profile: No results for input(s): INR, PROTIME in the last 168 hours. Cardiac Enzymes: No results for input(s): CKTOTAL, CKMB, CKMBINDEX, TROPONINI in the last 168 hours. BNP (last 3 results) No results for input(s): PROBNP in the last 8760 hours. HbA1C: No results for input(s): HGBA1C in the last 72 hours. CBG: No results for input(s): GLUCAP in the last 168 hours. Lipid Profile: No results for input(s): CHOL, HDL,  LDLCALC, TRIG, CHOLHDL, LDLDIRECT in the last 72 hours. Thyroid Function Tests: No results for input(s): TSH, T4TOTAL, FREET4, T3FREE, THYROIDAB in the last 72 hours. Anemia Panel: Recent Labs    06/05/19 0924  VITAMINB12 342  FOLATE 3.5*  FERRITIN 24  TIBC 366  IRON 163  RETICCTPCT 1.5   Sepsis Labs: No results for input(s): PROCALCITON, LATICACIDVEN in the last 168 hours.  Recent Results (from the past 240 hour(s))  SARS CORONAVIRUS 2 (TAT 6-24 HRS) Nasopharyngeal Nasopharyngeal Swab     Status: None   Collection Time: 06/04/19  3:35 PM   Specimen: Nasopharyngeal Swab  Result Value Ref Range Status   SARS Coronavirus 2 NEGATIVE NEGATIVE Final    Comment: (NOTE) SARS-CoV-2 target nucleic acids are NOT DETECTED. The SARS-CoV-2 RNA is generally detectable in upper and lower respiratory specimens during the acute phase of infection. Negative results do not preclude SARS-CoV-2 infection, do not rule out co-infections with other pathogens, and should not be used as the sole basis for treatment or other patient management decisions. Negative results must be combined with clinical observations, patient history, and epidemiological information. The expected result is Negative. Fact Sheet for Patients: HairSlick.no Fact Sheet for Healthcare Providers: quierodirigir.com This test is not yet approved or cleared by the [place] FDA and  has been authorized for detection and/or diagnosis of SARS-CoV-2 by FDA under an Emergency Use Authorization (EUA). This EUA will remain  in effect (meaning this test can be used) for the duration of the COVID-19 declaration under Section 56 4(b)(1) of the Act, 21 U.S.C. section 360bbb-3(b)(1), unless the authorization is terminated or revoked sooner. Performed at [facility], [address], [place], [place] [address]     Radiology Studies: No results found.  Scheduled Meds: .  colestipol  2 g Oral QHS  . dicyclomine  20 mg Oral TID AC  . enoxaparin (LOVENOX) injection  30 mg Subcutaneous Q24H  . feeding supplement (ENSURE ENLIVE)  237 mL Oral TID BM  .  HYDROmorphone (DILAUDID) injection  0.5 mg Intravenous Once  . lipase/protease/amylase  24,000 Units Oral TID WC  . mouth rinse  15 mL Mouth Rinse BID  . multivitamin with minerals  1 tablet Oral Daily  . ondansetron (ZOFRAN) IV  4 mg Intravenous Q8H  . potassium chloride  40 mEq Oral BID   Continuous Infusions: . 0.9 % NaCl with KCl 40 mEq / L 75 mL/hr (06/07/19 0933)    LOS: 3 days   Merlene Laughter, DO [facility] PAGER is on AMION  If 7PM-7AM, please contact night-coverage [email or web address] Password [email or web address] 06/07/2019, 7:06 PM

## 2019-06-07 NOTE — Progress Notes (Signed)
[facility] Gastroenterology Progress Note  Sara Hill 28 y.o. 1991-03-18  CC: Chronic diarrhea   Subjective: Diarrhea improved.  No bowel movement since yesterday evening.  Complaining of generalized discomfort.  Complaining of nausea but denies any vomiting.  ROS : Afebrile.  Denies any chest pain.   Objective: Vital signs in last 24 hours: Vitals:   06/06/19 2126 06/07/19 0606  BP: 96/64 (!) 93/59  Pulse: 72 72  Resp: 18 16  Temp: 98.6 F (37 C) 98.2 F (36.8 C)  SpO2: 100% 100%    Physical Exam:  General:   Thin appearing patient.  Not in acute distress  Head:  Normocephalic, without obvious abnormality, atraumatic  Eyes:  , EOM's intact,   Lungs:   Clear to auscultation bilaterally, respirations unlabored  Heart:  Regular rate and rhythm, S1, S2 normal  Abdomen:   Soft, non-tender, bowel sounds active all four quadrants,  no masses,   Extremities: Extremities normal, atraumatic, no  edema  Pulses: 2+ and symmetric    Lab Results: Recent Labs    06/06/19 0423 06/07/19 0417  NA 137 138  K 4.1 2.6*  CL 111 98  CO2 18* 31  GLUCOSE 124* 101*  BUN <5* <5*  CREATININE 0.47 0.38*  CALCIUM 8.3* 8.5*  MG 1.9 1.7  PHOS 3.1 4.4   Recent Labs    06/06/19 0423 06/07/19 0417  AST 32 17  ALT 37 31  ALKPHOS 60 53  BILITOT 1.2 1.0  PROT 6.1* 6.0*  ALBUMIN 3.5 3.4*   Recent Labs    06/06/19 0423 06/07/19 0417  WBC 6.2 6.0  NEUTROABS 4.2 4.0  HGB 10.6* 10.4*  HCT 31.2* 30.6*  MCV 86.2 84.5  PLT 192 187   No results for input(s): LABPROT, INR in the last 72 hours.    Assessment/Plan: -Chronic diarrhea since cholecystectomy in 2014.  Negative EGD and colonoscopy in June 2019 at [facility] including biopsies negative for celiac and microscopic colitis.  Negative GI pathogen panel in the past.  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 -Hypokalemia.  Could be from diarrhea. -Marijuana use.  Recommendations -------------------------- -Diarrhea resolved  with Colestid.  Decrease Colestid to 2 g at night. -DC pancreatic elastase. -No further inpatient GI work-up planned. -Follow-up with primary GI at [facility] or [facility] after discharge. -GI will sign off.  Call us back if needed   Otis Brace MD, [place] 06/07/2019, 9:31 AM  Contact #  [phone]

## 2019-06-07 NOTE — Plan of Care (Signed)

## 2019-06-08 ENCOUNTER — Inpatient Hospital Stay (HOSPITAL_COMMUNITY): Payer: Self-pay

## 2019-06-08 DIAGNOSIS — R945 Abnormal results of liver function studies: Secondary | ICD-10-CM

## 2019-06-08 LAB — CBC WITH DIFFERENTIAL/PLATELET
Abs Immature Granulocytes: 0.03 10*3/uL (ref 0.00–0.07)
Basophils Absolute: 0 10*3/uL (ref 0.0–0.1)
Basophils Relative: 1 %
Eosinophils Absolute: 0.1 10*3/uL (ref 0.0–0.5)
Eosinophils Relative: 1 %
HCT: 37 % (ref 36.0–46.0)
Hemoglobin: 12.3 g/dL (ref 12.0–15.0)
Immature Granulocytes: 0 %
Lymphocytes Relative: 20 %
Lymphs Abs: 1.4 10*3/uL (ref 0.7–4.0)
MCH: 28.7 pg (ref 26.0–34.0)
MCHC: 33.2 g/dL (ref 30.0–36.0)
MCV: 86.2 fL (ref 80.0–100.0)
Monocytes Absolute: 0.6 10*3/uL (ref 0.1–1.0)
Monocytes Relative: 9 %
Neutro Abs: 5 10*3/uL (ref 1.7–7.7)
Neutrophils Relative %: 69 %
Platelets: 209 10*3/uL (ref 150–400)
RBC: 4.29 MIL/uL (ref 3.87–5.11)
RDW: 15.3 % (ref 11.5–15.5)
WBC: 7.1 10*3/uL (ref 4.0–10.5)
nRBC: 0 % (ref 0.0–0.2)

## 2019-06-08 LAB — COMPREHENSIVE METABOLIC PANEL
ALT: 63 U/L — ABNORMAL HIGH (ref 0–44)
AST: 48 U/L — ABNORMAL HIGH (ref 15–41)
Albumin: 3.9 g/dL (ref 3.5–5.0)
Alkaline Phosphatase: 74 U/L (ref 38–126)
Anion gap: 7 (ref 5–15)
BUN: <5 mg/dL — ABNORMAL LOW (ref 6–20)
CO2: 27 mmol/L (ref 22–32)
Calcium: 9.2 mg/dL (ref 8.9–10.3)
Chloride: 103 mmol/L (ref 98–111)
Creatinine, Ser: 0.37 mg/dL — ABNORMAL LOW (ref 0.44–1.00)
GFR calc Af Amer: >60 mL/min (ref >60)
GFR calc non Af Amer: >60 mL/min (ref >60)
Glucose, Bld: 84 mg/dL (ref 70–99)
Potassium: 4.6 mmol/L (ref 3.5–5.1)
Sodium: 137 mmol/L (ref 135–145)
Total Bilirubin: 1.1 mg/dL (ref 0.3–1.2)
Total Protein: 7.2 g/dL (ref 6.5–8.1)

## 2019-06-08 LAB — HEPATITIS PANEL, ACUTE
HCV Ab: NONREACTIVE
Hep A IgM: NONREACTIVE
Hep B C IgM: NONREACTIVE
Hepatitis B Surface Ag: NONREACTIVE

## 2019-06-08 LAB — MAGNESIUM: Magnesium: 1.9 mg/dL (ref 1.7–2.4)

## 2019-06-08 LAB — PHOSPHORUS: Phosphorus: 3.9 mg/dL (ref 2.5–4.6)

## 2019-06-08 MED ORDER — COLESTIPOL HCL 1 G PO TABS: 2.0000 g | ORAL_TABLET | Freq: Every day | ORAL | 0 refills | Status: DC

## 2019-06-08 MED ORDER — ONDANSETRON HCL 4 MG PO TABS: 4.0000 mg | ORAL_TABLET | Freq: Three times a day (TID) | ORAL | 0 refills | Status: DC | PRN

## 2019-06-08 NOTE — Plan of Care (Signed)
  Problem: Education: Goal: Knowledge of General Education information will improve Description: Including pain rating scale, medication(s)/side effects and non-pharmacologic comfort measures Outcome: Completed/Met   Problem: Clinical Measurements: Goal: Ability to maintain clinical measurements within normal limits will improve Outcome: Completed/Met Goal: Will remain free from infection Outcome: Completed/Met Goal: Diagnostic test results will improve Outcome: Progressing Goal: Respiratory complications will improve Outcome: Completed/Met Goal: Cardiovascular complication will be avoided Outcome: Completed/Met   Problem: Activity: Goal: Risk for activity intolerance will decrease Outcome: Completed/Met   Problem: Nutrition: Goal: Adequate nutrition will be maintained Outcome: Progressing   Problem: Elimination: Goal: Will not experience complications related to bowel motility Outcome: Completed/Met Goal: Will not experience complications related to urinary retention Outcome: Completed/Met   Problem: Coping: Goal: Level of anxiety will decrease Outcome: Completed/Met   Problem: Pain Managment: Goal: General experience of comfort will improve Outcome: Completed/Met   Problem: Safety: Goal: Ability to remain free from injury will improve Outcome: Completed/Met   Problem: Skin Integrity: Goal: Risk for impaired skin integrity will decrease Outcome: Completed/Met

## 2019-06-08 NOTE — Progress Notes (Signed)
Pt discharged home today per Dr. Sheikh. Pt's IV site D/C'd and WDL. Pt's VSS. Pt provided with home medication list, discharge instructions and prescriptions. Verbalized understanding. Pt left floor via WC in stable condition accompanied by NT. 

## 2019-06-08 NOTE — Discharge Summary (Addendum)
Physician Discharge Summary  Sara Hill ZOX:[id number] DOB: 01/01/1991 DOA: 06/04/2019  PCP: Bing NeighborsHarris, Kimberly S, FNP  Admit date: 06/04/2019 Discharge date: 06/08/2019  Admitted From: Home Disposition: Home  Recommendations for Outpatient Follow-up:  1. Follow up with PCP in 1-2 weeks 2. Follow up with Gastroenterology within 1-2 weeks  3. Please obtain CMP/CBC, Mag, Phos in one week 4. Please follow up on the following pending results: Acute Hepatitis Panel   Home Health: No  Equipment/Devices: None    Discharge Condition: Stable CODE STATUS: FULL CODE Diet recommendation: Low Fat Diet   Brief/Interim Summary: HPI per Sara Hill on 06/04/2019 Sara Hill a 28 y.o.femalewith medical history significant ofchronic malabsorption and diarrhea since cholecystectomy in 2015.  According to her fianc patient passed out while she was a passenger in the car. She felt dizzy and passed out briefly. She reports these episodes similar to prior episodes when she has low potassium. Potassium is prescribed daily but she only takes it as needed. She has chronic abdominal pain and chronic diarrhea. Patient has history of syncopal episodes. Her appetite is poor and she has not eaten anything in 2 days. She is not taking her prescribed potassium supplements. Denies fever or chills complains of nausea. Denies urinary complaints. Denies cough shortness of breath chest pain. Denies headache changes with her vision.  She had a hospital admission in 01/30/2019 for hypokalemia and generalized weakness.  ED Course:Patient received IV potassium. Blood pressure 93/65 pulse is 78 respiration 25 temperature 9899% on room air. Sodium 140 potassium 2.0 BUN 5 creatinine 0.38 magnesium 1.7. WBC 6.8 hemoglobin 12.8 platelet count 249. UA with ketones moderate amount of blood  **Interim History Continued to have significant diarrhea and abdominal cramping so GI was  consulted for further evaluation.  GI started her on colestipol pills and agreed with dicyclomine.  They are recommending checking a pancreatic elastase and continuing Creon.  Patient admitted to the nurse that she had some facial numbness and tingling but none to me.  Patient today stated that her eye was hurting from a headache so we will try Fioricet.  Stated that her eye was twitching but I cannot really see what it was.  Abdominal cramping is improved and diarrhea is resolved after starting Colestipol.   She improved significantly and diarrhea had slowed down and she is stable for discharge and was not dizzy.  Eye twitching is also resolved.  She will need to follow-up with PCP and gastroenterology in outpatient setting and numbers were given for her for  Discharge Diagnoses:  Active Problems:   Hypokalemia   Chronic malnutrition (HCC)  Severe Hypokalemia -In a patient with history of malabsorption and known history of hypokalemia.  -She is noncompliant to her medications. On admission her potassium is 2.0. This is aggressively being repleted. -Recheck levels after IV replacement. Continue p.o. replacement. -Restarted NS at 75 mL/hr + 40 mEQ and discontinued IVF with Sodium Bicarbonate and now stopped -Admitted to telemetry. -Magnesium is also low replete and recheck. -Checked a lipase and was 20 -She has history of THC use check urine drug screen and it was + again  -HIV and RPR negative this year. -K+ this AM was  improved to 4.6 -We will need to continue home potassium supplementation  Chronic Abdominal Pain and Chronic Diarrhea  -Patient is on Bentyl 20 mg 3 times a day at home continue.  -Continue Creon Questran Zofran; Does not like Questran but GI recommending Colestipol Pills 2 grams po BID have now  changed to 2 g nightly -CT scan March 2020 shows bilateral nonobstructing renal calculi no definite acute intra-abdominal or intrapelvic abnormalities CT of the abdomen pelvis  with contrast. -Since her abdomen is soft and nontender I am not going to get a CT of her abdomen at this time but if worsens will obtain CT Scan -Continues to Have Significant Diarrhea; Nursing reported  Multiple BM's but improved after colestipol pills were reduced to 2 g nightly and they are stable now -Seen by [facility] for Chronic Diarrhea and has had an EGD and Colonoscopy in the past -Have not checked a GI Pathogen Panel this visit but may consider if not improving -Advance Diet to a Soft Diet if tolerated -GI was consulted and appreciate further evaluation and recommendations her abdominal pain is improved and diarrhea has resolved and they recommend no further inpatient GI work-up and recommend following up with GI at [facility] after discharge  Chronic malnutrition due to malabsorption  -Donsulted Dietitian for further evaluation -C/w Ensure Enlive po TID supplementation -Follow-up with gastroenterology in outpatient setting  Hypotension  -Normal saline to be changed as above to Sodium Bicarbonate -BP on the lower side still and last value was 104/67  Syncope likely secondary to being orthostatic, improved-She is too weak to stand to check orthostatics but they have been ordered and they are normal -PT evaluated and improved  -In addition to that she has severe hypokalemia and hypomagnesemia with muscle weakness. -Patient had a "numbness and tingling" episode today and stated to the nurse it felt like the episode she had prior to passing out in her car for which she was brought in for but denied it to me.  -Continue to Monitor and Evaluate  -May consider an ECHOCardiogram  -continue IV fluid hydration and recheck orthostatics in a.m. and she is not orthostatic and was deemed stable for discharge  Hypophosphatemia -Mild at 2.1 and now 4.6 -Continue to Monitor and Replete as Necessary -Repeat CMP in AM  Metabolic Acidosis  -Patient CO2 was 17, chloride of  116, and anion gap was 6; Now CO2 is  27, AG is  7, and Chloride is  103 -In the setting of diarrhea -Sodium bicarbonate now stopped and will continue sodium  Normocytic Anemia Plan patient's hemoglobin/hematocrit dropped from 12.8/37.0 and is now  12.3/37.0-Check anemia panel showed an iron level of 163, U IBC of 2 3, TIBC of 366, saturation ratios of 45%, ferritin level 24, folate level 3.5 g vitamin B12 of 340 -Continue to monitor for signs and symptoms of bleeding; currently no overt bleeding noted -Continue to monitor and repeat CBC in a.m.  Headache, improved -Continue with acetaminophen and will try Fioricet -Continue to monitor  Eye twitching, resolved -Patient states that her eyes been twitching and likely the setting of a headache -Continue to monitor and evaluate close  Abnormal LFTs -Slightly elevated -Checked RUQ and showed "Unremarkable echotexture of liver parenchyma. Surgically absent gallbladder. Relatively increased echogenicity of the right kidney compared to the liver parenchyma may indicate medical renal disease and correlation with lab values may be useful." -Check Acute Hepatitis Panel  -Continue to Monitor and Trend as an outpatient and Defer to PCP for further workup  Discharge Instructions  Discharge Instructions    Call MD for:  difficulty breathing, headache or visual disturbances   Complete by: As directed    Call MD for:  extreme fatigue   Complete by: As directed    Call MD for:  hives   Complete by: As directed    Call MD for:  persistant dizziness or light-headedness   Complete by: As directed    Call MD for:  persistant nausea and vomiting   Complete by: As directed    Call MD for:  redness, tenderness, or signs of infection (pain, swelling, redness, odor or green/yellow discharge around incision site)   Complete by: As directed    Call MD for:  severe uncontrolled pain   Complete by: As directed    Call MD for:  temperature >100.4    Complete by: As directed    Diet general   Complete by: As directed    Discharge instructions   Complete by: As directed    You were cared for by a hospitalist during your hospital stay. If you have any questions about your discharge medications or the care you received while you were in the hospital after you are discharged, you can call the unit and ask to speak with the hospitalist on call if the hospitalist that took care of you is not available. Once you are discharged, your primary care physician will handle any further medical issues. Please note that NO REFILLS for any discharge medications will be authorized once you are discharged, as it is imperative that you return to your primary care physician (or establish a relationship with a primary care physician if you do not have one) for your aftercare needs so that they can reassess your need for medications and monitor your lab values.  Follow up with PCP and [facility]. Take all medications as prescribed. If symptoms change or worsen please return to the ED for evaluation   Increase activity slowly   Complete by: As directed      Allergies as of 06/08/2019      Reactions   Benadryl [diphenhydramine Hcl] Itching, Swelling   Pt reeports "feels like throat swelling".      Medication List    STOP taking these medications   cholestyramine 4 g packet Commonly known as: Questran     TAKE these medications   colestipol 1 g tablet Commonly known as: COLESTID Take 2 tablets (2 g total) by mouth at bedtime.   Creon 24000-76000 units Cpep Generic drug: Pancrelipase (Lip-Prot-Amyl) Take 1 capsule (24,000 Units total) by mouth 3 (three) times daily with meals.   dicyclomine 20 MG tablet Commonly known as: Bentyl Take 1 tablet (20 mg total) by mouth 3 (three) times daily before meals. What changed:   when to take this  reasons to take this   feeding supplement (ENSURE ENLIVE) Liqd Take 237 mLs by mouth 3  (three) times daily between meals. What changed:   when to take this  reasons to take this   MAGNESIUM PO Take 1 tablet by mouth daily as needed (low magnesium).   multivitamin with minerals Tabs tablet Take 1 tablet by mouth daily.   ondansetron 4 MG tablet Commonly known as: Zofran Take 1 tablet (4 mg total) by mouth every 8 (eight) hours as needed for nausea or vomiting.   potassium chloride SA 20 MEQ tablet Commonly known as: KLOR-CON Take 2 tablets (40 mEq total) by mouth daily. Take 2 tablets in the morning and one tablet at night. What changed:   how much to take  when to take this  additional instructions      Follow-up Information    Bing Neighbors, FNP. Call.   Specialty: Family Medicine Why: Follow up within  1 week Contact information: [address] [place] [address] [phone]        Margaretmary Bayley, MD. Call in 1 day(s).   Specialty: Internal Medicine Why: Follow up with Gastroenterologist within 1-2 weeks Contact information: [address] [place] [address] [phone]          Allergies  Allergen Reactions  . Benadryl [Diphenhydramine Hcl] Itching and Swelling    Pt reeports "feels like throat swelling".     Consultations:  Gastroenterology  Procedures/Studies: US Abdomen Limited Ruq  Result Date: 06/08/2019 CLINICAL DATA:  28 year old female with a history of abnormal LFTs and prior cholecystectomy EXAM: ULTRASOUND ABDOMEN LIMITED RIGHT UPPER QUADRANT COMPARISON:  None. FINDINGS: Gallbladder: Surgically absent gallbladder. Common bile duct: Diameter: 4 mm Liver: No focal lesion identified. Within normal limits in parenchymal echogenicity. Portal vein is patent on color Doppler imaging with normal direction of blood flow towards the liver. Other: Relative increased echogenicity of the right kidney compared to the adjacent liver parenchyma. IMPRESSION: Unremarkable echotexture of liver  parenchyma. Surgically absent gallbladder. Relatively increased echogenicity of the right kidney compared to the liver parenchyma may indicate medical renal disease and correlation with lab values may be useful. Electronically Signed   By: Corrie Mckusick D.O.   On: 06/08/2019 10:03    Subjective: Seen and examined at bedside and she was doing well.  She had just come out of the shower and had no issues.  Ambulated without issues and states diarrhea has slowed down and only had a few bowel movements.  No other concerns reported at this time will need to be followed up with PCP and GI and was given the numbers for appointment.  Discharge Exam: Vitals:   06/07/19 2015 06/08/19 0504  BP: 103/68 104/67  Pulse: 79 90  Resp: 18 18  Temp: 99 F (37.2 C) 99 F (37.2 C)  SpO2: 100% 94%   Vitals:   06/07/19 0606 06/07/19 1252 06/07/19 2015 06/08/19 0504  BP: (!) 93/59 107/71 103/68 104/67  Pulse: 72 67 79 90  Resp: 16 20 18 18   Temp: 98.2 F (36.8 C) 98.4 F (36.9 C) 99 F (37.2 C) 99 F (37.2 C)  TempSrc: Oral Oral Oral Oral  SpO2: 100% 100% 100% 94%  Weight:      Height:       General: Pt is an extremely thin female who is alert, awake, not in acute distress Cardiovascular: RRR, S1/S2 +, no rubs, no gallops Respiratory: CTA bilaterally, no wheezing, no rhonchi Abdominal: Soft, NT, ND, bowel sounds + Extremities: no edema, no cyanosis  The results of significant diagnostics from this hospitalization (including imaging, microbiology, ancillary and laboratory) are listed below for reference.    Microbiology: Recent Results (from the past 240 hour(s))  SARS CORONAVIRUS 2 (TAT 6-24 HRS) Nasopharyngeal Nasopharyngeal Swab     Status: None   Collection Time: 06/04/19  3:35 PM   Specimen: Nasopharyngeal Swab  Result Value Ref Range Status   SARS Coronavirus 2 NEGATIVE NEGATIVE Final    Comment: (NOTE) SARS-CoV-2 target nucleic acids are NOT DETECTED. The SARS-CoV-2 RNA is generally  detectable in upper and lower respiratory specimens during the acute phase of infection. Negative results do not preclude SARS-CoV-2 infection, do not rule out co-infections with other pathogens, and should not be used as the sole basis for treatment or other patient management decisions. Negative results must be combined with clinical observations, patient history, and epidemiological information. The expected result  is Negative. Fact Sheet for Patients: HairSlick.no Fact Sheet for Healthcare Providers: quierodirigir.com This test is not yet approved or cleared by the [place] FDA and  has been authorized for detection and/or diagnosis of SARS-CoV-2 by FDA under an Emergency Use Authorization (EUA). This EUA will remain  in effect (meaning this test can be used) for the duration of the COVID-19 declaration under Section 56 4(b)(1) of the Act, 21 U.S.C. section 360bbb-3(b)(1), unless the authorization is terminated or revoked sooner. Performed at [facility], [address], [place], [place] [address]     Labs: BNP (last 3 results) No results for input(s): BNP in the last 8760 hours. Basic Metabolic Panel: Recent Labs  Lab 06/04/19 1343 06/05/19 0059 06/05/19 0924 06/06/19 0423 06/07/19 0417 06/08/19 0437  NA 140 139  --  137 138 137  K 2.0* 3.5  --  4.1 2.6* 4.6  CL 110 116*  --  111 98 103  CO2 19* 17*  --  18* 31 27  GLUCOSE 91 81  --  124* 101* 84  BUN 5* <5*  --  <5* <5* <5*  CREATININE 0.38* 0.33*  --  0.47 0.38* 0.37*  CALCIUM 8.7* 7.9*  --  8.3* 8.5* 9.2  MG 1.7  --  1.8 1.9 1.7 1.9  PHOS  --   --  2.1* 3.1 4.4 3.9   Liver Function Tests: Recent Labs  Lab 06/04/19 1343 06/05/19 0059 06/06/19 0423 06/07/19 0417 06/08/19 0437  AST 27 33 32 17 48*  ALT 40 41 37 31 63*  ALKPHOS 70 54 60 53 74  BILITOT 1.5* 1.1 1.2 1.0 1.1  PROT 8.0 5.9* 6.1* 6.0* 7.2  ALBUMIN 4.4 3.3* 3.5 3.4* 3.9    Recent Labs  Lab 06/04/19 1343 06/05/19 0059  LIPASE  --  20  AMYLASE 25*  --    No results for input(s): AMMONIA in the last 168 hours. CBC: Recent Labs  Lab 06/04/19 1343 06/05/19 0059 06/06/19 0423 06/07/19 0417 06/08/19 0437  WBC 6.8 4.2 6.2 6.0 7.1  NEUTROABS  --   --  4.2 4.0 5.0  HGB 12.8 10.1* 10.6* 10.4* 12.3  HCT 37.0 30.5* 31.2* 30.6* 37.0  MCV 83.7 87.6 86.2 84.5 86.2  PLT 249 168 192 187 209   Cardiac Enzymes: No results for input(s): CKTOTAL, CKMB, CKMBINDEX, TROPONINI in the last 168 hours. BNP: Invalid input(s): POCBNP CBG: No results for input(s): GLUCAP in the last 168 hours. D-Dimer No results for input(s): DDIMER in the last 72 hours. Hgb A1c No results for input(s): HGBA1C in the last 72 hours. Lipid Profile No results for input(s): CHOL, HDL, LDLCALC, TRIG, CHOLHDL, LDLDIRECT in the last 72 hours. Thyroid function studies No results for input(s): TSH, T4TOTAL, T3FREE, THYROIDAB in the last 72 hours.  Invalid input(s): FREET3 Anemia work up No results for input(s): VITAMINB12, FOLATE, FERRITIN, TIBC, IRON, RETICCTPCT in the last 72 hours. Urinalysis    Component Value Date/Time   COLORURINE STRAW (A) 06/04/2019 1420   APPEARANCEUR CLEAR 06/04/2019 1420   LABSPEC 1.004 (L) 06/04/2019 1420   PHURINE 8.0 06/04/2019 1420   GLUCOSEU NEGATIVE 06/04/2019 1420   HGBUR MODERATE (A) 06/04/2019 1420   BILIRUBINUR NEGATIVE 06/04/2019 1420   KETONESUR 5 (A) 06/04/2019 1420   PROTEINUR NEGATIVE 06/04/2019 1420   UROBILINOGEN 1.0 11/26/2014 1435   NITRITE NEGATIVE 06/04/2019 1420   LEUKOCYTESUR NEGATIVE 06/04/2019 1420   Sepsis Labs Invalid input(s): PROCALCITONIN,  WBC,  LACTICIDVEN Microbiology Recent  Results (from the past 240 hour(s))  SARS CORONAVIRUS 2 (TAT 6-24 HRS) Nasopharyngeal Nasopharyngeal Swab     Status: None   Collection Time: 06/04/19  3:35 PM   Specimen: Nasopharyngeal Swab  Result Value Ref Range Status   SARS Coronavirus 2  NEGATIVE NEGATIVE Final    Comment: (NOTE) SARS-CoV-2 target nucleic acids are NOT DETECTED. The SARS-CoV-2 RNA is generally detectable in upper and lower respiratory specimens during the acute phase of infection. Negative results do not preclude SARS-CoV-2 infection, do not rule out co-infections with other pathogens, and should not be used as the sole basis for treatment or other patient management decisions. Negative results must be combined with clinical observations, patient history, and epidemiological information. The expected result is Negative. Fact Sheet for Patients: HairSlick.no Fact Sheet for Healthcare Providers: quierodirigir.com This test is not yet approved or cleared by the [place] FDA and  has been authorized for detection and/or diagnosis of SARS-CoV-2 by FDA under an Emergency Use Authorization (EUA). This EUA will remain  in effect (meaning this test can be used) for the duration of the COVID-19 declaration under Section 56 4(b)(1) of the Act, 21 U.S.C. section 360bbb-3(b)(1), unless the authorization is terminated or revoked sooner. Performed at [facility], [address], [place], [place] [address]    Time coordinating discharge: 35 minutes  SIGNED:  Merlene Laughter, DO [facility] 06/08/2019, 11:37 AM Pager is on AMION  If 7PM-7AM, please contact night-coverage [email or web address] Password [email or web address]

## 2019-06-16 ENCOUNTER — Telehealth: Payer: Self-pay

## 2019-06-16 NOTE — Telephone Encounter (Signed)

## 2019-06-17 ENCOUNTER — Ambulatory Visit: Payer: Self-pay

## 2019-07-05 ENCOUNTER — Ambulatory Visit: Payer: Self-pay

## 2019-07-06 ENCOUNTER — Other Ambulatory Visit: Payer: Self-pay

## 2019-07-06 ENCOUNTER — Telehealth: Payer: Self-pay

## 2019-07-06 ENCOUNTER — Ambulatory Visit (INDEPENDENT_AMBULATORY_CARE_PROVIDER_SITE_OTHER): Payer: Self-pay | Admitting: Internal Medicine

## 2019-07-06 VITALS — BP 105/67 | HR 71 | Temp 97.2°F | Resp 17 | Wt 87.0 lb

## 2019-07-06 DIAGNOSIS — F329 Major depressive disorder, single episode, unspecified: Secondary | ICD-10-CM

## 2019-07-06 DIAGNOSIS — K529 Noninfective gastroenteritis and colitis, unspecified: Secondary | ICD-10-CM

## 2019-07-06 DIAGNOSIS — R7989 Other specified abnormal findings of blood chemistry: Secondary | ICD-10-CM

## 2019-07-06 DIAGNOSIS — K9089 Other intestinal malabsorption: Secondary | ICD-10-CM

## 2019-07-06 DIAGNOSIS — E876 Hypokalemia: Secondary | ICD-10-CM

## 2019-07-06 DIAGNOSIS — Z2821 Immunization not carried out because of patient refusal: Secondary | ICD-10-CM

## 2019-07-06 DIAGNOSIS — R945 Abnormal results of liver function studies: Secondary | ICD-10-CM

## 2019-07-06 MED ORDER — POTASSIUM CHLORIDE CRYS ER 20 MEQ PO TBCR: 40.0000 meq | EXTENDED_RELEASE_TABLET | Freq: Every day | ORAL | 6 refills | Status: DC

## 2019-07-06 MED ORDER — FOLIC ACID 1 MG PO TABS: 1.0000 mg | ORAL_TABLET | Freq: Every day | ORAL | 2 refills | Status: DC

## 2019-07-06 MED ORDER — COLESTIPOL HCL 1 G PO TABS: 2.0000 g | ORAL_TABLET | Freq: Every day | ORAL | 6 refills | Status: DC

## 2019-07-06 MED ORDER — POTASSIUM CHLORIDE CRYS ER 20 MEQ PO TBCR: EXTENDED_RELEASE_TABLET | ORAL | 6 refills | Status: DC

## 2019-07-06 MED ORDER — DICYCLOMINE HCL 20 MG PO TABS: 20.0000 mg | ORAL_TABLET | Freq: Three times a day (TID) | ORAL | 6 refills | Status: DC

## 2019-07-06 MED ORDER — MULTIVITAMIN PO TABS: 1.0000 | ORAL_TABLET | Freq: Every day | ORAL | 3 refills | Status: DC

## 2019-07-06 MED ORDER — CREON 24000-76000 UNITS PO CPEP: 1.0000 | ORAL_CAPSULE | Freq: Three times a day (TID) | ORAL | 6 refills | Status: DC

## 2019-07-06 NOTE — Patient Instructions (Addendum)
Please give patient an appointment with the licensed clinical social worker.  Please apply for the orange card/cone discount card.  Once approved we can refer you to the gastroenterologist.  Please take your medications every day as prescribed.  I have sent these prescriptions to the pharmacy at our main site on [facility].

## 2019-07-06 NOTE — Progress Notes (Signed)
Here for hospital follow up for hypokalemia & chronic malnutrition.  Needs refills on the Bentyl & potassium supplement.n ran out of both 2 days ago.  States that she has been feeling "up & down" since getting home from hospital.

## 2019-07-06 NOTE — Progress Notes (Signed)
Patient ID: Sara Hill, female    DOB: March 08, 1991  MRN: [id number]  CC: Hospitalization Follow-up   Subjective: Sara Hill is a 28 y.o. 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  Patient hospitalized 10/23-20 02/2019 with sycope secondary to dehydration and chronic malnutrition.  She was found to have hypokalemia with potassium level of 2.  Patient apparently was supposed to be on potassium supplement but had not been taking.  Potassium was repleted while in the hospital.  Level at discharge was 4.6.  Also found to have low phosphate and Mg which nl by time of dischg. -Patient also experienced chronic abdominal pain and diarrhea.  She was placed on Bentyl 20 mg 3 times a day, Creon, and colestipol 2 g at bedtime.  Symptoms improved.  Patient was advised to follow-up with GI at [facility] whom she had seen in the past and had EGD and colonoscopy done by them.  Seen by dietitian and Ensure Enlive p.o. 3 times daily supplementation recommended. -Found to have a normocytic anemia with iron level suggesting anemia of chronic disease.  Folate level was low at 3.5.  B12 level 342.  She had mild elevation in AST/ALT.  Hepatitis panel was negative.  She had ultrasound of the abdomen which revealed surgically absent gallbladder, no focal lesions in the liver and relative increased echogenicity of the right kidney questionably indicating medical renal disease.  Today: Patient reports that she is doing better since hospital discharge.  Reports compliance with medications.  However she has been out of potassium and Bentyl for 2 days.  While at work last evening she felt hot and felt like she was about to faint.  She thinks this was caused by her being out of the medications for 2 days.  She still has chronic diarrhea but frequency has decreased from 10 times a day  to 5 times a day since being on the colestipol, Bentyl and Creon.  She is eating 3 meals a day with Ensure 3 times a day to supplement her meals. -Missed an appointment with GI at [facility] on 06/30/2019 due to limited transportation.  She is not sure how she would be able to afford all of her medications as her hours at work have been cut.  She works at [employer].  PHQ-9 was positive today.  She reports feeling down sometimes due to her ongoing medical issues and having her work hours cut recently creating some financial difficulties.  She is not interested in being on medication for depression but is open to speaking with our LCSW.  Patient Active Problem List   Diagnosis Date Noted  . Chronic malnutrition (HCC) 06/04/2019  . Chronic diarrhea   . Does not have health insurance   . Acute hypokalemia 01/26/2019  . Dehydration 11/01/2018  . Diarrhea 11/01/2018  . Abdominal pain 11/01/2018  . Weakness 11/01/2018  . Nausea and vomiting   . Severe protein-calorie malnutrition (HCC)   . Protein-calorie malnutrition, severe 06/29/2018  . Hypokalemia 06/27/2018     Current Outpatient Medications on File Prior to Visit  Medication Sig Dispense Refill  . feeding supplement, ENSURE ENLIVE, (ENSURE ENLIVE) LIQD Take 237 mLs by mouth 3 (three) times daily between meals. (Patient taking differently: Take 237 mLs by mouth 3 (three) times daily as needed (nutrition). ) 237 mL 12  . MAGNESIUM PO Take 1 tablet by mouth  daily as needed (low magnesium).      No current facility-administered medications on file prior to visit.     Allergies  Allergen Reactions  . Benadryl [Diphenhydramine Hcl] Itching and Swelling    Pt reeports "feels like throat swelling".     Social History   Socioeconomic History  . Marital status: Single    Spouse name: Not on file  . Number of children: Not on file  . Years of education: Not on file  . Highest education level: Not on file  Occupational History  .  Not on file  Social Needs  . Financial resource strain: Not on file  . Food insecurity    Worry: Not on file    Inability: Not on file  . Transportation needs    Medical: Not on file    Non-medical: Not on file  Tobacco Use  . Smoking status: Never Smoker  . Smokeless tobacco: Never Used  Substance and Sexual Activity  . Alcohol use: No  . Drug use: Yes    Types: Marijuana  . Sexual activity: Never  Lifestyle  . Physical activity    Days per week: Not on file    Minutes per session: Not on file  . Stress: Not on file  Relationships  . Social Herbalist on phone: Not on file    Gets together: Not on file    Attends religious service: Not on file    Active member of club or organization: Not on file    Attends meetings of clubs or organizations: Not on file    Relationship status: Not on file  . Intimate partner violence    Fear of current or ex partner: Not on file    Emotionally abused: Not on file    Physically abused: Not on file    Forced sexual activity: Not on file  Other Topics Concern  . Not on file  Social History Narrative   Lives at home with a friend.  Independent at baseline    Family History  Problem Relation Age of Onset  . Healthy Mother   . Diabetes Mother     Past Surgical History:  Procedure Laterality Date  . CHOLECYSTECTOMY    . COLONOSCOPY  01/2018   normal  . ESOPHAGOGASTRODUODENOSCOPY  01/2018   normal    ROS: Review of Systems Negative except as stated above  PHYSICAL EXAM: BP 105/67   Pulse 71   Temp (!) 97.2 F (36.2 C) (Temporal)   Resp 17   Wt 87 lb (39.5 kg)   SpO2 98%   BMI 16.44 kg/m   Wt Readings from Last 3 Encounters:  07/06/19 87 lb (39.5 kg)  06/04/19 80 lb 4 oz (36.4 kg)  01/26/19 87 lb 8.4 oz (39.7 kg)    Physical Exam  General appearance -young African-American female who appears chronically ill and malnourished Mental status -flat affect.  Normal thought process.  She is oriented and  answers questions appropriately. Mouth -slightly dry.   Neck - supple, no significant adenopathy Chest - clear to auscultation, no wheezes, rales or rhonchi, symmetric air entry Heart - normal rate, regular rhythm, normal S1, S2, no murmurs, rubs, clicks or gallops Abdomen - soft, nontender, nondistended, no masses or organomegaly Extremities -clubbing of the nails.  Depression screen PHQ 2/9 07/06/2019  Decreased Interest 2  Down, Depressed, Hopeless 2  PHQ - 2 Score 4  Altered sleeping 2  Tired, decreased energy 2  Change in  appetite 2  Feeling bad or failure about yourself  0  Trouble concentrating 0  Moving slowly or fidgety/restless 0  Suicidal thoughts 0  PHQ-9 Score 10   GAD 7 : Generalized Anxiety Score 07/06/2019  Nervous, Anxious, on Edge 1  Control/stop worrying 0  Worry too much - different things 1  Trouble relaxing 2  Restless 1  Easily annoyed or irritable 2  Afraid - awful might happen 0  Total GAD 7 Score 7     CMP Latest Ref Rng & Units 06/08/2019 06/07/2019 06/06/2019  Glucose 70 - 99 mg/dL 84 161(W) 960(A)  BUN 6 - 20 mg/dL <5(W) <0(J) <8(J)  Creatinine 0.44 - 1.00 mg/dL 1.91(Y) 7.82(N) 5.62  Sodium 135 - 145 mmol/L 137 138 137  Potassium 3.5 - 5.1 mmol/L 4.6 2.6(LL) 4.1  Chloride 98 - 111 mmol/L 103 98 111  CO2 22 - 32 mmol/L 27 31 18(L)  Calcium 8.9 - 10.3 mg/dL 9.2 1.3(Y) 8.3(L)  Total Protein 6.5 - 8.1 g/dL 7.2 6.0(L) 6.1(L)  Total Bilirubin 0.3 - 1.2 mg/dL 1.1 1.0 1.2  Alkaline Phos 38 - 126 U/L 74 53 60  AST 15 - 41 U/L 48(H) 17 32  ALT 0 - 44 U/L 63(H) 31 37   Lipid Panel  No results found for: CHOL, TRIG, HDL, CHOLHDL, VLDL, LDLCALC, LDLDIRECT  CBC    Component Value Date/Time   WBC 7.1 06/08/2019 0437   RBC 4.29 06/08/2019 0437   HGB 12.3 06/08/2019 0437   HCT 37.0 06/08/2019 0437   PLT 209 06/08/2019 0437   MCV 86.2 06/08/2019 0437   MCH 28.7 06/08/2019 0437   MCHC 33.2 06/08/2019 0437   RDW 15.3 06/08/2019 0437   LYMPHSABS  1.4 06/08/2019 0437   MONOABS 0.6 06/08/2019 0437   EOSABS 0.1 06/08/2019 0437   BASOSABS 0.0 06/08/2019 0437    ASSESSMENT AND PLAN: 1. Other specified intestinal malabsorption 2. Chronic diarrhea -I recommend that she get her medications through our main pharmacy as it would be cheaper for her.  She should be able to get the Creon through the patient assistance program.  Encourage compliance with medications. -Advised to apply for the orange card/cone discount card.  Once approved we can refer her to the gastroenterologist here in [place].  We will bring her back in about 6 to 7 weeks to see if she has completed the process. - CBC - Comprehensive metabolic panel - Pancrelipase, Lip-Prot-Amyl, (CREON) 24000-76000 units CPEP; Take 1 capsule (24,000 Units total) by mouth 3 (three) times daily with meals.  Dispense: 90 capsule; Refill: 6 - colestipol (COLESTID) 1 g tablet; Take 2 tablets (2 g total) by mouth at bedtime.  Dispense: 60 tablet; Refill: 6 - dicyclomine (BENTYL) 20 MG tablet; Take 1 tablet (20 mg total) by mouth 3 (three) times daily before meals.  Dispense: 90 tablet; Refill: 6 - Multiple Vitamin (MULTIVITAMIN) TABS; Take 1 tablet by mouth daily.  Dispense: 100 tablet; Refill: 3 - folic acid (FOLVITE) 1 MG tablet; Take 1 tablet (1 mg total) by mouth daily.  Dispense: 100 tablet; Refill: 2  3. Reactive depression Patient does not feel that she needs to be on medications at this time.  Has good support at home.  She is willing however to meet with our LCSW  4. Hypokalemia I anticipate that potassium level will be low since she has been off the medicine for 2 days. - potassium chloride SA (KLOR-CON) 20 MEQ tablet; Take 2 tabs PO Q a.m  and 1 tab PO Q p.m  Dispense: 90 tablet; Refill: 6  5. Hypomagnesemia - Magnesium  6. Hypophosphatemia - Phosphorus  7. Abnormal LFTs Hepatitis panel negative  8. Influenza vaccination declined Offered to patient today but she  declined.    Patient was given the opportunity to ask questions.  Patient verbalized understanding of the plan and was able to repeat key elements of the plan.   Orders Placed This Encounter  Procedures  . CBC  . Comprehensive metabolic panel  . Magnesium  . Phosphorus     Requested Prescriptions   Signed Prescriptions Disp Refills  . Pancrelipase, Lip-Prot-Amyl, (CREON) 24000-76000 units CPEP 90 capsule 6    Sig: Take 1 capsule (24,000 Units total) by mouth 3 (three) times daily with meals.  . colestipol (COLESTID) 1 g tablet 60 tablet 6    Sig: Take 2 tablets (2 g total) by mouth at bedtime.  . dicyclomine (BENTYL) 20 MG tablet 90 tablet 6    Sig: Take 1 tablet (20 mg total) by mouth 3 (three) times daily before meals.  . Multiple Vitamin (MULTIVITAMIN) TABS 100 tablet 3    Sig: Take 1 tablet by mouth daily.  . folic acid (FOLVITE) 1 MG tablet 100 tablet 2    Sig: Take 1 tablet (1 mg total) by mouth daily.  . potassium chloride SA (KLOR-CON) 20 MEQ tablet 90 tablet 6    Sig: Take 2 tabs PO Q a.m and 1 tab PO Q p.m    Return in about 7 weeks (around 08/24/2019).  Jonah Blueeborah Shelsy Seng, MD, FACP

## 2019-07-07 LAB — CBC
Hematocrit: 38.7 % (ref 34.0–46.6)
Hemoglobin: 13 g/dL (ref 11.1–15.9)
MCH: 28.3 pg (ref 26.6–33.0)
MCHC: 33.6 g/dL (ref 31.5–35.7)
MCV: 84 fL (ref 79–97)
Platelets: 273 10*3/uL (ref 150–450)
RBC: 4.6 x10E6/uL (ref 3.77–5.28)
RDW: 14.7 % (ref 11.7–15.4)
WBC: 5.9 10*3/uL (ref 3.4–10.8)

## 2019-07-07 LAB — COMPREHENSIVE METABOLIC PANEL
ALT: 20 IU/L (ref 0–32)
AST: 18 IU/L (ref 0–40)
Albumin/Globulin Ratio: 1.7 (ref 1.2–2.2)
Albumin: 4.7 g/dL (ref 3.9–5.0)
Alkaline Phosphatase: 78 IU/L (ref 39–117)
BUN/Creatinine Ratio: 15 (ref 9–23)
BUN: 7 mg/dL (ref 6–20)
Bilirubin Total: 0.6 mg/dL (ref 0.0–1.2)
CO2: 22 mmol/L (ref 20–29)
Calcium: 9.1 mg/dL (ref 8.7–10.2)
Chloride: 102 mmol/L (ref 96–106)
Creatinine, Ser: 0.47 mg/dL — ABNORMAL LOW (ref 0.57–1.00)
GFR calc Af Amer: 154 mL/min/{1.73_m2} (ref >59)
GFR calc non Af Amer: 134 mL/min/{1.73_m2} (ref >59)
Globulin, Total: 2.7 g/dL (ref 1.5–4.5)
Glucose: 71 mg/dL (ref 65–99)
Potassium: 3.1 mmol/L — ABNORMAL LOW (ref 3.5–5.2)
Sodium: 139 mmol/L (ref 134–144)
Total Protein: 7.4 g/dL (ref 6.0–8.5)

## 2019-07-07 LAB — MAGNESIUM: Magnesium: 2 mg/dL (ref 1.6–2.3)

## 2019-07-07 LAB — PHOSPHORUS: Phosphorus: 3.8 mg/dL (ref 3.0–4.3)

## 2019-07-13 ENCOUNTER — Other Ambulatory Visit: Payer: Self-pay

## 2019-07-13 ENCOUNTER — Institutional Professional Consult (permissible substitution): Payer: Self-pay | Admitting: Licensed Clinical Social Worker

## 2019-07-13 ENCOUNTER — Telehealth: Payer: Self-pay | Admitting: Licensed Clinical Social Worker

## 2019-07-13 NOTE — Telephone Encounter (Signed)
Call placed to patient regarding scheduled [facility] appointment. LCSW left message for a return call.  

## 2019-08-23 MED FILL — POTASSIUM CL ER 20 MEQ TAB: 20 | 30 days supply | Qty: 90 | Fill #0

## 2019-08-23 MED FILL — DICYCLOMINE 20 MG TABLET: 20 | 30 days supply | Qty: 90 | Fill #0

## 2019-08-23 MED FILL — FOLIC ACID 1 MG TABS: 1 | 30 days supply | Qty: 30 | Fill #0

## 2019-08-31 ENCOUNTER — Ambulatory Visit (INDEPENDENT_AMBULATORY_CARE_PROVIDER_SITE_OTHER): Payer: Self-pay | Admitting: Internal Medicine

## 2019-08-31 DIAGNOSIS — U071 COVID-19: Secondary | ICD-10-CM

## 2019-08-31 DIAGNOSIS — K909 Intestinal malabsorption, unspecified: Secondary | ICD-10-CM

## 2019-08-31 DIAGNOSIS — K529 Noninfective gastroenteritis and colitis, unspecified: Secondary | ICD-10-CM

## 2019-08-31 NOTE — Progress Notes (Signed)
Virtual Visit via Telephone Note Due to current restrictions/limitations of in-office visits due to the COVID-19 pandemic, this scheduled clinical appointment was converted to a telehealth visit  I connected with Sara Hill on 08/31/19 at  4:10 PM EST by telephone and verified that I am speaking with the correct person using two identifiers. I am in my office.  The patient is at home.  Only the patient and myself participated in this encounter.  I discussed the limitations, risks, security and privacy concerns of performing an evaluation and management service by telephone and the availability of in person appointments. I also discussed with the patient that there may be a patient responsible charge related to this service. The patient expressed understanding and agreed to proceed.   History of Present Illness: Patient with history of chronic malabsorption and diarrhea since gallbladder removal in 2015.  Since last visit, pt tested positive for COVID 08/13/2019.   Doing better but still has a little loss of taste and smell.  Did not have any fever or bad cough.     Malabsorption and chronic diarrhea:  Out of Potassium, Bentyl x 2 wks due to limited finances.  Just got refill 08/16/2019.  She would like to have potassium level checked.  She reports some body aches and soreness which she feels may be due to low potassium.  She stopped taking the Colestipol about 1 wk after last visit because it was making her constipated.  Having diarrhea 8 times a day -reports loss of appetite  -on last visit, we discuss having her apply for [facility] discount so that we can refer to GI in [place].  Patient states she has completed the forms but does not have supporting documents as yet. Observations/Objective: No direct observation done as this was a telephone encounter  Assessment and Plan: 1. Intestinal malabsorption, unspecified type 2. Chronic diarrhea -I recommend starting the colestipol but instead of  taking 2 g at bedtime, she can try decreasing to 1 g at bedtime.  Goal is to try balance out not having too much diarrhea but also not helping constipation. She will come to the lab this week to have electrolytes drawn. Encouraged her to get the supporting documents for her forms for the orange card/cone discount let me know once she is approved to let me can refer to GI  3. COVID-19 virus infection Still has some minor loss of taste and smell but otherwise has recovered well.   Patient was given the opportunity to ask questions.  Patient verbalized understanding of the plan and was able to repeat key elements of the plan.   No orders of the defined types were placed in this encounter.    Requested Prescriptions    No prescriptions requested or ordered in this encounter     Follow Up Instructions: 3 mths   I discussed the assessment and treatment plan with the patient. The patient was provided an opportunity to ask questions and all were answered. The patient agreed with the plan and demonstrated an understanding of the instructions.   The patient was advised to call back or seek an in-person evaluation if the symptoms worsen or if the condition fails to improve as anticipated.  I provided 12:30 minutes of non-face-to-face time during this encounter.   Jonah Blue, MD

## 2020-01-05 NOTE — Telephone Encounter (Signed)
CREATED IN ERROR

## 2020-01-25 IMAGING — MR MR ANKLE*L* WO/W CM
6 of 8 series · 31 of 40 positions shown · IV contrast (gadavist)
Comparison: Left foot x-rays from yesterday.

CLINICAL DATA: Chronic plantar heel mass.

EXAM:
MRI OF THE LEFT ANKLE WITHOUT AND WITH CONTRAST
TECHNIQUE: Multiplanar, multisequence MR imaging of the ankle was performed
before and after the administration of intravenous contrast.
CONTRAST:  3 mL Gadavist intravenous contrast.

[Series 5: T2 fat-sat · axial · left · 3.0mm · 0.47mm/px · z∈[-61,+78]mm · 6 of 36 slices shown (1 of 2)]
[im 1/36]
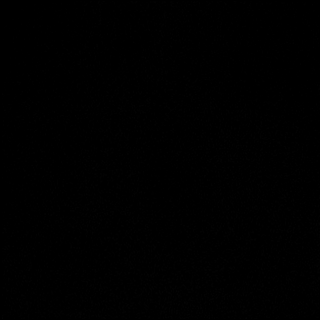
[im 8/36]
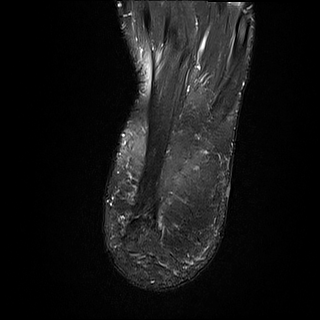
[im 15/36]
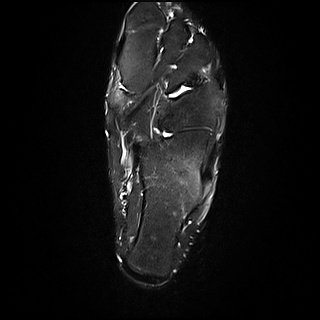
[im 22/36]
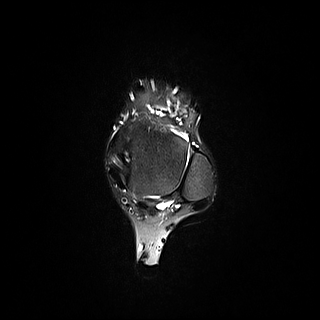
[im 29/36]
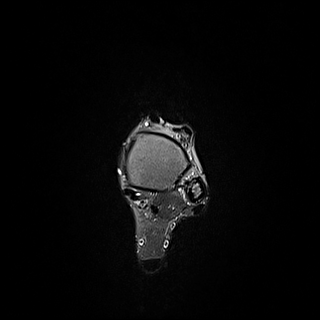
[im 36/36]
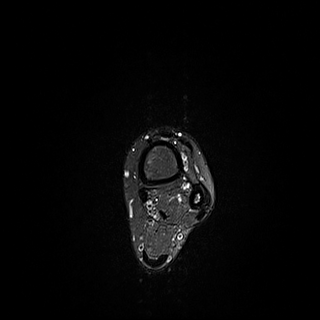

[Series 6: PD fat-sat · axial · left · 3.0mm · 0.47mm/px · z∈[-61,+78]mm · 6 of 36 slices shown]
[im 1/36]
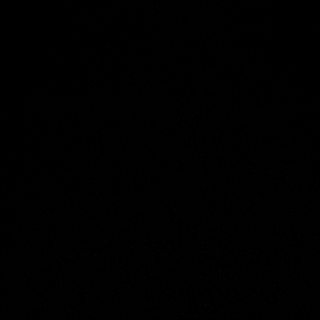
[im 8/36]
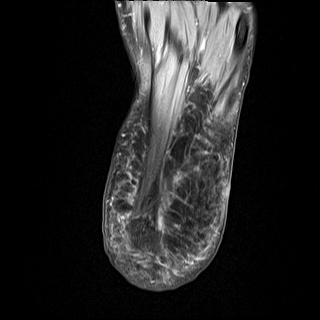
[im 15/36]
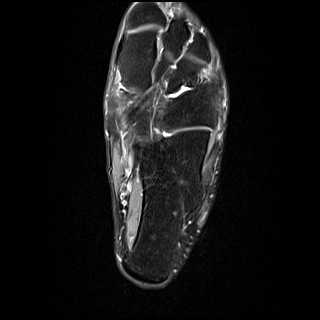
[im 22/36]
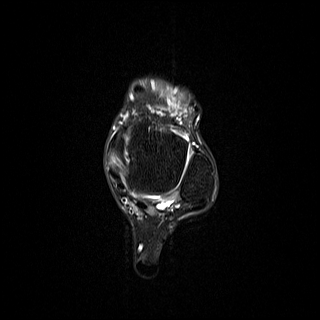
[im 29/36]
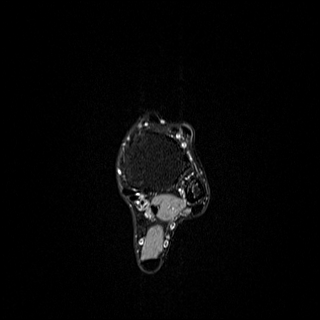
[im 36/36]
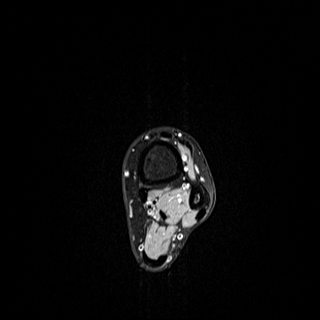

[Series 7: T2 fat-sat · coronal · left · 3.0mm · 0.59mm/px · 5 of 30 slices shown (2 of 2)]
[im 1/30]
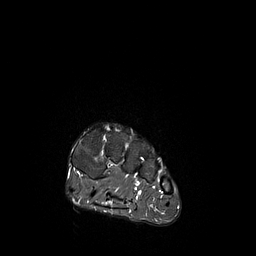
[im 8/30]
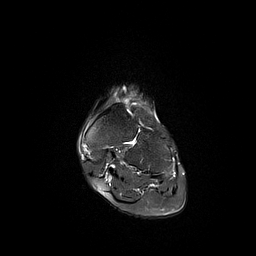
[im 15/30]
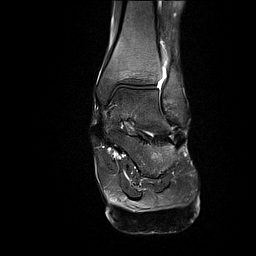
[im 22/30]
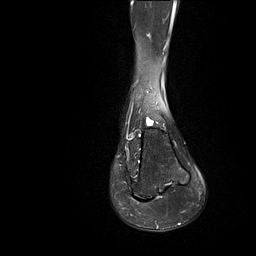
[im 30/30]
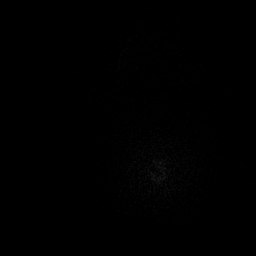

[Series 8: T1 · sagittal · left · 4.0mm · 0.59mm/px · 3 of 16 slices shown]
[im 1/16]
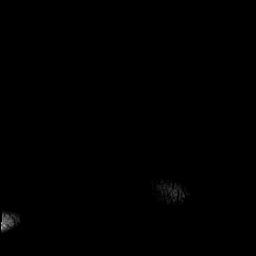
[im 8/16]
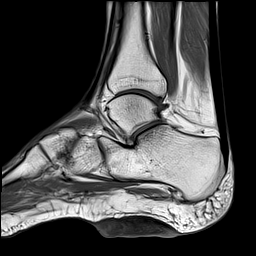
[im 16/16]
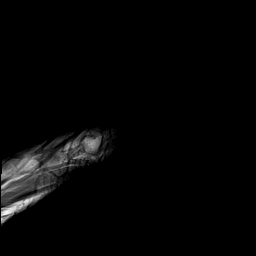

[Series 10: T1 fat-sat · axial · non-contrast · left · 3.0mm · 0.29mm/px · z∈[-61,+78]mm · 6 of 36 slices shown]
[im 1/36]
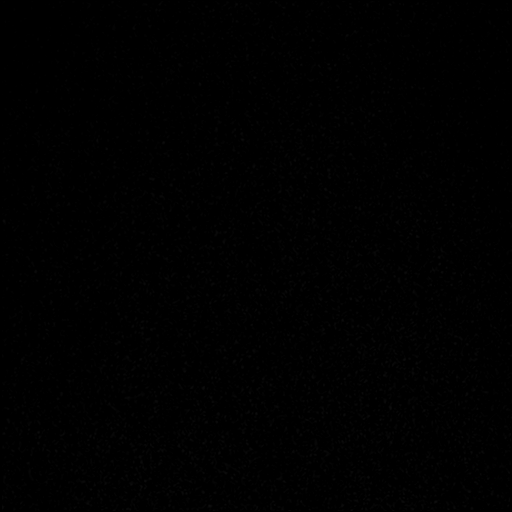
[im 8/36]
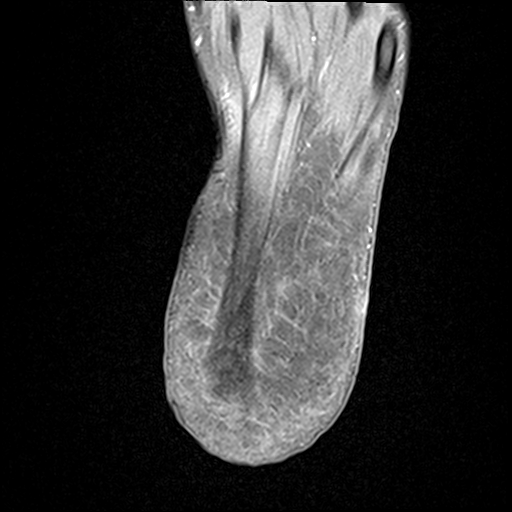
[im 15/36]
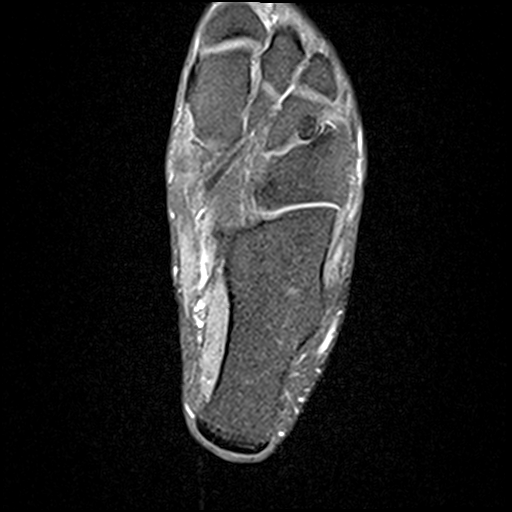
[im 22/36]
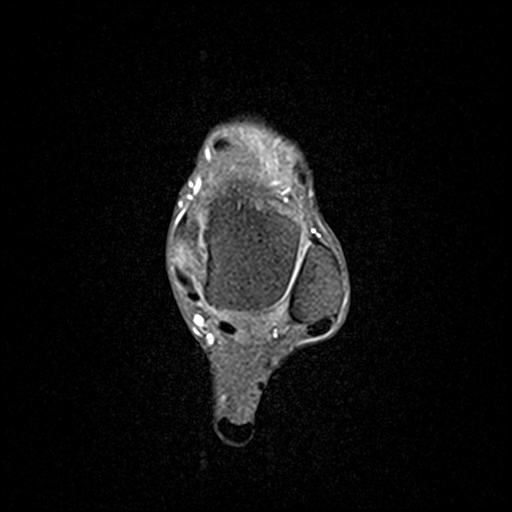
[im 29/36]
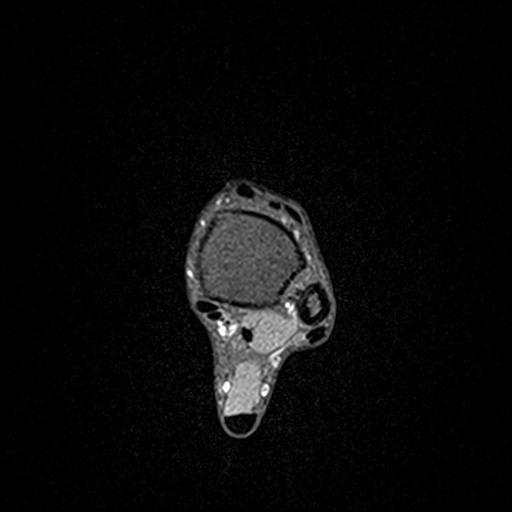
[im 36/36]
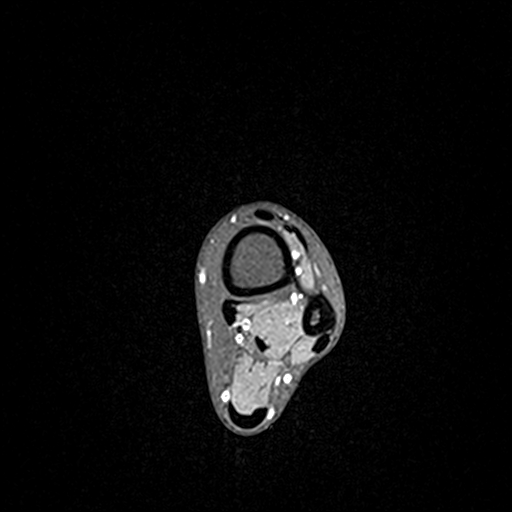

[Series 11: T1 fat-sat post-contrast · axial · left · 3.0mm · 0.29mm/px · z∈[-61,+50]mm · 5 of 36 slices shown]
[im 1/36]
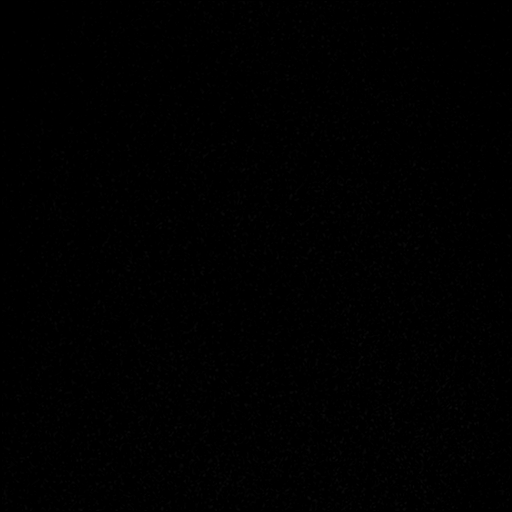
[im 8/36]
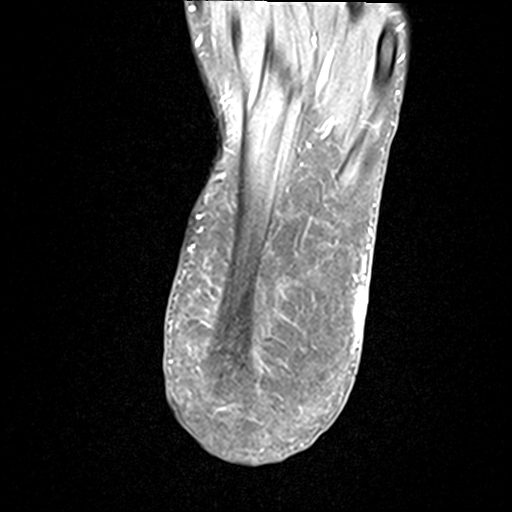
[im 15/36]
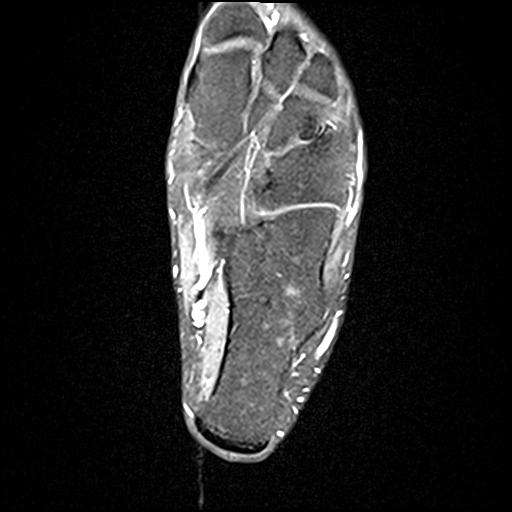
[im 22/36]
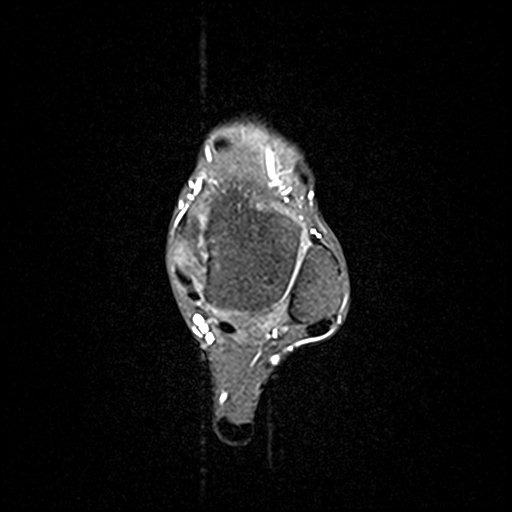
[im 29/36]
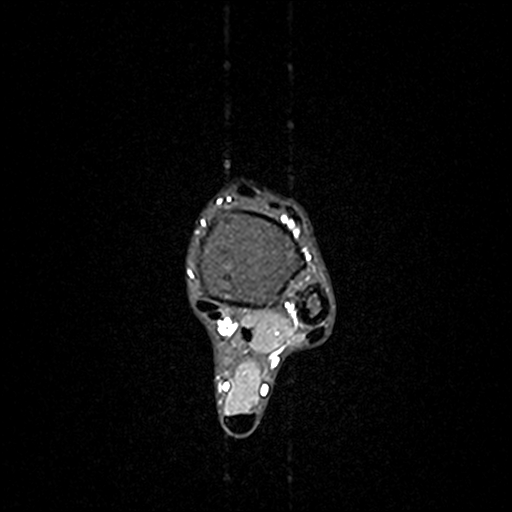

[31 of 40 positions shown; findings below may reference images not displayed]

FINDINGS: TENDONS

Peroneal: Peroneal longus tendon intact. Peroneal brevis intact.

Posteromedial: Posterior tibial tendon intact. Flexor hallucis
longus tendon intact. Flexor digitorum longus tendon intact.

Anterior: Tibialis anterior tendon intact. Extensor hallucis longus
tendon intact Extensor digitorum longus tendon intact.

Achilles:  Intact.

Plantar Fascia: Intact.

LIGAMENTS

Lateral: Anterior talofibular ligament intact. Calcaneofibular
ligament intact. Posterior talofibular ligament intact. Anterior and
posterior tibiofibular ligaments intact.

Medial: Deltoid ligament intact. Spring ligament intact.

CARTILAGE

Ankle Joint: No joint effusion. Normal ankle mortise. No chondral
defect.

Subtalar Joints/Sinus Tarsi: Normal subtalar joints. No subtalar
joint effusion. Normal sinus tarsi.

Bones: No marrow signal abnormality.  No fracture or dislocation.

Soft Tissue: Along the plantar surface of the hindfoot, there is a
large superficial skin mass measuring 4.9 x 4.9 x 1.3 cm (AP by
transverse by CC). The mass is predominantly hypointense on T2
images, with peripheral nodular T2 hyperintensity, and
heterogeneously T1 signal with areas of T1 hyperintensity. There is
peripheral nodular enhancement. There is no extension into the
subcutaneous fat.
IMPRESSION: IMPRESSION
1. Large, peripherally enhancing 4.9 cm dermal mass of the plantar
hindfoot, indeterminate. No extension into the subcutaneous fat.
Given areas of internal T1 hyperintensity, melanoma is a
possibility. The differential diagnosis also includes a fibrous
tumor. Recommend biopsy and/or surgical excision.

## 2020-01-26 ENCOUNTER — Encounter (HOSPITAL_COMMUNITY): Payer: Self-pay

## 2020-01-26 ENCOUNTER — Inpatient Hospital Stay (HOSPITAL_COMMUNITY)
Admission: EM | Admit: 2020-01-26 | Discharge: 2020-01-28 | DRG: 640 | Disposition: A | Discharge status: Home / Self Care | Payer: Self-pay | Attending: Internal Medicine | Admitting: Internal Medicine

## 2020-01-26 DIAGNOSIS — K529 Noninfective gastroenteritis and colitis, unspecified: Secondary | ICD-10-CM | POA: Diagnosis present

## 2020-01-26 DIAGNOSIS — E872 Acidosis: Secondary | ICD-10-CM | POA: Diagnosis present

## 2020-01-26 DIAGNOSIS — Z91138 Patient's unintentional underdosing of medication regimen for other reason: Secondary | ICD-10-CM

## 2020-01-26 DIAGNOSIS — Z79899 Other long term (current) drug therapy: Secondary | ICD-10-CM

## 2020-01-26 DIAGNOSIS — E43 Unspecified severe protein-calorie malnutrition: Secondary | ICD-10-CM | POA: Diagnosis present

## 2020-01-26 DIAGNOSIS — Z20822 Contact with and (suspected) exposure to covid-19: Secondary | ICD-10-CM | POA: Diagnosis present

## 2020-01-26 DIAGNOSIS — R109 Unspecified abdominal pain: Secondary | ICD-10-CM | POA: Diagnosis present

## 2020-01-26 DIAGNOSIS — Z681 Body mass index (BMI) 19 or less, adult: Secondary | ICD-10-CM

## 2020-01-26 DIAGNOSIS — K9089 Other intestinal malabsorption: Secondary | ICD-10-CM

## 2020-01-26 DIAGNOSIS — G8929 Other chronic pain: Secondary | ICD-10-CM | POA: Diagnosis present

## 2020-01-26 DIAGNOSIS — T503X6A Underdosing of electrolytic, caloric and water-balance agents, initial encounter: Secondary | ICD-10-CM | POA: Diagnosis present

## 2020-01-26 DIAGNOSIS — E876 Hypokalemia: Principal | ICD-10-CM | POA: Diagnosis present

## 2020-01-26 DIAGNOSIS — Z9049 Acquired absence of other specified parts of digestive tract: Secondary | ICD-10-CM

## 2020-01-26 DIAGNOSIS — Z833 Family history of diabetes mellitus: Secondary | ICD-10-CM

## 2020-01-26 DIAGNOSIS — R531 Weakness: Secondary | ICD-10-CM

## 2020-01-26 DIAGNOSIS — Z888 Allergy status to other drugs, medicaments and biological substances status: Secondary | ICD-10-CM

## 2020-01-26 LAB — COMPREHENSIVE METABOLIC PANEL
ALT: 22 U/L (ref 0–44)
AST: 23 U/L (ref 15–41)
Albumin: 4.6 g/dL (ref 3.5–5.0)
Alkaline Phosphatase: 66 U/L (ref 38–126)
Anion gap: 10 (ref 5–15)
BUN: <5 mg/dL — ABNORMAL LOW (ref 6–20)
CO2: 22 mmol/L (ref 22–32)
Calcium: 8.7 mg/dL — ABNORMAL LOW (ref 8.9–10.3)
Chloride: 108 mmol/L (ref 98–111)
Creatinine, Ser: 0.35 mg/dL — ABNORMAL LOW (ref 0.44–1.00)
GFR calc Af Amer: >60 mL/min (ref >60)
GFR calc non Af Amer: >60 mL/min (ref >60)
Glucose, Bld: 101 mg/dL — ABNORMAL HIGH (ref 70–99)
Potassium: <2 mmol/L — CL (ref 3.5–5.1)
Sodium: 140 mmol/L (ref 135–145)
Total Bilirubin: 1.5 mg/dL — ABNORMAL HIGH (ref 0.3–1.2)
Total Protein: 8.3 g/dL — ABNORMAL HIGH (ref 6.5–8.1)

## 2020-01-26 LAB — URINALYSIS, ROUTINE W REFLEX MICROSCOPIC
Bilirubin Urine: NEGATIVE
Glucose, UA: NEGATIVE mg/dL
Hgb urine dipstick: NEGATIVE
Ketones, ur: NEGATIVE mg/dL
Leukocytes,Ua: NEGATIVE
Nitrite: NEGATIVE
Protein, ur: NEGATIVE mg/dL
Specific Gravity, Urine: 1.003 — ABNORMAL LOW (ref 1.005–1.030)
pH: 8 (ref 5.0–8.0)

## 2020-01-26 LAB — I-STAT BETA HCG BLOOD, ED (MC, WL, AP ONLY): I-stat hCG, quantitative: <5 m[IU]/mL (ref <5)

## 2020-01-26 LAB — CBC
HCT: 37.1 % (ref 36.0–46.0)
Hemoglobin: 13.1 g/dL (ref 12.0–15.0)
MCH: 29.4 pg (ref 26.0–34.0)
MCHC: 35.3 g/dL (ref 30.0–36.0)
MCV: 83.4 fL (ref 80.0–100.0)
Platelets: 234 10*3/uL (ref 150–400)
RBC: 4.45 MIL/uL (ref 3.87–5.11)
RDW: 15 % (ref 11.5–15.5)
WBC: 10.4 10*3/uL (ref 4.0–10.5)
nRBC: 0 % (ref 0.0–0.2)

## 2020-01-26 LAB — SARS CORONAVIRUS 2 BY RT PCR (HOSPITAL ORDER, PERFORMED IN ~~LOC~~ HOSPITAL LAB): SARS Coronavirus 2: NEGATIVE

## 2020-01-26 LAB — LIPASE, BLOOD: Lipase: 29 U/L (ref 11–51)

## 2020-01-26 LAB — MAGNESIUM: Magnesium: 1.9 mg/dL (ref 1.7–2.4)

## 2020-01-26 MED ORDER — PANCRELIPASE (LIP-PROT-AMYL) 12000-38000 UNITS PO CPEP
24000.0000 [IU] | ORAL_CAPSULE | Freq: Three times a day (TID) | ORAL | Status: DC
  Administered 2020-01-27 – 2020-01-28 (×4): 24000 [IU] via ORAL
  Filled 2020-01-26 (×3): qty 2

## 2020-01-26 MED ORDER — DICYCLOMINE HCL 10 MG/ML IM SOLN: 20.0000 mg | Freq: Once | INTRAMUSCULAR | Status: AC

## 2020-01-26 MED ORDER — POTASSIUM CHLORIDE IN NACL 40-0.9 MEQ/L-% IV SOLN
INTRAVENOUS | Status: DC
  Filled 2020-01-26 (×4): qty 1000

## 2020-01-26 MED ORDER — ONDANSETRON HCL 4 MG/2ML IJ SOLN
4.0000 mg | Freq: Four times a day (QID) | INTRAMUSCULAR | Status: DC | PRN
  Administered 2020-01-26: 4 mg via INTRAVENOUS
  Filled 2020-01-26: qty 2

## 2020-01-26 MED ORDER — POTASSIUM CHLORIDE 10 MEQ/100ML IV SOLN
10.0000 meq | INTRAVENOUS | Status: AC
  Administered 2020-01-26 (×3): 10 meq via INTRAVENOUS
  Filled 2020-01-26 (×2): qty 100

## 2020-01-26 MED ORDER — DICYCLOMINE HCL 10 MG/ML IM SOLN: 20.0000 mg | Freq: Once | INTRAMUSCULAR | Status: DC

## 2020-01-26 MED ORDER — ACETAMINOPHEN 325 MG PO TABS: 650.0000 mg | ORAL_TABLET | Freq: Four times a day (QID) | ORAL | Status: DC | PRN

## 2020-01-26 MED ORDER — POTASSIUM CHLORIDE CRYS ER 20 MEQ PO TBCR
40.0000 meq | EXTENDED_RELEASE_TABLET | ORAL | Status: DC
  Administered 2020-01-26: 40 meq via ORAL
  Filled 2020-01-26: qty 2

## 2020-01-26 MED ORDER — ACETAMINOPHEN 650 MG RE SUPP: 650.0000 mg | Freq: Four times a day (QID) | RECTAL | Status: DC | PRN

## 2020-01-26 MED ORDER — SODIUM CHLORIDE 0.9% FLUSH
3.0000 mL | Freq: Once | INTRAVENOUS | Status: AC
  Administered 2020-01-26: 3 mL via INTRAVENOUS

## 2020-01-26 MED ORDER — BISACODYL 5 MG PO TBEC
5.0000 mg | DELAYED_RELEASE_TABLET | Freq: Every day | ORAL | Status: DC | PRN
  Filled 2020-01-26: qty 1

## 2020-01-26 MED ORDER — POTASSIUM CHLORIDE 10 MEQ/100ML IV SOLN
INTRAVENOUS | Status: AC
  Administered 2020-01-26: 10 meq via INTRAVENOUS
  Filled 2020-01-26: qty 100

## 2020-01-26 MED ORDER — HYDROCODONE-ACETAMINOPHEN 5-325 MG PO TABS
1.0000 | ORAL_TABLET | Freq: Four times a day (QID) | ORAL | Status: DC | PRN
  Administered 2020-01-26 – 2020-01-27 (×2): 1 via ORAL
  Filled 2020-01-26 (×3): qty 1

## 2020-01-26 MED ORDER — POLYETHYLENE GLYCOL 3350 17 G PO PACK: 17.0000 g | PACK | Freq: Every day | ORAL | Status: DC | PRN

## 2020-01-26 MED ORDER — ENOXAPARIN SODIUM 30 MG/0.3ML ~~LOC~~ SOLN
30.0000 mg | SUBCUTANEOUS | Status: DC
  Filled 2020-01-26 (×2): qty 0.3

## 2020-01-26 MED ORDER — POTASSIUM CHLORIDE 10 MEQ/100ML IV SOLN
10.0000 meq | Freq: Once | INTRAVENOUS | Status: AC
  Administered 2020-01-26: 10 meq via INTRAVENOUS
  Filled 2020-01-26: qty 100

## 2020-01-26 MED ORDER — ONDANSETRON HCL 4 MG PO TABS: 4.0000 mg | ORAL_TABLET | Freq: Four times a day (QID) | ORAL | Status: DC | PRN

## 2020-01-26 MED ORDER — DICYCLOMINE HCL 20 MG PO TABS
20.0000 mg | ORAL_TABLET | Freq: Three times a day (TID) | ORAL | Status: DC
  Administered 2020-01-26 – 2020-01-28 (×7): 20 mg via ORAL
  Filled 2020-01-26 (×8): qty 1

## 2020-01-26 NOTE — ED Provider Notes (Signed)
[facility]-EMERGENCY DEPT Provider Note   CSN: [id number] Arrival date & time: 01/26/20  3536     History Chief Complaint  Patient presents with  . Weakness  . Possible low Potassium  . Abdominal Cramping    Sara Hill is a 29 y.o. female with PMHx chronic diarrhea and malnutrition who presents to the ED via EMS with complaint of gradual onset, constant, generalized weakness/fatigue x 1 week.  Patient also complains of diffuse abdominal cramping, persistent diarrhea, nausea.  She states that this typically will happen when her potassium is low.  Patient is prescribed potassium daily however stopped taking it 2 weeks ago.  She states that she works early morning 6 AM shifts and will sometimes forget.  She states when she forgets it will be a while until she remembers again.  States that all of her symptoms are consistent with low potassium.  She states that she has felt like she could pass out however has not passed out fully.  Per chart review patient was admitted in October for potassium of 2.0. Pt denies fevers, chills, vision changes, chest pain, SOB, urinary sx, pelvic pain, vaginal discharge, or any other associated symptoms.   The history is provided by the patient and medical records.       Past Medical History:  Diagnosis Date  . Gall stones   . Low blood potassium     Patient Active Problem List   Diagnosis Date Noted  . Chronic malnutrition (HCC) 06/04/2019  . Chronic diarrhea   . Does not have health insurance   . Acute hypokalemia 01/26/2019  . Dehydration 11/01/2018  . Diarrhea 11/01/2018  . Abdominal pain 11/01/2018  . Weakness 11/01/2018  . Nausea and vomiting   . Severe protein-calorie malnutrition (HCC)   . Protein-calorie malnutrition, severe 06/29/2018  . Hypokalemia 06/27/2018    Past Surgical History:  Procedure Laterality Date  . CHOLECYSTECTOMY    . COLONOSCOPY  01/2018   normal  . ESOPHAGOGASTRODUODENOSCOPY   01/2018   normal     OB History   No obstetric history on file.     Family History  Problem Relation Age of Onset  . Healthy Mother   . Diabetes Mother     Social History   Tobacco Use  . Smoking status: Never Smoker  . Smokeless tobacco: Never Used  Vaping Use  . Vaping Use: Never used  Substance Use Topics  . Alcohol use: No  . Drug use: Yes    Types: Marijuana    Home Medications Prior to Admission medications   Medication Sig Start Date End Date Taking? Authorizing Provider  dicyclomine (BENTYL) 20 MG tablet Take 1 tablet (20 mg total) by mouth 3 (three) times daily before meals. 07/06/19   Marcine Matar, MD  MAGNESIUM PO Take 1 tablet by mouth daily as needed (low magnesium).     [provider]  Multiple Vitamin (MULTIVITAMIN) TABS Take 1 tablet by mouth daily. 07/06/19   Marcine Matar, MD  Pancrelipase, Lip-Prot-Amyl, (CREON) 24000-76000 units CPEP Take 1 capsule (24,000 Units total) by mouth 3 (three) times daily with meals. 07/06/19   Marcine Matar, MD  potassium chloride SA (KLOR-CON) 20 MEQ tablet Take 2 tabs PO Q a.m and 1 tab PO Q p.m 07/06/19   Marcine Matar, MD    Allergies    Benadryl [diphenhydramine hcl]  Review of Systems   Review of Systems  Constitutional: Positive for fatigue. Negative for chills and  fever.  Eyes: Negative for visual disturbance.  Respiratory: Negative for shortness of breath.   Cardiovascular: Negative for chest pain.  Gastrointestinal: Positive for abdominal pain, diarrhea and nausea. Negative for constipation and vomiting.  Neurological: Positive for weakness.  All other systems reviewed and are negative.   Physical Exam Updated Vital Signs BP 104/70   Pulse 67   Temp 98.9 F (37.2 C) (Oral)   Resp 16   LMP 01/19/2020   SpO2 95%   Physical Exam Vitals and nursing note reviewed.  Constitutional:      Appearance: She is not ill-appearing or diaphoretic.  HENT:     Head:  Normocephalic and atraumatic.  Eyes:     Extraocular Movements: Extraocular movements intact.     Conjunctiva/sclera: Conjunctivae normal.     Pupils: Pupils are equal, round, and reactive to light.  Cardiovascular:     Rate and Rhythm: Normal rate and regular rhythm.     Pulses: Normal pulses.  Pulmonary:     Effort: Pulmonary effort is normal.     Breath sounds: Normal breath sounds. No wheezing, rhonchi or rales.  Abdominal:     Palpations: Abdomen is soft.     Tenderness: There is abdominal tenderness. There is no guarding or rebound.     Comments: Soft, diffuse abdominal TTP, +BS throughout, no r/g/r, neg murphy's, neg mcburney's, no CVA TTP  Musculoskeletal:     Cervical back: Neck supple.  Skin:    General: Skin is warm and dry.  Neurological:     Mental Status: She is alert.     ED Results / Procedures / Treatments   Labs (all labs ordered are listed, but only abnormal results are displayed) Labs Reviewed  COMPREHENSIVE METABOLIC PANEL - Abnormal; Notable for the following components:      Result Value   Potassium <2.0 (*)    Glucose, Bld 101 (*)    BUN <5 (*)    Creatinine, Ser 0.35 (*)    Calcium 8.7 (*)    Total Protein 8.3 (*)    Total Bilirubin 1.5 (*)    All other components within normal limits  URINALYSIS, ROUTINE W REFLEX MICROSCOPIC - Abnormal; Notable for the following components:   Color, Urine STRAW (*)    Specific Gravity, Urine 1.003 (*)    All other components within normal limits  SARS CORONAVIRUS 2 BY RT PCR (HOSPITAL ORDER, [place] [facility])  LIPASE, BLOOD  CBC  MAGNESIUM  I-STAT BETA HCG BLOOD, ED ([facility], [facility], [facility] ONLY)    EKG EKG Interpretation  Date/Time:  Wednesday January 26 2020 09:55:55 EDT Ventricular Rate:  62 PR Interval:    QRS Duration: 107 QT Interval:  432 QTC Calculation: 439 R Axis:   95 Text Interpretation: Sinus rhythm Consider RVH w/ secondary repol abnormality 12 Lead; [place]-L[place]ar No significant  change since last tracing Artifact Abnormal ECG Confirmed by Carmin Muskrat [phone]) on 01/26/2020 10:02:26 AM   Radiology No results found.  Procedures .Critical Care Performed by: Eustaquio Maize, PA-C Authorized by: Eustaquio Maize, PA-C   Critical care provider statement:    Critical care time (minutes):  45   Critical care was necessary to treat or prevent imminent or life-threatening deterioration of the following conditions: hypokalemia.   Critical care was time spent personally by me on the following activities:  Discussions with consultants, evaluation of patient's response to treatment, examination of patient, ordering and performing treatments and interventions, ordering and review of laboratory studies, ordering  and review of radiographic studies, pulse oximetry, re-evaluation of patient's condition, obtaining history from patient or surrogate and review of old charts   (including critical care time)  Medications Ordered in ED Medications  potassium chloride 10 mEq in 100 mL IVPB (10 mEq Intravenous New Bag/Given 01/26/20 1118)  enoxaparin (LOVENOX) injection 40 mg (has no administration in time range)  0.9 % NaCl with KCl 40 mEq / L  infusion (has no administration in time range)  acetaminophen (TYLENOL) tablet 650 mg (has no administration in time range)    Or  acetaminophen (TYLENOL) suppository 650 mg (has no administration in time range)  HYDROcodone-acetaminophen (NORCO/VICODIN) 5-325 MG per tablet 1 tablet (has no administration in time range)  ondansetron (ZOFRAN) tablet 4 mg (has no administration in time range)    Or  ondansetron (ZOFRAN) injection 4 mg (has no administration in time range)  bisacodyl (DULCOLAX) EC tablet 5 mg (has no administration in time range)  polyethylene glycol (MIRALAX / GLYCOLAX) packet 17 g (has no administration in time range)  potassium chloride SA (KLOR-CON) CR tablet 40 mEq (has no administration in time range)  sodium chloride flush  (NS) 0.9 % injection 3 mL (3 mLs Intravenous Given 01/26/20 1005)    ED Course  I have reviewed the triage vital signs and the nursing notes.  Pertinent labs & imaging results that were available during my care of the patient were reviewed by me and considered in my medical decision making (see chart for details).  Clinical Course as of Jan 26 1199  Wed Jan 26, 2020  1059 Potassium(!!): <2.0 [MV]    Clinical Course User Index [MV] Tanda Rockers, PA-C   MDM Rules/Calculators/A&P                          29 year old female with a history of chronic diarrhea and malnutrition who presents the ED today complaining of fatigue/generalized weakness for the past week, has not been taking her potassium supplements for the past 2 weeks.  Reports he started feeling fatigue when her potassium is low, she has some nausea and abdominal cramping which she states is consistent with her low potassium.  No vomiting.  Arrival to the ED patient is afebrile, nontachycardic and nontachypneic.  She is sitting comfortably upright in bed and appears to be in no acute distress.  She has some very mild diffuse abdominal tenderness palpation however no focal tenderness and no peritoneal signs, doubt acute abdomen at this time.  Will work-up with CBC, CMP, lipase, UA, beta hCG.  Will obtain EKG as well to look for changes consistent with hypokalemia.   EKG unremarkable. CBC without leukocytosis.  Hemoglobin stable at 13.1.  Nursing staff with critical value of potassium less than 2.0.  Awaiting official CMP at this time.   CMP with potassium reviewed less than 2.0.  Creatinine stable.  No other electrolyte abnormalities.  Will add on mag level.  IV potassium ordered.  Will admit patient for hypokalemia. Remainder of labs unremarkable at this time.   Discussed case with Dr. Denton Lank who agrees to evaluate patient for admission.   This note was prepared using Dragon voice recognition software and may include  unintentional dictation errors due to the inherent limitations of voice recognition software.  Final Clinical Impression(s) / ED Diagnoses Final diagnoses:  Hypokalemia  Generalized weakness    Rx / DC Orders ED Discharge Orders    None  Tanda Rockers, PA-C 01/26/20 1201    Gerhard Munch, MD 01/26/20 [phone]

## 2020-01-26 NOTE — H&P (Signed)
History and Physical    Sara Hill XBM:[id number] DOB: 1990/09/04 DOA: 01/26/2020  PCP: Scot Jun, FNP  Patient coming from: Home  I have personally briefly reviewed patient's old medical records in [place]  Chief Complaint: Abdominal pain  HPI: Sara Hill is a 29 y.o. 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  Patient reports she had been forgetting to take her potassium supplement for several days, and has become progressively weak, fatigued and having muscle cramping.  States she always has diarrhea, and anything she eats or drinks "goes right through me", by which she means oral intake leads to abdominal cramping and diarrhea shortly thereafter.  She states her current abdominal pain is similar to what she usually experiences with low potassium.  She takes Bentyl which she said does help but does not last long enough.  She reports always having chills, no recent fevers.  Denies nausea or vomiting.  Also denies respiratory symptoms including cough, congestion, sore throat or shortness of breath.  Denies chest pain, dysuria, urinary frequency, headaches.  ED Course: Normal vital signs, borderline BP 104/70.  Labs notable for potassium <2.0, total bili 1.5, BUN <5, creatinine 0.35, UA negative for infection.   During admission encounter, patient developed severe abdominal cramping shortly after taking a potassium pill.  Treated with IM Bentyl.  Admitted to hospitalist service for further evaluation and management of hypokalemia.    Review of Systems: As per HPI otherwise 10 point review of systems negative.    Past Medical History:  Diagnosis Date  . Gall stones   . Low blood potassium     Past Surgical History:  Procedure Laterality Date  . CHOLECYSTECTOMY    . COLONOSCOPY   01/2018   normal  . ESOPHAGOGASTRODUODENOSCOPY  01/2018   normal     reports that she has never smoked. She has never used smokeless tobacco. She reports current drug use. Drug: Marijuana. She reports that she does not drink alcohol.  Allergies  Allergen Reactions  . Benadryl [Diphenhydramine Hcl] Itching and Swelling    Pt reeports "feels like throat swelling".     Family History  Problem Relation Age of Onset  . Healthy Mother   . Diabetes Mother      Prior to Admission medications   Medication Sig Start Date End Date Taking? Authorizing Provider  dicyclomine (BENTYL) 20 MG tablet Take 1 tablet (20 mg total) by mouth 3 (three) times daily before meals. 07/06/19   Ladell Pier, MD  MAGNESIUM PO Take 1 tablet by mouth daily as needed (low magnesium).     [provider]  Multiple Vitamin (MULTIVITAMIN) TABS Take 1 tablet by mouth daily. 07/06/19   Ladell Pier, MD  Pancrelipase, Lip-Prot-Amyl, (CREON) 24000-76000 units CPEP Take 1 capsule (24,000 Units total) by mouth 3 (three) times daily with meals. 07/06/19   Ladell Pier, MD  potassium chloride SA (KLOR-CON) 20 MEQ tablet Take 2 tabs PO Q a.m and 1 tab PO Q p.m 07/06/19   Ladell Pier, MD    Physical Exam: Vitals:   01/26/20 0939 01/26/20 1222 01/26/20 1312 01/26/20 1337  BP: 104/70 96/64 99/63    Pulse: 67 65 67   Resp: 16 15 16    Temp: 98.9 F (37.2 C)  98.1 F (36.7 C)   TempSrc: Oral  Oral   SpO2:  95% 100% 100%   Weight:    35.5 kg  Height:   5' (1.524 m)      Constitutional: Underweight, NAD, calm, comfortable Eyes: EOMI, lids and conjunctivae normal ENMT: Mucous membranes are moist. Posterior pharynx clear of any exudate or lesions.hearing grossly normal Respiratory: CTAB, no wheezing, no crackles. Normal respiratory effort. No accessory muscle use.  Cardiovascular: RRR, no murmurs / rubs / gallops. No extremity edema. 2+ pedal pulses.  Abdomen: soft, nondistended,  diffusely tender, unable to appreciate abdominal wall muscle spasm, no masses or HSM palpated. +Bowel sounds.  Musculoskeletal: no clubbing / cyanosis. No joint deformity upper and lower extremities. Normal muscle tone.  Skin: dry, intact, normal color, normal temperature Neurologic: CN 2-12 grossly intact. Normal speech.  Grossly non-focal exam. Psychiatric: Alert and oriented x 3. Normal mood. Congruent affect.  Normal judgement and insight.   Labs on Admission: I have personally reviewed following labs and imaging studies  CBC: Recent Labs  Lab 01/26/20 1001  WBC 10.4  HGB 13.1  HCT 37.1  MCV 83.4  PLT 234   Basic Metabolic Panel: Recent Labs  Lab 01/26/20 1001  NA 140  K <2.0*  CL 108  CO2 22  GLUCOSE 101*  BUN <5*  CREATININE 0.35*  CALCIUM 8.7*  MG 1.9   GFR: Estimated Creatinine Clearance: 57.6 mL/min (A) (by C-G formula based on SCr of 0.35 mg/dL (L)). Liver Function Tests: Recent Labs  Lab 01/26/20 1001  AST 23  ALT 22  ALKPHOS 66  BILITOT 1.5*  PROT 8.3*  ALBUMIN 4.6   Recent Labs  Lab 01/26/20 1001  LIPASE 29   No results for input(s): AMMONIA in the last 168 hours. Coagulation Profile: No results for input(s): INR, PROTIME in the last 168 hours. Cardiac Enzymes: No results for input(s): CKTOTAL, CKMB, CKMBINDEX, TROPONINI in the last 168 hours. BNP (last 3 results) No results for input(s): PROBNP in the last 8760 hours. HbA1C: No results for input(s): HGBA1C in the last 72 hours. CBG: No results for input(s): GLUCAP in the last 168 hours. Lipid Profile: No results for input(s): CHOL, HDL, LDLCALC, TRIG, CHOLHDL, LDLDIRECT in the last 72 hours. Thyroid Function Tests: No results for input(s): TSH, T4TOTAL, FREET4, T3FREE, THYROIDAB in the last 72 hours. Anemia Panel: No results for input(s): VITAMINB12, FOLATE, FERRITIN, TIBC, IRON, RETICCTPCT in the last 72 hours. Urine analysis:    Component Value Date/Time   COLORURINE STRAW (A)  01/26/2020 0957   APPEARANCEUR CLEAR 01/26/2020 0957   LABSPEC 1.003 (L) 01/26/2020 0957   PHURINE 8.0 01/26/2020 0957   GLUCOSEU NEGATIVE 01/26/2020 0957   HGBUR NEGATIVE 01/26/2020 0957   BILIRUBINUR NEGATIVE 01/26/2020 0957   KETONESUR NEGATIVE 01/26/2020 0957   PROTEINUR NEGATIVE 01/26/2020 0957   UROBILINOGEN 1.0 11/26/2014 1435   NITRITE NEGATIVE 01/26/2020 0957   LEUKOCYTESUR NEGATIVE 01/26/2020 0957    Radiological Exams on Admission: No results found.  EKG: Independently reviewed.   Assessment/Plan Principal Problem:   Hypokalemia Active Problems:   Abdominal pain   Chronic diarrhea   Generalized weakness    Hypokalemia -present on admission K<2.0, secondary to chronic diarrhea and patient forgetting to take for routine potassium supplement.   --Replacing by IV (did not tolerate oral due to cramping after any PO intake) --monitor K and Mg, replace as needed  --Monitor on telemetry  Abdominal pain -present on admission, cramping in nature, likely secondary to hypokalemia as patient has experienced same pain during prior episodes of this including prior  admissions.  IM Bentyl was given in the ED after patient swallowed 1 potassium pill and had severe cramping. --Continue home oral Bentyl --Consider IM Bentyl if needed  Chronic diarrhea -patient has constant diarrhea since cholecystectomy.  Generalized weakness -secondary to hypokalemia and poor p.o. intake.  Patient is young and independent at baseline, does not require PT evaluation.  Expect improvement as potassium normalizes.  Continuity of care - TOC consulted, patient reports inability to afford medications.  DVT prophylaxis:  Lovenox Code Status: full  Family Communication: friend at bedside during encounter  Disposition Plan: d/c home pending stable electrolytes  Consults called: none  Admission status: inpatient  Status is: Inpatient  Remains inpatient appropriate because:Persistent severe  electrolyte disturbances   Dispo: The patient is from: Home              Anticipated d/c is to: Home              Anticipated d/c date is: 2 days              Patient currently is not medically stable to d/c.    Pennie Banter, DO [facility]  01/26/2020, 3:48 PM    If 7PM-7AM, please contact night-coverage. How to contact the [facility] Attending or Consulting provider 7A - 7P or covering provider during after hours 7P -7A, for this patient?    1. Check the care team in [facility] and look for a) attending/consulting [facility] provider listed and b) the [facility] team listed 2. Log into [email or web address] and use [place]'s universal password to access. If you do not have the password, please contact the hospital operator. 3. Locate the [facility] provider you are looking for under [facility] and page to a number that you can be directly reached. 4. If you still have difficulty reaching the provider, please page the [facility] (Director on Call) for the Hospitalists listed on amion for assistance.

## 2020-01-26 NOTE — ED Triage Notes (Signed)
Patient arrived via [facility] from work.   Pt reports she has not been taking her potassium pills. Patient states she sometimes forgets to take her pills and sometimes causes her to have more diarrhea.   C/o weakness, fatigue, diarrhea, and cramping.   Recent hospitalization in January for low potassium.    A/Ox4 Ambulatory in triage.

## 2020-01-26 NOTE — Progress Notes (Signed)
Pt bed assignment was delayed by bed placement. Report received from [facility], ED-RN.

## 2020-01-26 NOTE — ED Notes (Signed)
Date and time results received: 01/26/20 10:56 AM  (use smartphrase ".now" to insert current time)  Test: K+ Critical Value: < 2.0  Name of Provider Notified: P.A. Margaux  Orders Received? Or Actions Taken?:

## 2020-01-26 NOTE — ED Notes (Signed)
Pt ambulatory to [facility] for UA.

## 2020-01-27 ENCOUNTER — Other Ambulatory Visit: Payer: Self-pay

## 2020-01-27 DIAGNOSIS — E43 Unspecified severe protein-calorie malnutrition: Secondary | ICD-10-CM

## 2020-01-27 DIAGNOSIS — K529 Noninfective gastroenteritis and colitis, unspecified: Secondary | ICD-10-CM

## 2020-01-27 DIAGNOSIS — E876 Hypokalemia: Principal | ICD-10-CM

## 2020-01-27 DIAGNOSIS — R1084 Generalized abdominal pain: Secondary | ICD-10-CM

## 2020-01-27 DIAGNOSIS — R531 Weakness: Secondary | ICD-10-CM

## 2020-01-27 LAB — BASIC METABOLIC PANEL
Anion gap: 9 (ref 5–15)
BUN: <5 mg/dL — ABNORMAL LOW (ref 6–20)
CO2: 19 mmol/L — ABNORMAL LOW (ref 22–32)
Calcium: 7.9 mg/dL — ABNORMAL LOW (ref 8.9–10.3)
Chloride: 111 mmol/L (ref 98–111)
Creatinine, Ser: 0.36 mg/dL — ABNORMAL LOW (ref 0.44–1.00)
GFR calc Af Amer: >60 mL/min (ref >60)
GFR calc non Af Amer: >60 mL/min (ref >60)
Glucose, Bld: 111 mg/dL — ABNORMAL HIGH (ref 70–99)
Potassium: 2.8 mmol/L — ABNORMAL LOW (ref 3.5–5.1)
Sodium: 139 mmol/L (ref 135–145)

## 2020-01-27 LAB — MAGNESIUM: Magnesium: 1.9 mg/dL (ref 1.7–2.4)

## 2020-01-27 MED ORDER — HYDROCODONE-ACETAMINOPHEN 5-325 MG PO TABS: 1.0000 | ORAL_TABLET | ORAL | Status: DC | PRN

## 2020-01-27 MED ORDER — DEXTROSE IN LACTATED RINGERS 5 % IV SOLN: INTRAVENOUS | Status: DC

## 2020-01-27 MED ORDER — MAGNESIUM SULFATE 2 GM/50ML IV SOLN
2.0000 g | Freq: Once | INTRAVENOUS | Status: AC
  Administered 2020-01-27: 2 g via INTRAVENOUS
  Filled 2020-01-27: qty 50

## 2020-01-27 MED ORDER — SODIUM BICARBONATE 650 MG PO TABS
650.0000 mg | ORAL_TABLET | Freq: Three times a day (TID) | ORAL | Status: DC
  Administered 2020-01-27 – 2020-01-28 (×3): 650 mg via ORAL
  Filled 2020-01-27 (×3): qty 1

## 2020-01-27 MED ORDER — POTASSIUM CHLORIDE CRYS ER 20 MEQ PO TBCR
40.0000 meq | EXTENDED_RELEASE_TABLET | ORAL | Status: AC
  Administered 2020-01-27 (×2): 40 meq via ORAL
  Filled 2020-01-27 (×2): qty 2

## 2020-01-27 MED ORDER — PANTOPRAZOLE SODIUM 40 MG PO TBEC
40.0000 mg | DELAYED_RELEASE_TABLET | Freq: Every day | ORAL | Status: DC
  Administered 2020-01-27 – 2020-01-28 (×2): 40 mg via ORAL
  Filled 2020-01-27 (×2): qty 1

## 2020-01-27 MED ORDER — LOPERAMIDE HCL 2 MG PO CAPS
2.0000 mg | ORAL_CAPSULE | ORAL | Status: DC | PRN
  Administered 2020-01-27: 2 mg via ORAL
  Filled 2020-01-27: qty 1

## 2020-01-27 NOTE — TOC Initial Note (Signed)
Transition of Care [facility]) - Initial/Assessment Note    Patient Details  Name: Sara Hill MRN: [id number] Date of Birth: 1990/10/07  [facility] of [facility]) CM/SW Contact:    Ida Rogue, LCSW Phone Number: 01/27/2020, 10:55 AM  Clinical Narrative:   Patient states she is unable to afford Creon without insurance.  Her cost is over $800.00 at [facility].  She has not gone to the [facility] for help, nor has she filled out her [employer] and submitted it.  I left message for Erskine Squibb at [facility], and will track down OC application for patient.  She states she is employed, and lives her in [place] with her significant other. TOC will continue to follow during the course of hospitalization.                 Expected Discharge Plan: Home/Self Care Barriers to Discharge: No Barriers Identified   Patient Goals and CMS Choice        Expected Discharge Plan and Services Expected Discharge Plan: Home/Self Care In-house Referral: Clinical Social Work     Living arrangements for the past 2 months: Apartment                                      Prior Living Arrangements/Services Living arrangements for the past 2 months: Apartment Lives with:: Significant Other Patient language and need for interpreter reviewed:: Yes        Need for Family Participation in Patient Care: Yes (Comment) Care giver support system in place?: Yes (comment)   Criminal Activity/Legal Involvement Pertinent to Current Situation/Hospitalization: No - Comment as needed  Activities of Daily Living Home Assistive Devices/Equipment: None ADL Screening (condition at time of admission) Patient's cognitive ability adequate to safely complete daily activities?: Yes Is the patient deaf or have difficulty hearing?: No Does the patient have difficulty seeing, even when wearing glasses/contacts?: No Does the patient have difficulty concentrating, remembering, or making decisions?:  No Patient able to express need for assistance with ADLs?: Yes Does the patient have difficulty dressing or bathing?: No Independently performs ADLs?: Yes (appropriate for developmental age) Does the patient have difficulty walking or climbing stairs?: Yes (secondary to weakenss) Weakness of Legs: Both Weakness of Arms/Hands: None  Permission Sought/Granted                  Emotional Assessment Appearance:: Appears stated age Attitude/Demeanor/Rapport: Engaged Affect (typically observed): Appropriate Orientation: : Oriented to Self, Oriented to Place, Oriented to  Time, Oriented to Situation Alcohol / Substance Use: Not Applicable Psych Involvement: No (comment)  Admission diagnosis:  Hypokalemia [E87.6] Generalized weakness [R53.1] Patient Active Problem List   Diagnosis Date Noted  . Chronic malnutrition (HCC) 06/04/2019  . Chronic diarrhea   . Does not have health insurance   . Acute hypokalemia 01/26/2019  . Dehydration 11/01/2018  . Abdominal pain 11/01/2018  . Generalized weakness 11/01/2018  . Nausea and vomiting   . Protein-calorie malnutrition, severe 06/29/2018  . Hypokalemia 06/27/2018   PCP:  Bing Neighbors, FNP Pharmacy:   [facility] - [place], [place] - [address] [place] [address] Phone: [phone] Fax: [phone]  [id number] - [place], [place] - [address] AT [facility] [address]  [place] [address] Phone: [phone] Fax: [phone]  Social Determinants of Health (SDOH) Interventions    Readmission Risk Interventions No flowsheet data found.

## 2020-01-27 NOTE — Progress Notes (Signed)
PROGRESS NOTE    Sara Hill  UXL:[id number] DOB: Nov 10, 1990 DOA: 01/26/2020 PCP: Scot Jun, FNP    Brief Narrative:  Patient was admitted to the hospital with a working diagnosis of severe hypokalemia.  29 year old female who presented with abdominal pain.  She has history of chronic diarrhea, chronic hypokalemia and severe calorie protein malnutrition.  Apparently patient not compliant with potassium tablets.  She developed progressive weakness, fatigue and muscle cramps.  On her initial physical examination her blood pressure was 104/70, heart rate 67, respirate 16, temperature 98.9, oxygen saturation 95%, her lungs are clear to auscultation bilaterally, heart S1-S2 present and rhythmic, soft abdomen, no lower extremity edema. Sodium 140, potassium 2.0, chloride 108, bicarb 22, glucose 101, BUN less than 5, creatinine 0.35, AST 23, ALT 22, white count 10.4, hemoglobin 13.1, hematocrit 37.1, platelets 234.   Assessment & Plan:   Principal Problem:   Hypokalemia Active Problems:   Abdominal pain   Generalized weakness   Chronic diarrhea   1. Hypokalemia, hypomagnesemia and non gap metabolic acidosis. Due to GI losses. Continue to have severe hypokalemia with K down to 2,8. Renal function with serum cr at 0,36 and serum bicarbonate at 19. Patient continue to have watery diarrhea, x4 times today.   Will continue hydration with balanced electrolyte solutions with D5 LR at 75 ml per H, continue k correction with Kcl 40 meq x2 and 2 g of Mag sulfate. Will add oral bicarbonate tid with meals. Follow up renal panel in am.   Continue telemetry monitoring while hypokalemia persists. Discontinue laxatives.    2. Chronic diarrhea with severe calorie protein malnutrition. Will continue fluids and electrolyte correction. As needed imodium and consult nutrition for supplements.   3. Chronic abdominal pain. Continue with bentyl, and hydrocodone, will increase dose to q4 H as  needed.   Status is: Inpatient  Remains inpatient appropriate because:IV treatments appropriate due to intensity of illness or inability to take PO   Dispo: The patient is from: Home              Anticipated d/c is to: Home              Anticipated d/c date is: 1 day              Patient currently is not medically stable to d/c.   DVT prophylaxis: Enoxaparin   Code Status:   full  Family Communication:  No family at the bedside     Subjective: Patient continue to have diarrhea x4 times today with no nausea or vomiting, but positive abdominal pain.   Objective: Vitals:   01/26/20 2120 01/27/20 0137 01/27/20 0519 01/27/20 1440  BP: 91/65 90/62 (!) 91/53 101/68  Pulse: 60 64 67 83  Resp: 18 18 18    Temp: 98 F (36.7 C)  98.4 F (36.9 C)   TempSrc: Oral  Oral   SpO2: 100% 100% 100% 100%  Weight:      Height:        Intake/Output Summary (Last 24 hours) at 01/27/2020 1529 Last data filed at 01/26/2020 1800 Gross per 24 hour  Intake 668.32 ml  Output --  Net 668.32 ml   Filed Weights   01/26/20 1337  Weight: 35.5 kg    Examination:   General: Not in pain or dyspnea, deconditioned  Neurology: Awake and alert, non focal  E ENT: no pallor, no icterus, oral mucosa moist Cardiovascular: No JVD. S1-S2 present, rhythmic, no gallops, rubs, or murmurs. No  lower extremity edema. Pulmonary: positive breath sounds bilaterally, adequate air movement, no wheezing, rhonchi or rales. Gastrointestinal. Abdomen with, no organomegaly, non tender, no rebound or guarding Skin. No rashes Musculoskeletal: no joint deformities     Data Reviewed: I have personally reviewed following labs and imaging studies  CBC: Recent Labs  Lab 01/26/20 1001  WBC 10.4  HGB 13.1  HCT 37.1  MCV 83.4  PLT 234   Basic Metabolic Panel: Recent Labs  Lab 01/26/20 1001 01/27/20 0336  NA 140 139  K <2.0* 2.8*  CL 108 111  CO2 22 19*  GLUCOSE 101* 111*  BUN <5* <5*  CREATININE 0.35* 0.36*   CALCIUM 8.7* 7.9*  MG 1.9 1.9   GFR: Estimated Creatinine Clearance: 57.6 mL/min (A) (by C-G formula based on SCr of 0.36 mg/dL (L)). Liver Function Tests: Recent Labs  Lab 01/26/20 1001  AST 23  ALT 22  ALKPHOS 66  BILITOT 1.5*  PROT 8.3*  ALBUMIN 4.6   Recent Labs  Lab 01/26/20 1001  LIPASE 29   No results for input(s): AMMONIA in the last 168 hours. Coagulation Profile: No results for input(s): INR, PROTIME in the last 168 hours. Cardiac Enzymes: No results for input(s): CKTOTAL, CKMB, CKMBINDEX, TROPONINI in the last 168 hours. BNP (last 3 results) No results for input(s): PROBNP in the last 8760 hours. HbA1C: No results for input(s): HGBA1C in the last 72 hours. CBG: No results for input(s): GLUCAP in the last 168 hours. Lipid Profile: No results for input(s): CHOL, HDL, LDLCALC, TRIG, CHOLHDL, LDLDIRECT in the last 72 hours. Thyroid Function Tests: No results for input(s): TSH, T4TOTAL, FREET4, T3FREE, THYROIDAB in the last 72 hours. Anemia Panel: No results for input(s): VITAMINB12, FOLATE, FERRITIN, TIBC, IRON, RETICCTPCT in the last 72 hours.    Radiology Studies: I have reviewed all of the imaging during this hospital visit personally     Scheduled Meds: . dicyclomine  20 mg Intramuscular Once  . dicyclomine  20 mg Oral TID AC & HS  . enoxaparin (LOVENOX) injection  30 mg Subcutaneous Q24H  . lipase/protease/amylase  24,000 Units Oral TID WC  . potassium chloride  40 mEq Oral Q4H   Continuous Infusions: . dextrose 5% lactated ringers 75 mL/hr at 01/27/20 1456  . magnesium sulfate bolus IVPB       LOS: 1 day        Anilah Huck Annett Gula, MD

## 2020-01-28 ENCOUNTER — Telehealth: Payer: Self-pay | Admitting: Internal Medicine

## 2020-01-28 LAB — BASIC METABOLIC PANEL
Anion gap: 7 (ref 5–15)
BUN: <5 mg/dL — ABNORMAL LOW (ref 6–20)
CO2: 20 mmol/L — ABNORMAL LOW (ref 22–32)
Calcium: 8.4 mg/dL — ABNORMAL LOW (ref 8.9–10.3)
Chloride: 113 mmol/L — ABNORMAL HIGH (ref 98–111)
Creatinine, Ser: 0.39 mg/dL — ABNORMAL LOW (ref 0.44–1.00)
GFR calc Af Amer: >60 mL/min (ref >60)
GFR calc non Af Amer: >60 mL/min (ref >60)
Glucose, Bld: 122 mg/dL — ABNORMAL HIGH (ref 70–99)
Potassium: 3.2 mmol/L — ABNORMAL LOW (ref 3.5–5.1)
Sodium: 140 mmol/L (ref 135–145)

## 2020-01-28 LAB — MAGNESIUM: Magnesium: 2.2 mg/dL (ref 1.7–2.4)

## 2020-01-28 MED ORDER — CREON 24000-76000 UNITS PO CPEP: 1.0000 | ORAL_CAPSULE | Freq: Three times a day (TID) | ORAL | 0 refills | Status: DC

## 2020-01-28 MED ORDER — POTASSIUM CHLORIDE CRYS ER 20 MEQ PO TBCR
40.0000 meq | EXTENDED_RELEASE_TABLET | ORAL | Status: AC
  Administered 2020-01-28 (×2): 40 meq via ORAL
  Filled 2020-01-28 (×2): qty 2

## 2020-01-28 MED ORDER — LOPERAMIDE HCL 2 MG PO CAPS: 2.0000 mg | ORAL_CAPSULE | ORAL | 0 refills | Status: DC | PRN

## 2020-01-28 MED ORDER — PANCREAZE 21000-54700 UNITS PO CPEP: 21000.0000 [IU] | ORAL_CAPSULE | Freq: Three times a day (TID) | ORAL | 0 refills | Status: AC

## 2020-01-28 MED ORDER — SODIUM BICARBONATE 650 MG PO TABS: 650.0000 mg | ORAL_TABLET | Freq: Three times a day (TID) | ORAL | 0 refills | Status: DC

## 2020-01-28 MED FILL — !ZENPEP 20000-63000 UNIT CA: 20000-63000 | 33 days supply | Qty: 100 | Fill #0

## 2020-01-28 NOTE — Telephone Encounter (Signed)
Hey Dr. Delrae Sawyers! It's Shayla again from [facility] and [employer], messaging you about Curstin Schmale. The Prescription still came over as Creon 2100-54700 Units. I was just messaging you to let you know that I wrote it down as a verbal for Zenpap 20,000 Units, 1 cap by mouth with breakfast, lunch, and evening meal. So that you can document it. If that is something you do not want, then you can reach me at [phone]. Thank you so much! Have a great weekend.

## 2020-01-28 NOTE — Progress Notes (Signed)
PT Cancellation Note  Patient Details Name: Sara Hill MRN: [id number] DOB: 1991/05/22   Cancelled Treatment:    Reason Eval/Treat Not Completed: Patient declined, no reason specified . Upon arrival, nurse tech reports pt has been ambulating around room and getting washed up in bathroom independently. Pt standing at sink washing face and fully dressed upon therapist arrival. Pt reports feeling at baseline, declines need for therapy, and states "I'm about to be discharged". Will sign off at this time, thank you.    Domenick Bookbinder PT, DPT 01/28/20, 9:24 AM

## 2020-01-28 NOTE — Progress Notes (Signed)
OT Cancellation Note  Patient Details Name: Sara Hill MRN: [id number] DOB: Jun 22, 1991   Cancelled Treatment:    Reason Eval/Treat Not Completed: OT screened, no needs identified, will sign off  Evern Bio 01/28/2020, 11:24 AM

## 2020-01-28 NOTE — Discharge Summary (Addendum)
Physician Discharge Summary  Sara Hill YWV:[id number] DOB: 07-08-1991 DOA: 01/26/2020  PCP: Scot Jun, FNP  Admit date: 01/26/2020 Discharge date: 01/28/2020  Admitted From: Home  Disposition:  Home   Recommendations for Outpatient Follow-up and new medication changes:  1. Follow up with [name] FNP in 7 days.  2. Continue Kcl supplementation 3. Added sodium bicarbonate tid for non anion gap metabolic acidosis. 4. Follow up renal panel in 7 days.   Home Health: no   Equipment/Devices: no    Discharge Condition: stable  CODE STATUS: full  Diet recommendation: regular   Brief/Interim Summary: Patient was admitted to the hospital with a working diagnosis of severe hypokalemia.  29 year old female who presented with abdominal pain.  She has history of chronic diarrhea, chronic hypokalemia and severe calorie protein malnutrition.  Apparently patient not compliant with potassium tablets.  She developed progressive weakness, fatigue and muscle cramps.  On her initial physical examination her blood pressure was 104/70, heart rate 67, respiratory rate 16, temperature 98.9, oxygen saturation 95%, her lungs were clear to auscultation bilaterally, heart S1-S2 present and rhythmic, soft abdomen, no lower extremity edema. Sodium 140, potassium 2.0, chloride 108, bicarb 22, glucose 101, BUN less than 5, creatinine 0.35, AST 23, ALT 22, white count 10.4, hemoglobin 13.1, hematocrit 37.1, platelets 234.  SARS COVID-19 negative.  Urine analysis specific gravity 1.003.  EKG 62 bpm, normal axis, normal intervals, sinus rhythm, no ST segment or T wave changes, positive U waves.  1.  Hypokalemia, hypomagnesemia and non-anion gap metabolic acidosis.  Electrolyte disbalance due to gastrointestinal losses.  She was admitted to the medical ward with remote telemetry monitoring.  Patient received intravenous fluids along with potassium and magnesium supplements.  Patient continued to have  intermittent diarrhea.  Stable p.o. intake, with no nausea or vomiting.  Her discharge sodium is 140, potassium 3.2, chloride 113, bicarb 20, BUN less than 5 and creatinine 0.39.  Patient will received 80 mEq of potassium chloride in 2 divided doses before discharge.  Patient will continue taking sodium bicarb 3 times daily.  Recommend follow-up renal panel and electrolytes within 7 days.  At discharge she will continue taking K and Mg supplements.   2.  Chronic diarrhea with severe calorie protein malnutrition, present on admission.  Her diarrhea has stabilized, continue to be intermittent.  Patient taken Bentyl at home, added loperamide as needed with good toleration.  Patient will continue taking protein supplements.  Patient will continue taking Creon.  She does have difficulty accessing his medication due to elevated cost.  3.  Chronic abdominal pain.  Her abdominal pain improved during her hospitalization, patient will follow-up as an outpatient.   Discharge Diagnoses:  Principal Problem:   Hypokalemia Active Problems:   Abdominal pain   Generalized weakness   Chronic diarrhea    Discharge Instructions   Allergies as of 01/28/2020      Reactions   Benadryl [diphenhydramine Hcl] Itching, Swelling   Pt reeports "feels like throat swelling".      Medication List    STOP taking these medications   Creon 24000-76000 units Cpep Generic drug: Pancrelipase (Lip-Prot-Amyl) Replaced by: Pancreaze 21000-54700 units Cpep     TAKE these medications   dicyclomine 20 MG tablet Commonly known as: Bentyl Take 1 tablet (20 mg total) by mouth 3 (three) times daily before meals.   loperamide 2 MG capsule Commonly known as: IMODIUM Take 1 capsule (2 mg total) by mouth every 4 (four) hours as needed for  diarrhea or loose stools.   MAGNESIUM PO Take 1 tablet by mouth daily as needed (low magnesium).   Multivitamin Tabs Take 1 tablet by mouth daily.   Pancreaze 21000-54700 units  Cpep Generic drug: Pancrelipase (Lip-Prot-Amyl) Take 21,000 Units by mouth with breakfast, with lunch, and with evening meal. Replaces: Creon 24000-76000 units Cpep   potassium chloride SA 20 MEQ tablet Commonly known as: KLOR-CON Take 2 tabs PO Q a.m and 1 tab PO Q p.m What changed:   how much to take  how to take this  when to take this  additional instructions   sodium bicarbonate 650 MG tablet Take 1 tablet (650 mg total) by mouth 3 (three) times daily.       Follow-up Information    Bing Neighbors, FNP Follow up.   Specialty: Family Medicine Contact information: [address] [place] [address] [phone]        [facility] Follow up.   Contact information: [address] [place] [address]             Allergies  Allergen Reactions  . Benadryl [Diphenhydramine Hcl] Itching and Swelling    Pt reeports "feels like throat swelling".        Procedures/Studies:  No results found.     Subjective: Patient is feeling better, continue to have intermittent diarrhea, no nausea or vomiting and abdominal pain has improved.   Discharge Exam: Vitals:   01/27/20 2051 01/28/20 0509  BP: 103/73 103/63  Pulse: 67 70  Resp: 18 18  Temp: 98.3 F (36.8 C) 98.9 F (37.2 C)  SpO2: 100% 100%   Vitals:   01/27/20 0519 01/27/20 1440 01/27/20 2051 01/28/20 0509  BP: (!) 91/53 101/68 103/73 103/63  Pulse: 67 83 67 70  Resp: 18 18 18 18   Temp: 98.4 F (36.9 C) 97.6 F (36.4 C) 98.3 F (36.8 C) 98.9 F (37.2 C)  TempSrc: Oral  Oral Oral  SpO2: 100% 100% 100% 100%  Weight:      Height:        General: Not in pain or dyspnea.  Neurology: Awake and alert, non focal  E ENT: no pallor, no icterus, oral mucosa moist Cardiovascular: No JVD. S1-S2 present, rhythmic, no gallops, rubs, or murmurs. No lower extremity edema. Pulmonary: positive breath sounds bilaterally, adequate air movement, no  wheezing, rhonchi or rales. Gastrointestinal. Abdomen with no organomegaly, non tender, no rebound or guarding Skin. No rashes Musculoskeletal: no joint deformities/ decreased muscle mass.    The results of significant diagnostics from this hospitalization (including imaging, microbiology, ancillary and laboratory) are listed below for reference.     Microbiology: Recent Results (from the past 240 hour(s))  SARS Coronavirus 2 by RT PCR (hospital order, performed in [facility] hospital lab) Nasopharyngeal Nasopharyngeal Swab     Status: None   Collection Time: 01/26/20 11:00 AM   Specimen: Nasopharyngeal Swab  Result Value Ref Range Status   SARS Coronavirus 2 NEGATIVE NEGATIVE Final    Comment: (NOTE) SARS-CoV-2 target nucleic acids are NOT DETECTED.  The SARS-CoV-2 RNA is generally detectable in upper and lower respiratory specimens during the acute phase of infection. The lowest concentration of SARS-CoV-2 viral copies this assay can detect is 250 copies / mL. A negative result does not preclude SARS-CoV-2 infection and should not be used as the sole basis for treatment or other patient management decisions.  A negative result may occur with improper specimen collection /  handling, submission of specimen other than nasopharyngeal swab, presence of viral mutation(s) within the areas targeted by this assay, and inadequate number of viral copies (<250 copies / mL). A negative result must be combined with clinical observations, patient history, and epidemiological information.  Fact Sheet for Patients:   BoilerBrush.com.cy  Fact Sheet for Healthcare Providers: https://pope.com/  This test is not yet approved or  cleared by the [place] FDA and has been authorized for detection and/or diagnosis of SARS-CoV-2 by FDA under an Emergency Use Authorization (EUA).  This EUA will remain in effect (meaning this test can be used) for  the duration of the COVID-19 declaration under Section 564(b)(1) of the Act, 21 U.S.C. section 360bbb-3(b)(1), unless the authorization is terminated or revoked sooner.  Performed at [facility], [address] [place], [place] [address]      Labs: BNP (last 3 results) No results for input(s): BNP in the last 8760 hours. Basic Metabolic Panel: Recent Labs  Lab 01/26/20 1001 01/27/20 0336 01/28/20 0357  NA 140 139 140  K <2.0* 2.8* 3.2*  CL 108 111 113*  CO2 22 19* 20*  GLUCOSE 101* 111* 122*  BUN <5* <5* <5*  CREATININE 0.35* 0.36* 0.39*  CALCIUM 8.7* 7.9* 8.4*  MG 1.9 1.9 2.2   Liver Function Tests: Recent Labs  Lab 01/26/20 1001  AST 23  ALT 22  ALKPHOS 66  BILITOT 1.5*  PROT 8.3*  ALBUMIN 4.6   Recent Labs  Lab 01/26/20 1001  LIPASE 29   No results for input(s): AMMONIA in the last 168 hours. CBC: Recent Labs  Lab 01/26/20 1001  WBC 10.4  HGB 13.1  HCT 37.1  MCV 83.4  PLT 234   Cardiac Enzymes: No results for input(s): CKTOTAL, CKMB, CKMBINDEX, TROPONINI in the last 168 hours. BNP: Invalid input(s): POCBNP CBG: No results for input(s): GLUCAP in the last 168 hours. D-Dimer No results for input(s): DDIMER in the last 72 hours. Hgb A1c No results for input(s): HGBA1C in the last 72 hours. Lipid Profile No results for input(s): CHOL, HDL, LDLCALC, TRIG, CHOLHDL, LDLDIRECT in the last 72 hours. Thyroid function studies No results for input(s): TSH, T4TOTAL, T3FREE, THYROIDAB in the last 72 hours.  Invalid input(s): FREET3 Anemia work up No results for input(s): VITAMINB12, FOLATE, FERRITIN, TIBC, IRON, RETICCTPCT in the last 72 hours. Urinalysis    Component Value Date/Time   COLORURINE STRAW (A) 01/26/2020 0957   APPEARANCEUR CLEAR 01/26/2020 0957   LABSPEC 1.003 (L) 01/26/2020 0957   PHURINE 8.0 01/26/2020 0957   GLUCOSEU NEGATIVE 01/26/2020 0957   HGBUR NEGATIVE 01/26/2020 0957   BILIRUBINUR NEGATIVE 01/26/2020  0957   KETONESUR NEGATIVE 01/26/2020 0957   PROTEINUR NEGATIVE 01/26/2020 0957   UROBILINOGEN 1.0 11/26/2014 1435   NITRITE NEGATIVE 01/26/2020 0957   LEUKOCYTESUR NEGATIVE 01/26/2020 0957   Sepsis Labs Invalid input(s): PROCALCITONIN,  WBC,  LACTICIDVEN Microbiology Recent Results (from the past 240 hour(s))  SARS Coronavirus 2 by RT PCR (hospital order, performed in [facility] hospital lab) Nasopharyngeal Nasopharyngeal Swab     Status: None   Collection Time: 01/26/20 11:00 AM   Specimen: Nasopharyngeal Swab  Result Value Ref Range Status   SARS Coronavirus 2 NEGATIVE NEGATIVE Final    Comment: (NOTE) SARS-CoV-2 target nucleic acids are NOT DETECTED.  The SARS-CoV-2 RNA is generally detectable in upper and lower respiratory specimens during the acute phase of infection. The lowest concentration of SARS-CoV-2 viral copies this assay can detect is  250 copies / mL. A negative result does not preclude SARS-CoV-2 infection and should not be used as the sole basis for treatment or other patient management decisions.  A negative result may occur with improper specimen collection / handling, submission of specimen other than nasopharyngeal swab, presence of viral mutation(s) within the areas targeted by this assay, and inadequate number of viral copies (<250 copies / mL). A negative result must be combined with clinical observations, patient history, and epidemiological information.  Fact Sheet for Patients:   BoilerBrush.com.cy  Fact Sheet for Healthcare Providers: https://pope.com/  This test is not yet approved or  cleared by the [place] FDA and has been authorized for detection and/or diagnosis of SARS-CoV-2 by FDA under an Emergency Use Authorization (EUA).  This EUA will remain in effect (meaning this test can be used) for the duration of the COVID-19 declaration under Section 564(b)(1) of the Act, 21 U.S.C. section  360bbb-3(b)(1), unless the authorization is terminated or revoked sooner.  Performed at [facility], [address] [place], [place] [address]      Time coordinating discharge: 45 minutes  SIGNED:   Coralie Keens, MD  [facility] 01/28/2020, 9:13 AM

## 2020-01-28 NOTE — TOC Transition Note (Signed)
Transition of Care [facility]) - CM/SW Discharge Note   Patient Details  Name: Sara Hill MRN: [id number] Date of Birth: May 28, 1991  Transition of Care [facility]) CM/SW Contact:  Ida Rogue, LCSW Phone Number: 01/28/2020, 10:13 AM   Clinical Narrative:  Called [facility] for help with Creon.  They were unable to help.  However, Zenpep is a substitute that they can provide on-going for patient through PAP program.  Dr Jeneen Montgomery it and sent script to pharmacy.  Let patient know to pick up meds today and fill out PAP paperwork.  TOC sign off.     Final next level of care: Home/Self Care Barriers to Discharge: No Barriers Identified   Patient Goals and CMS Choice        Discharge Placement                       Discharge Plan and Services In-house Referral: Clinical Social Work                                   Social Determinants of Health (SDOH) Interventions     Readmission Risk Interventions No flowsheet data found.

## 2020-01-31 ENCOUNTER — Telehealth: Payer: Self-pay

## 2020-01-31 NOTE — Telephone Encounter (Signed)
Transition Care Management Follow-up Telephone Call  Date of discharge and from where: 01/28/2020, [facility]   How have you been since you were released from the hospital? She said she is going okay,  Any questions or concerns?none at this time   Items Reviewed:  Did the pt receive and understand the discharge instructions providedyes, and she did not have any questions.   Medications obtained and verified? She needs to pick up the new medications - loperamide, pancreaze and sodium bicarbonate.  She has the other medications and did not have any questions about her med regime   Any new allergies since your discharge? none reported   Do you have support at home?  yes  Other (ie: DME, Home Health, etc) no home health or DME ordered   Functional Questionnaire: (I = Independent and D = Dependent) ADL's:independent   Follow up appointments reviewed:    PCP Hospital f/u appt confirmed?scheduled appt with Dr Earlene Plater - 02/24/2020 @ 1430.  The patient preferred afternoon appt due to her work schedule  [facility] f/u appt confirmed? .none scheduled at this time   Are transportation arrangements needed?  she thought she would be okay but does have problems with transportation at times  If their condition worsens, is the pt aware to call  their PCP or go to the ED?  yes  Was the patient provided with contact information for the PCP's office or ED?  she has the phone number for [facility]  Was the pt encouraged to call back with questions or concerns?  yes

## 2020-02-24 ENCOUNTER — Encounter: Payer: Self-pay | Admitting: Internal Medicine

## 2020-02-24 ENCOUNTER — Telehealth (INDEPENDENT_AMBULATORY_CARE_PROVIDER_SITE_OTHER): Payer: Self-pay | Admitting: Internal Medicine

## 2020-02-24 DIAGNOSIS — Z5989 Other problems related to housing and economic circumstances: Secondary | ICD-10-CM

## 2020-02-24 DIAGNOSIS — Z09 Encounter for follow-up examination after completed treatment for conditions other than malignant neoplasm: Secondary | ICD-10-CM

## 2020-02-24 DIAGNOSIS — R1084 Generalized abdominal pain: Secondary | ICD-10-CM

## 2020-02-24 DIAGNOSIS — K529 Noninfective gastroenteritis and colitis, unspecified: Secondary | ICD-10-CM

## 2020-02-24 DIAGNOSIS — Z598 Other problems related to housing and economic circumstances: Secondary | ICD-10-CM

## 2020-02-24 DIAGNOSIS — E876 Hypokalemia: Secondary | ICD-10-CM

## 2020-02-24 NOTE — Progress Notes (Signed)
Virtual Visit via Telephone Note  I connected with Sara Hill, on 02/24/2020 at 2:37 PM by telephone due to the COVID-19 pandemic and verified that I am speaking with the correct person using two identifiers.   Consent: I discussed the limitations, risks, security and privacy concerns of performing an evaluation and management service by telephone and the availability of in person appointments. I also discussed with the patient that there may be a patient responsible charge related to this service. The patient expressed understanding and agreed to proceed.   Location of Patient: Home   Location of Provider: Clinic    Persons participating in Telemedicine visit: Sara Hill [facility] Dr. Earlene Plater      History of Present Illness: Patient has a visit for hospital follow up. She was hospitalized from 6/16-6/18. She has history of chronic diarrhea, chronic hypokalemia and severe calorie protein malnutrition.   Patient reports that she is feeling okay. She is taking K supplement. She is having diarrhea more than 5x per day. Has been occurring since a few days after she left the hospital. Denies current chest pain and SOB. 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 Constant stomach cramping. Feels jitteriness and like she is going to have to faint.   Has never been seen by GI outpatient. Reports that when she is hospitalized, she is able to recover with IV potassium but once it gets back to normal level and she is started on PO potassium it drops.    Recommendations for Outpatient Follow-up and new medication changes:  1. Follow up with Sara Courts FNP in 7 days.  2. Continue Kcl supplementation 3. Added sodium bicarbonate tid for non anion gap metabolic acidosis. 4. Follow up renal panel in 7 days.    1.  Hypokalemia, hypomagnesemia and non-anion gap metabolic acidosis.  Electrolyte disbalance  due to gastrointestinal losses.  She was admitted to the medical ward with remote telemetry monitoring.  Patient received intravenous fluids along with potassium and magnesium supplements.  Patient continued to have intermittent diarrhea.  Stable p.o. intake, with no nausea or vomiting.  Her discharge sodium is 140, potassium 3.2, chloride 113, bicarb 20, BUN less than 5 and creatinine 0.39.  Patient will received 80 mEq of potassium chloride in 2 divided doses before discharge.  Patient will continue taking sodium bicarb 3 times daily.  Recommend follow-up renal panel and electrolytes within 7 days.  At discharge she will continue taking K and Mg supplements.   2.  Chronic diarrhea with severe calorie protein malnutrition, present on admission.  Her diarrhea has stabilized, continue to be intermittent.  Patient taken Bentyl at home, added loperamide as needed with good toleration.  Patient will continue taking protein supplements.  Patient will continue taking Creon.  She does have difficulty accessing his medication due to elevated cost.  3.  Chronic abdominal pain.  Her abdominal pain improved during her hospitalization, patient will follow-up as an outpatient.    Past Medical History:  Diagnosis Date  . Gall stones   . Low blood potassium    Allergies  Allergen Reactions  . Benadryl [Diphenhydramine Hcl] Itching and Swelling    Pt reeports "feels like throat swelling".     Current Outpatient Medications on File Prior to Visit  Medication Sig Dispense Refill  . dicyclomine (BENTYL) 20 MG tablet Take 1 tablet (20 mg total) by mouth 3 (three) times daily before meals. 90 tablet 6  . loperamide (  IMODIUM) 2 MG capsule Take 1 capsule (2 mg total) by mouth every 4 (four) hours as needed for diarrhea or loose stools. 30 capsule 0  . Multiple Vitamin (MULTIVITAMIN) TABS Take 1 tablet by mouth daily. 100 tablet 3  . Pancrelipase, Lip-Prot-Amyl, (PANCREAZE) 21000-54700 units CPEP Take  21,000 Units by mouth with breakfast, with lunch, and with evening meal. 270 capsule 0  . potassium chloride SA (KLOR-CON) 20 MEQ tablet Take 2 tabs PO Q a.m and 1 tab PO Q p.m (Patient taking differently: Take 20-40 mEq by mouth See admin instructions. Take 40mg   PO Q a.m and 20mg  PO Q p.m) 90 tablet 6   No current facility-administered medications on file prior to visit.    Observations/Objective: NAD. Speaking clearly.  Work of breathing normal.  Alert and oriented. Mood appropriate.   Assessment and Plan: 1. Hospital discharge follow-up  2. Chronic diarrhea 3. Generalized abdominal pain 4. Acute hypokalemia She has history of chronic diarrhea, chronic hypokalemia and severe calorie protein malnutrition. Patient with persistent severe diarrhea since discharge. I am concerned based on her symptoms for recurrent electrolyte disturbances due to GI losses. Given risk of severe hypokalemia causing cardiac arrhythmia, I have advised that she be evaluated in the ER again. Given sending to ED, will defer having patient come in for laboratory monitoring. Discussed that these symptoms and pattern of admissions will likely continue to recur unless we can determine etiology and have a better outpatient regimen. Have placed referral to GI.  - Ambulatory referral to Gastroenterology  5. Does not have health insurance Will mail financial assistance paperwork. Emphasized importance of this so that she is able to receive assistance with medications, testing, specialists, etc.    Follow Up Instructions: Patient to go to the ER    I discussed the assessment and treatment plan with the patient. The patient was provided an opportunity to ask questions and all were answered. The patient agreed with the plan and demonstrated an understanding of the instructions.   The patient was advised to call back or seek an in-person evaluation if the symptoms worsen or if the condition fails to improve as  anticipated.     I provided 20 minutes total of non-face-to-face time during this encounter including median intraservice time, reviewing previous notes, investigations, ordering medications, medical decision making, coordinating care and patient verbalized understanding at the end of the visit.    , D.O. [facility]  02/24/2020, 2:37 PM

## 2020-02-28 ENCOUNTER — Other Ambulatory Visit: Payer: Self-pay

## 2020-02-28 ENCOUNTER — Encounter (HOSPITAL_COMMUNITY): Payer: Self-pay | Admitting: Emergency Medicine

## 2020-02-28 ENCOUNTER — Emergency Department (HOSPITAL_COMMUNITY): Payer: Self-pay

## 2020-02-28 ENCOUNTER — Inpatient Hospital Stay (HOSPITAL_COMMUNITY)
Admission: EM | Admit: 2020-02-28 | Discharge: 2020-02-29 | DRG: 640 | Discharge status: Against Medical Advice | Payer: Self-pay | Source: Ambulatory Visit | Attending: Internal Medicine | Admitting: Internal Medicine

## 2020-02-28 DIAGNOSIS — K8020 Calculus of gallbladder without cholecystitis without obstruction: Secondary | ICD-10-CM | POA: Diagnosis present

## 2020-02-28 DIAGNOSIS — Z20822 Contact with and (suspected) exposure to covid-19: Secondary | ICD-10-CM | POA: Diagnosis present

## 2020-02-28 DIAGNOSIS — Z9119 Patient's noncompliance with other medical treatment and regimen: Secondary | ICD-10-CM

## 2020-02-28 DIAGNOSIS — N200 Calculus of kidney: Secondary | ICD-10-CM | POA: Diagnosis present

## 2020-02-28 DIAGNOSIS — Z681 Body mass index (BMI) 19 or less, adult: Secondary | ICD-10-CM

## 2020-02-28 DIAGNOSIS — Z79899 Other long term (current) drug therapy: Secondary | ICD-10-CM

## 2020-02-28 DIAGNOSIS — Z833 Family history of diabetes mellitus: Secondary | ICD-10-CM

## 2020-02-28 DIAGNOSIS — Z9049 Acquired absence of other specified parts of digestive tract: Secondary | ICD-10-CM

## 2020-02-28 DIAGNOSIS — E876 Hypokalemia: Principal | ICD-10-CM | POA: Diagnosis present

## 2020-02-28 DIAGNOSIS — K529 Noninfective gastroenteritis and colitis, unspecified: Secondary | ICD-10-CM | POA: Diagnosis present

## 2020-02-28 DIAGNOSIS — Z5329 Procedure and treatment not carried out because of patient's decision for other reasons: Secondary | ICD-10-CM | POA: Diagnosis present

## 2020-02-28 DIAGNOSIS — E43 Unspecified severe protein-calorie malnutrition: Secondary | ICD-10-CM | POA: Diagnosis present

## 2020-02-28 LAB — CBC WITH DIFFERENTIAL/PLATELET
Abs Immature Granulocytes: 0.03 10*3/uL (ref 0.00–0.07)
Basophils Absolute: 0.1 10*3/uL (ref 0.0–0.1)
Basophils Relative: 1 %
Eosinophils Absolute: 0 10*3/uL (ref 0.0–0.5)
Eosinophils Relative: 0 %
HCT: 37.9 % (ref 36.0–46.0)
Hemoglobin: 13.4 g/dL (ref 12.0–15.0)
Immature Granulocytes: 0 %
Lymphocytes Relative: 19 %
Lymphs Abs: 1.9 10*3/uL (ref 0.7–4.0)
MCH: 29.7 pg (ref 26.0–34.0)
MCHC: 35.4 g/dL (ref 30.0–36.0)
MCV: 84 fL (ref 80.0–100.0)
Monocytes Absolute: 0.6 10*3/uL (ref 0.1–1.0)
Monocytes Relative: 6 %
Neutro Abs: 7.4 10*3/uL (ref 1.7–7.7)
Neutrophils Relative %: 74 %
Platelets: 262 10*3/uL (ref 150–400)
RBC: 4.51 MIL/uL (ref 3.87–5.11)
RDW: 14.6 % (ref 11.5–15.5)
WBC: 9.9 10*3/uL (ref 4.0–10.5)
nRBC: 0 % (ref 0.0–0.2)

## 2020-02-28 LAB — BASIC METABOLIC PANEL
Anion gap: 6 (ref 5–15)
BUN: <5 mg/dL — ABNORMAL LOW (ref 6–20)
CO2: 21 mmol/L — ABNORMAL LOW (ref 22–32)
Calcium: 8.3 mg/dL — ABNORMAL LOW (ref 8.9–10.3)
Chloride: 113 mmol/L — ABNORMAL HIGH (ref 98–111)
Creatinine, Ser: 0.43 mg/dL — ABNORMAL LOW (ref 0.44–1.00)
GFR calc Af Amer: >60 mL/min (ref >60)
GFR calc non Af Amer: >60 mL/min (ref >60)
Glucose, Bld: 131 mg/dL — ABNORMAL HIGH (ref 70–99)
Potassium: 2.5 mmol/L — CL (ref 3.5–5.1)
Sodium: 140 mmol/L (ref 135–145)

## 2020-02-28 LAB — COMPREHENSIVE METABOLIC PANEL
ALT: 24 U/L (ref 0–44)
AST: 22 U/L (ref 15–41)
Albumin: 4.1 g/dL (ref 3.5–5.0)
Alkaline Phosphatase: 66 U/L (ref 38–126)
Anion gap: 12 (ref 5–15)
BUN: <5 mg/dL — ABNORMAL LOW (ref 6–20)
CO2: 21 mmol/L — ABNORMAL LOW (ref 22–32)
Calcium: 9 mg/dL (ref 8.9–10.3)
Chloride: 107 mmol/L (ref 98–111)
Creatinine, Ser: 0.48 mg/dL (ref 0.44–1.00)
GFR calc Af Amer: >60 mL/min (ref >60)
GFR calc non Af Amer: >60 mL/min (ref >60)
Glucose, Bld: 112 mg/dL — ABNORMAL HIGH (ref 70–99)
Potassium: <2 mmol/L — CL (ref 3.5–5.1)
Sodium: 140 mmol/L (ref 135–145)
Total Bilirubin: 1.5 mg/dL — ABNORMAL HIGH (ref 0.3–1.2)
Total Protein: 7.6 g/dL (ref 6.5–8.1)

## 2020-02-28 LAB — URINALYSIS, ROUTINE W REFLEX MICROSCOPIC
Bilirubin Urine: NEGATIVE
Glucose, UA: NEGATIVE mg/dL
Hgb urine dipstick: NEGATIVE
Ketones, ur: NEGATIVE mg/dL
Leukocytes,Ua: NEGATIVE
Nitrite: NEGATIVE
Protein, ur: NEGATIVE mg/dL
Specific Gravity, Urine: 1.003 — ABNORMAL LOW (ref 1.005–1.030)
pH: 8 (ref 5.0–8.0)

## 2020-02-28 LAB — I-STAT BETA HCG BLOOD, ED (MC, WL, AP ONLY): I-stat hCG, quantitative: <5 m[IU]/mL (ref <5)

## 2020-02-28 LAB — CREATININE, URINE, RANDOM: Creatinine, Urine: 15.91 mg/dL

## 2020-02-28 LAB — NA AND K (SODIUM & POTASSIUM), RAND UR
Potassium Urine: 11 mmol/L
Sodium, Ur: 56 mmol/L

## 2020-02-28 LAB — HIV ANTIBODY (ROUTINE TESTING W REFLEX): HIV Screen 4th Generation wRfx: NONREACTIVE

## 2020-02-28 LAB — SARS CORONAVIRUS 2 BY RT PCR (HOSPITAL ORDER, PERFORMED IN ~~LOC~~ HOSPITAL LAB): SARS Coronavirus 2: NEGATIVE

## 2020-02-28 LAB — MAGNESIUM: Magnesium: 1.9 mg/dL (ref 1.7–2.4)

## 2020-02-28 LAB — TROPONIN I (HIGH SENSITIVITY)
Troponin I (High Sensitivity): 4 ng/L (ref <18)
Troponin I (High Sensitivity): 5 ng/L (ref <18)

## 2020-02-28 MED ORDER — POTASSIUM CHLORIDE 20 MEQ PO PACK
40.0000 meq | PACK | ORAL | Status: DC
  Filled 2020-02-28 (×3): qty 2

## 2020-02-28 MED ORDER — MAGNESIUM SULFATE 2 GM/50ML IV SOLN
2.0000 g | Freq: Once | INTRAVENOUS | Status: AC
  Administered 2020-02-28: 2 g via INTRAVENOUS
  Filled 2020-02-28: qty 50

## 2020-02-28 MED ORDER — MAGNESIUM SULFATE 2 GM/50ML IV SOLN: 2.0000 g | Freq: Once | INTRAVENOUS | Status: DC

## 2020-02-28 MED ORDER — SODIUM CHLORIDE 0.9 % IV BOLUS
1000.0000 mL | Freq: Once | INTRAVENOUS | Status: AC
  Administered 2020-02-28: 1000 mL via INTRAVENOUS

## 2020-02-28 MED ORDER — DICYCLOMINE HCL 20 MG PO TABS
20.0000 mg | ORAL_TABLET | Freq: Three times a day (TID) | ORAL | Status: DC
  Administered 2020-02-29: 20 mg via ORAL
  Filled 2020-02-28: qty 1

## 2020-02-28 MED ORDER — CHOLESTYRAMINE 4 G PO PACK
2.0000 g | PACK | Freq: Every day | ORAL | Status: DC
  Filled 2020-02-28 (×2): qty 1

## 2020-02-28 MED ORDER — MORPHINE SULFATE (PF) 4 MG/ML IV SOLN
4.0000 mg | Freq: Once | INTRAVENOUS | Status: AC
  Administered 2020-02-28: 4 mg via INTRAVENOUS
  Filled 2020-02-28: qty 1

## 2020-02-28 MED ORDER — POTASSIUM CHLORIDE CRYS ER 20 MEQ PO TBCR
40.0000 meq | EXTENDED_RELEASE_TABLET | ORAL | Status: AC
  Administered 2020-02-28 (×2): 40 meq via ORAL
  Filled 2020-02-28 (×2): qty 2

## 2020-02-28 MED ORDER — HYDROMORPHONE HCL 1 MG/ML IJ SOLN
0.5000 mg | INTRAMUSCULAR | Status: DC | PRN
  Administered 2020-02-28 – 2020-02-29 (×2): 0.5 mg via INTRAVENOUS
  Filled 2020-02-28 (×2): qty 1

## 2020-02-28 MED ORDER — CALCIUM CITRATE 950 (200 CA) MG PO TABS
200.0000 mg | ORAL_TABLET | Freq: Every day | ORAL | Status: DC
  Administered 2020-02-28: 200 mg via ORAL
  Filled 2020-02-28 (×2): qty 1

## 2020-02-28 MED ORDER — POTASSIUM CHLORIDE CRYS ER 20 MEQ PO TBCR
40.0000 meq | EXTENDED_RELEASE_TABLET | Freq: Once | ORAL | Status: AC
  Administered 2020-02-28: 40 meq via ORAL
  Filled 2020-02-28: qty 2

## 2020-02-28 MED ORDER — ADULT MULTIVITAMIN W/MINERALS CH
1.0000 | ORAL_TABLET | Freq: Every day | ORAL | Status: DC
  Administered 2020-02-28: 1 via ORAL
  Filled 2020-02-28: qty 1

## 2020-02-28 MED ORDER — BACID PO TABS
2.0000 | ORAL_TABLET | Freq: Three times a day (TID) | ORAL | Status: DC
  Administered 2020-02-28 – 2020-02-29 (×2): 2 via ORAL
  Filled 2020-02-28 (×3): qty 2

## 2020-02-28 MED ORDER — ONDANSETRON HCL 4 MG/2ML IJ SOLN
2.0000 mg | Freq: Once | INTRAMUSCULAR | Status: AC
  Administered 2020-02-28: 2 mg via INTRAVENOUS
  Filled 2020-02-28: qty 2

## 2020-02-28 MED ORDER — OXYCODONE HCL 5 MG PO TABS
5.0000 mg | ORAL_TABLET | ORAL | Status: DC | PRN
  Administered 2020-02-29: 5 mg via ORAL
  Filled 2020-02-28: qty 1

## 2020-02-28 MED ORDER — PANCRELIPASE (LIP-PROT-AMYL) 12000-38000 UNITS PO CPEP
12000.0000 [IU] | ORAL_CAPSULE | Freq: Three times a day (TID) | ORAL | Status: DC
  Administered 2020-02-29: 12000 [IU] via ORAL
  Filled 2020-02-28 (×2): qty 1

## 2020-02-28 MED ORDER — LOPERAMIDE HCL 2 MG PO CAPS: 2.0000 mg | ORAL_CAPSULE | ORAL | Status: DC | PRN

## 2020-02-28 MED ORDER — POTASSIUM CHLORIDE 10 MEQ/100ML IV SOLN
10.0000 meq | Freq: Once | INTRAVENOUS | Status: AC
  Administered 2020-02-28: 10 meq via INTRAVENOUS
  Filled 2020-02-28: qty 100

## 2020-02-28 NOTE — ED Triage Notes (Signed)
Pt states she was sent by PCP for low potassium.

## 2020-02-28 NOTE — ED Notes (Signed)
Spoke with Shalhoub MD, awaiting order for PO potassium for pt

## 2020-02-28 NOTE — ED Notes (Signed)
MD messaged concerning pt receiving 1 run K and , pt MD would like pt to receive 40 additional mEq K PO.

## 2020-02-28 NOTE — ED Provider Notes (Cosign Needed Addendum)
[facility] EMERGENCY DEPARTMENT Provider Note   CSN: [id number] Arrival date & time: 02/28/20  1410     History Chief Complaint  Patient presents with  . Abnormal Lab    Sara Hill is a 29 y.o. female history of chronic malnutrition and chronic diarrhea, hypokalemia, cholecystectomy.  Patient sent in by PCP today for concern of hypokalemia. Patient reports chronic diarrhea and abdominal pain for several months, no acute changes. She reports multiple episodes of non-bloody diarrhea a day. Associated with a mild cramping lower abdominal pain which is only present when she has diarrhea, pain resolves with resolution of diarrhea, pain does not radiate. Associated symptom of cramping myalgias, patient reports that she gets myalgias whenever her potassium level is low.  She has been having cramps for the past several days.  Denies fever/chills, cp/sob, cough/hemoptysis, vomiting, fall/injury, dysuria/hematuria, vaginal bleeding/discharge or any additional concerns.  HPI     Past Medical History:  Diagnosis Date  . Gall stones   . Low blood potassium     Patient Active Problem List   Diagnosis Date Noted  . Chronic malnutrition (HCC) 06/04/2019  . Chronic diarrhea   . Does not have health insurance   . Acute hypokalemia 01/26/2019  . Dehydration 11/01/2018  . Abdominal pain 11/01/2018  . Generalized weakness 11/01/2018  . Nausea and vomiting   . Protein-calorie malnutrition, severe 06/29/2018  . Hypokalemia 06/27/2018    Past Surgical History:  Procedure Laterality Date  . CHOLECYSTECTOMY    . COLONOSCOPY  01/2018   normal  . ESOPHAGOGASTRODUODENOSCOPY  01/2018   normal     OB History   No obstetric history on file.     Family History  Problem Relation Age of Onset  . Healthy Mother   . Diabetes Mother     Social History   Tobacco Use  . Smoking status: Never Smoker  . Smokeless tobacco: Never Used  Vaping Use  . Vaping Use:  Never used  Substance Use Topics  . Alcohol use: No  . Drug use: Yes    Types: Marijuana    Home Medications Prior to Admission medications   Medication Sig Start Date End Date Taking? Authorizing Provider  dicyclomine (BENTYL) 20 MG tablet Take 1 tablet (20 mg total) by mouth 3 (three) times daily before meals. 07/06/19   Marcine Matar, MD  loperamide (IMODIUM) 2 MG capsule Take 1 capsule (2 mg total) by mouth every 4 (four) hours as needed for diarrhea or loose stools. 01/28/20   Arrien, York Ram, MD  Multiple Vitamin (MULTIVITAMIN) TABS Take 1 tablet by mouth daily. 07/06/19   Marcine Matar, MD  potassium chloride SA (KLOR-CON) 20 MEQ tablet Take 2 tabs PO Q a.m and 1 tab PO Q p.m Patient taking differently: Take 20-40 mEq by mouth See admin instructions. Take 40mg   PO Q a.m and 20mg  PO Q p.m 07/06/19   , MD    Allergies    Benadryl [diphenhydramine hcl]  Review of Systems   Review of Systems Ten systems are reviewed and are negative for acute change except as noted in the HPI  Physical Exam Updated Vital Signs BP (!) 108/46 (BP Location: Right Arm)   Pulse 89   Temp 98.3 F (36.8 C) (Oral)   Resp 15   Ht 5' (1.524 m)   Wt 36.3 kg   SpO2 100%   BMI 15.62 kg/m   Physical Exam Constitutional:      General:  She is not in acute distress.    Appearance: Normal appearance. She is well-developed and underweight. She is not toxic-appearing or diaphoretic.  HENT:     Head: Normocephalic and atraumatic.  Eyes:     General: Vision grossly intact. Gaze aligned appropriately.     Pupils: Pupils are equal, round, and reactive to light.  Neck:     Trachea: Trachea and phonation normal.  Pulmonary:     Effort: Pulmonary effort is normal. No respiratory distress.  Abdominal:     General: There is no distension.     Palpations: Abdomen is soft.     Tenderness: There is no abdominal tenderness. There is no guarding or rebound.  Musculoskeletal:         General: Normal range of motion.     Cervical back: Normal range of motion.  Skin:    General: Skin is warm and dry.  Neurological:     Mental Status: She is alert.     GCS: GCS eye subscore is 4. GCS verbal subscore is 5. GCS motor subscore is 6.     Comments: Speech is clear and goal oriented, follows commands Major Cranial nerves without deficit, no facial droop Moves extremities without ataxia, coordination intact  Psychiatric:        Behavior: Behavior normal.     ED Results / Procedures / Treatments   Labs (all labs ordered are listed, but only abnormal results are displayed) Labs Reviewed  COMPREHENSIVE METABOLIC PANEL - Abnormal; Notable for the following components:      Result Value   Potassium <2.0 (*)    CO2 21 (*)    Glucose, Bld 112 (*)    BUN <5 (*)    Total Bilirubin 1.5 (*)    All other components within normal limits  SARS CORONAVIRUS 2 BY RT PCR (HOSPITAL ORDER, PERFORMED IN [facility])  CBC WITH DIFFERENTIAL/PLATELET  MAGNESIUM  URINALYSIS, ROUTINE W REFLEX MICROSCOPIC  I-STAT BETA HCG BLOOD, ED ([facility], [facility], [facility] ONLY)  TROPONIN I (HIGH SENSITIVITY)    EKG EKG Interpretation  Date/Time:  Monday February 28 2020 16:03:11 EDT Ventricular Rate:  77 PR Interval:    QRS Duration: 108 QT Interval:  411 QTC Calculation: 466 R Axis:   92 Text Interpretation: Sinus rhythm Consider left atrial enlargement Consider RVH w/ secondary repol abnormality No significant change since last tracing Confirmed by Linwood DibblesKnapp, Jon [phone]) on 02/28/2020 4:06:30 PM   Radiology No results found.  Procedures .Critical Care Performed by: Bill SalinasMorelli, Patrina Andreas A, PA-C Authorized by: Bill SalinasMorelli, Reveca Desmarais A, PA-C   Critical care provider statement:    Critical care time (minutes):  41   Critical care was necessary to treat or prevent imminent or life-threatening deterioration of the following conditions:  Metabolic crisis (Hypokalemia)   Critical care was time spent  personally by me on the following activities:  Discussions with consultants, evaluation of patient's response to treatment, examination of patient, ordering and performing treatments and interventions, ordering and review of laboratory studies, ordering and review of radiographic studies, pulse oximetry, re-evaluation of patient's condition, obtaining history from patient or surrogate, review of old charts and development of treatment plan with patient or surrogate   (including critical care time)  Medications Ordered in ED Medications  potassium chloride 10 mEq in 100 mL IVPB (10 mEq Intravenous New Bag/Given 02/28/20 1603)  morphine 4 MG/ML injection 4 mg (has no administration in time range)  ondansetron (ZOFRAN) injection 2 mg (has no administration in  time range)  potassium chloride SA (KLOR-CON) CR tablet 40 mEq (40 mEq Oral Given 02/28/20 1553)  sodium chloride 0.9 % bolus 1,000 mL (1,000 mLs Intravenous New Bag/Given 02/28/20 1604)    ED Course  I have reviewed the triage vital signs and the nursing notes.  Pertinent labs & imaging results that were available during my care of the patient were reviewed by me and considered in my medical decision making (see chart for details).  Clinical Course as of Feb 28 1715  Mon Feb 28, 2020  1525 Patient not in room   [BM]  1530 Patient not in room   [BM]  1540 Patient not in room   [BM]  1490 29 year old female with chronic diarrhea here with muscle cramps fatigue found to have potassium less than 2.  She has been admitted for this before.  Said she is having 8 loose bowel movements a day.  Will need to be admitted for potassium repletion and further work-up of her metabolic derangement.   [MB]    Clinical Course User Index [BM] Bill Salinas, PA-C [MB] Terrilee Files, MD   MDM Rules/Calculators/A&P                          Additional History Obtained from: 1. Nursing notes from this visit. 2. Electronic medical record  reviewed; patient with multiple prior admissions for hypokalemia, most recently on January 26, 2020.  Reviewed discharge summary.  Primary diagnosis hypokalemia, hypomagnesemia and nonanion gap metabolic acidosis due to gastrointestinal losses, she was admitted to the medical ward and received fluids and potassium/magnesium supplements.  She was discharged with potassium and magnesium supplements.  Patient is to be taking Bentyl and loperamide for her chronic diarrhea.  ------------------------------ I ordered, reviewed and interpreted labs which include: CBC shows no leukocytosis to suggest infection and no anemia. Pregnancy test is negative. CMP shows potassium less than 2, no other emergent electrolyte derangement, no AKI, no gap, LFTs not emergently elevated, mild bilirubin of 1.5 similar to prior. Covid test pending Magnesium level pending Troponin level pending Urinalysis pending  CXR:    IMPRESSION:  No acute cardiopulmonary abnormality.  I have personally reviewed patient's chest xray and agree with radiologist interpretation above.  EKG: Sinus rhythm Consider left atrial enlargement Consider RVH w/ secondary repol abnormality No significant change since last tracing Confirmed by Linwood Dibbles [phone]) on 02/28/2020 4:06:30 PM - Potassium supplementation begun in the ER, additionally given 1 L fluid bolus.  Patient endorsing crampy abdominal pain, morphine and Zofran were ordered.  Patient's generalized body cramping sensation suspect secondary to hypokalemia today, she reports this is consistent with prior episodes of hypokalemia and without abnormal feature but since the cramping sensation did include chest so troponin level was added.  EKG shows no acute ischemic changes doubt ACS, PE, dissection at this time.  Patient reassessed resting comfortably no acute distress states understanding of care plan and agreeable for admission. - 4:44 PM: Discussed case with Dr. Chipper Herb, patient accepted to  hospitalist service.  Patient seen and evaluated by Dr. Charm Barges during this visit.  Note: Portions of this report may have been transcribed using voice recognition software. Every effort was made to ensure accuracy; however, inadvertent computerized transcription errors may still be present. Final Clinical Impression(s) / ED Diagnoses Final diagnoses:  Hypokalemia    Rx / DC Orders ED Discharge Orders    None       Bill Salinas,  PA-C 02/28/20 1740    Bill Salinas, PA-C 02/28/20 1741

## 2020-02-28 NOTE — H&P (Signed)
History and Physical    Sara Hill UMP:[id number] DOB: [birth year] DOA: 02/28/2020  PCP: Bing Neighbors, FNP (Confirm with patient/family/NH records and if not entered, this has to be entered at [facility] point of entry) Patient coming from: Home  I have personally briefly reviewed patient's old medical records in [facility]  Chief Complaint: Feeling weak, diarrhea, abd pain  HPI: Sara Hill is a 29 y.o. female with medical history significant of chronic malabsorption, abd pain and diarrhea since cholecystectomy in 2015.  According to her fianc patient passed out while she was a passenger in the car.  She is not taking her prescribed potassium supplements for few weeks, and continues to have diarrhea 6-8 times per day watery.  Accompanied with cramping like abdominal pain, no fever chills no nausea vomiting. Denies urinary complaints.  Denies cough shortness of breath chest pain.  Denies headache changes with her vision.  She had a hospital admission in January 2021 for hypokalemia and generalized weakness.  ED Course: Patient received IV potassium.  Blood pressure 108/46 pulse is 78 respiration 25 temperature 98. Sodium 140 potassium <2.0 BUN 5 creatinine 0.38 magnesium 1.9.   Review of Systems: As per HPI otherwise 10 point review of systems negative.    Past Medical History:  Diagnosis Date  . Gall stones   . Low blood potassium     Past Surgical History:  Procedure Laterality Date  . CHOLECYSTECTOMY    . COLONOSCOPY  01/2018   normal  . ESOPHAGOGASTRODUODENOSCOPY  01/2018   normal     reports that she has never smoked. She has never used smokeless tobacco. She reports current drug use. Drug: Marijuana. She reports that she does not drink alcohol.  Allergies  Allergen Reactions  . Benadryl [Diphenhydramine Hcl] Itching and Swelling    Pt reeports "feels like throat swelling".     Family History  Problem Relation Age of Onset  . Healthy  Mother   . Diabetes Mother      Prior to Admission medications   Medication Sig Start Date End Date Taking? Authorizing Provider  dicyclomine (BENTYL) 20 MG tablet Take 1 tablet (20 mg total) by mouth 3 (three) times daily before meals. 07/06/19   Marcine Matar, MD  loperamide (IMODIUM) 2 MG capsule Take 1 capsule (2 mg total) by mouth every 4 (four) hours as needed for diarrhea or loose stools. 01/28/20   Arrien, York Ram, MD  Multiple Vitamin (MULTIVITAMIN) TABS Take 1 tablet by mouth daily. 07/06/19   Marcine Matar, MD  potassium chloride SA (KLOR-CON) 20 MEQ tablet Take 2 tabs PO Q a.m and 1 tab PO Q p.m Patient taking differently: Take 20-40 mEq by mouth See admin instructions. Take 40mg   PO Q a.m and 20mg  PO Q p.m 07/06/19   , MD    Physical Exam: Vitals:   02/28/20 1416 02/28/20 1417 02/28/20 1730  BP:  (!) 108/46 116/72  Pulse:  89 78  Resp:  15 (!) 26  Temp:  98.3 F (36.8 C)   TempSrc:  Oral   SpO2:  100% 100%  Weight: 35.5 kg 36.3 kg   Height: 5' (1.524 m) 5' (1.524 m)     Constitutional: NAD, calm, comfortable Vitals:   02/28/20 1416 02/28/20 1417 02/28/20 1730  BP:  (!) 108/46 116/72  Pulse:  89 78  Resp:  15 (!) 26  Temp:  98.3 F (36.8 C)   TempSrc:  Oral   SpO2:  100%  100%  Weight: 35.5 kg 36.3 kg   Height: 5' (1.524 m) 5' (1.524 m)    Eyes: PERRL, lids and conjunctivae normal ENMT: Mucous membranes are moist. Posterior pharynx clear of any exudate or lesions.Normal dentition.  Neck: normal, supple, no masses, no thyromegaly Respiratory: clear to auscultation bilaterally, no wheezing, no crackles. Normal respiratory effort. No accessory muscle use.  Cardiovascular: Regular rate and rhythm, no murmurs / rubs / gallops. No extremity edema. 2+ pedal pulses. No carotid bruits.  Abdomen: mild tenderness periumbilical area no rebound no guarding, no masses palpated. No hepatosplenomegaly. Bowel sounds positive.    Musculoskeletal: no clubbing / cyanosis. No joint deformity upper and lower extremities. Good ROM, no contractures. Normal muscle tone.  Skin: no rashes, lesions, ulcers. No induration Neurologic: CN 2-12 grossly intact. Sensation intact, DTR normal. Strength 5/5 in all 4.  Psychiatric: Normal judgment and insight. Alert and oriented x 3. Normal mood.     Labs on Admission: I have personally reviewed following labs and imaging studies  CBC: Recent Labs  Lab 02/28/20 1420  WBC 9.9  NEUTROABS 7.4  HGB 13.4  HCT 37.9  MCV 84.0  PLT 262   Basic Metabolic Panel: Recent Labs  Lab 02/28/20 1420 02/28/20 1600  NA 140  --   K <2.0*  --   CL 107  --   CO2 21*  --   GLUCOSE 112*  --   BUN <5*  --   CREATININE 0.48  --   CALCIUM 9.0  --   MG  --  1.9   GFR: Estimated Creatinine Clearance: 58.9 mL/min (by C-G formula based on SCr of 0.48 mg/dL). Liver Function Tests: Recent Labs  Lab 02/28/20 1420  AST 22  ALT 24  ALKPHOS 66  BILITOT 1.5*  PROT 7.6  ALBUMIN 4.1   No results for input(s): LIPASE, AMYLASE in the last 168 hours. No results for input(s): AMMONIA in the last 168 hours. Coagulation Profile: No results for input(s): INR, PROTIME in the last 168 hours. Cardiac Enzymes: No results for input(s): CKTOTAL, CKMB, CKMBINDEX, TROPONINI in the last 168 hours. BNP (last 3 results) No results for input(s): PROBNP in the last 8760 hours. HbA1C: No results for input(s): HGBA1C in the last 72 hours. CBG: No results for input(s): GLUCAP in the last 168 hours. Lipid Profile: No results for input(s): CHOL, HDL, LDLCALC, TRIG, CHOLHDL, LDLDIRECT in the last 72 hours. Thyroid Function Tests: No results for input(s): TSH, T4TOTAL, FREET4, T3FREE, THYROIDAB in the last 72 hours. Anemia Panel: No results for input(s): VITAMINB12, FOLATE, FERRITIN, TIBC, IRON, RETICCTPCT in the last 72 hours. Urine analysis:    Component Value Date/Time   COLORURINE STRAW (A) 01/26/2020  0957   APPEARANCEUR CLEAR 01/26/2020 0957   LABSPEC 1.003 (L) 01/26/2020 0957   PHURINE 8.0 01/26/2020 0957   GLUCOSEU NEGATIVE 01/26/2020 0957   HGBUR NEGATIVE 01/26/2020 0957   BILIRUBINUR NEGATIVE 01/26/2020 0957   KETONESUR NEGATIVE 01/26/2020 0957   PROTEINUR NEGATIVE 01/26/2020 0957   UROBILINOGEN 1.0 11/26/2014 1435   NITRITE NEGATIVE 01/26/2020 0957   LEUKOCYTESUR NEGATIVE 01/26/2020 0957    Radiological Exams on Admission: DG Chest Portable 1 View  Result Date: 02/28/2020 CLINICAL DATA:  Myalgias and hypokalemia, nonsmoker EXAM: PORTABLE CHEST 1 VIEW COMPARISON:  Radiograph 09/03/2018 FINDINGS: No consolidation, features of edema, pneumothorax, or effusion. Pulmonary vascularity is normally distributed. The cardiomediastinal contours are unremarkable. No acute osseous or soft tissue abnormality. Telemetry leads overlie the chest. IMPRESSION: No acute cardiopulmonary  abnormality. Electronically Signed   By: Kreg Shropshire M.D.   On: 02/28/2020 17:34    EKG: Independently reviewed.  No acute ST-T changes  Assessment/Plan Active Problems:   Hypokalemia  (please populate well all problems here in Problem List. (For example, if patient is on BP meds at home and you resume or decide to hold them, it is a problem that needs to be her. 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 x3, repeat K level tonight and tomorrow -Although GI looks like a major source of potassium depletion but given the patient also has bilateral kidney stone recent concern about potassium dumping.  Check urine electrolytes may need some potassium preserve medication such as amiloride and consider consult nephro outpatient  Chronic diarrhea -Patient reported the diarrhea started 7 years ago after cholecystectomy, and she followed with a GI at [facility] and had work-up including EGD and colonoscopy, and celiac disease and  microscopic lightest were ruled out in the past. -One possible differential is diarrhea associated with Bile acid, for which will start trial of cholestyramine -No clear history of pancreatitis, lipase normal, sent a stool fat to rule out chronic pancreatic insufficiency and start trial of supplemental lipase. -Other etiologies such as bacterial overgrowth will need to rule out as outpatient.  Patient on probiotic for now  DVT prophylaxis: SCD Code Status: Full code Family Communication: None at bedside Disposition Plan: Severe hypokalemia, likely need more than 2 midnight hospital to replenish Consults called: None Admission status: Tele   Emeline General MD [facility] Pager [phone]  02/28/2020, 5:39 PM

## 2020-02-28 NOTE — ED Notes (Signed)
Admitting MD paged concerning K 2.75mmol/L at last draw.

## 2020-02-29 DIAGNOSIS — E876 Hypokalemia: Principal | ICD-10-CM

## 2020-02-29 LAB — BASIC METABOLIC PANEL
Anion gap: 6 (ref 5–15)
BUN: <5 mg/dL — ABNORMAL LOW (ref 6–20)
CO2: 21 mmol/L — ABNORMAL LOW (ref 22–32)
Calcium: 8.7 mg/dL — ABNORMAL LOW (ref 8.9–10.3)
Chloride: 111 mmol/L (ref 98–111)
Creatinine, Ser: 0.47 mg/dL (ref 0.44–1.00)
GFR calc Af Amer: >60 mL/min (ref >60)
GFR calc non Af Amer: >60 mL/min (ref >60)
Glucose, Bld: 82 mg/dL (ref 70–99)
Potassium: 3.1 mmol/L — ABNORMAL LOW (ref 3.5–5.1)
Sodium: 138 mmol/L (ref 135–145)

## 2020-02-29 LAB — MAGNESIUM: Magnesium: 2 mg/dL (ref 1.7–2.4)

## 2020-02-29 MED ORDER — ENSURE ENLIVE PO LIQD
237.0000 mL | Freq: Three times a day (TID) | ORAL | Status: DC
  Administered 2020-02-29: 237 mL via ORAL
  Filled 2020-02-29: qty 237

## 2020-02-29 MED ORDER — POTASSIUM CHLORIDE CRYS ER 20 MEQ PO TBCR
40.0000 meq | EXTENDED_RELEASE_TABLET | ORAL | Status: AC
  Administered 2020-02-29 (×3): 40 meq via ORAL
  Filled 2020-02-29 (×3): qty 2

## 2020-02-29 MED ORDER — ENSURE MAX PROTEIN PO LIQD
11.0000 [oz_av] | Freq: Three times a day (TID) | ORAL | Status: DC
  Filled 2020-02-29: qty 330

## 2020-02-29 MED ORDER — ONDANSETRON HCL 4 MG/2ML IJ SOLN
4.0000 mg | Freq: Four times a day (QID) | INTRAMUSCULAR | Status: DC | PRN
  Administered 2020-02-29: 4 mg via INTRAVENOUS
  Filled 2020-02-29: qty 2

## 2020-02-29 NOTE — ED Notes (Signed)
Patient stated she could not eat anything and wanted an Ensure. MD made aware and new order for Ensure given. Pharmacy made aware to send Strawberry Ensure.

## 2020-02-29 NOTE — ED Notes (Signed)
Pt calm and cooperative at this time, speaking with provider Shalhoub MD. Pt agrees to stay.

## 2020-02-29 NOTE — ED Notes (Signed)
Patient called out and stated she wanted to leave because she did not know what was going on and felt that she was forgotten. Patient was made aware of what was going on and the doctor would round shortly. Patient agreed to stay.

## 2020-02-29 NOTE — Discharge Summary (Signed)
Discharge AGAINST MEDICAL ADVICE  Sara Hill DUK:[id number] DOB: 31-Dec-1990 DOA: 02/28/2020  PCP: Bing Neighbors, FNP  Admit date: 02/28/2020 Discharge date: 02/29/2020 left AMA  Admitted From: Home   Code Status: Full Code   Discharge Diagnosis:   Active Problems:   Hypokalemia    History of Present Illness / Brief narrative:  Sara Blanchardis a 29 y.o.femalewith medical history significant ofchronic malabsorption, abd pain, diarrhea, chronic hypokalemia since cholecystectomy in 2015.  According to her fianc patient passed out while she was a passenger in the car. She was not taking her prescribed potassium supplementsfor fewweeks, and continues to have watery diarrhea 6-8 times per day.Accompanied with cramping like abdominal pain, no fever, chills no nausea, vomiting.  ED Course:Patient received IV potassium. Blood pressure108/46pulse is 78 respiration 25 temperature 98.Sodium 140 potassium<2.0 BUN 5 creatinine 0.38 magnesium 1.9. She was hospitalized for severe hypokalemia.  Patient was seen and examined this morning. She was frustrated about chronic abdomen pain, diarrhea, chronic hypokalemia. She states he sees a primary care provider every 2 weeks for labs.  Sometimes the needs IV infusion in the office as well.  She had most of her work-up including GI work-up done at [facility].  Underwent EGD and colonoscopy done about 3 years ago.  Reports they were normal. Patient is frustrated that nobody seems to know what is wrong with her. She states he has tried [place] and Imodium in the past without success. I offered her inpatient stay with other antidiarrheal medications and may be a GI consultation. Patient decided to leave AMA.  Hospital Course:  Patient left AGAINST MEDICAL ADVICE.  Please see today's note for details.  Medical Consultants:    None   Discharge Exam:  Not applicable.   Discharge Instructions:    The results of significant diagnostics from this hospitalization (including imaging, microbiology, ancillary and laboratory) are listed below for reference.    Procedures and Diagnostic Studies:   DG Chest Portable 1 View  Result Date: 02/28/2020 CLINICAL DATA:  Myalgias and hypokalemia, nonsmoker EXAM: PORTABLE CHEST 1 VIEW COMPARISON:  Radiograph 09/03/2018 FINDINGS: No consolidation, features of edema, pneumothorax, or effusion. Pulmonary vascularity is normally distributed. The cardiomediastinal contours are unremarkable. No acute osseous or soft tissue abnormality. Telemetry leads overlie the chest. IMPRESSION: No acute cardiopulmonary abnormality. Electronically Signed   By: Kreg Shropshire M.D.   On: 02/28/2020 17:34     Labs:   Basic Metabolic Panel: Recent Labs  Lab 02/28/20 1420 02/28/20 1420 02/28/20 1600 02/28/20 2145 02/29/20 0617  NA 140  --   --  140 138  K <2.0*   < >  --  2.5* 3.1*  CL 107  --   --  113* 111  CO2 21*  --   --  21* 21*  GLUCOSE 112*  --   --  131* 82  BUN <5*  --   --  <5* <5*  CREATININE 0.48  --   --  0.43* 0.47  CALCIUM 9.0  --   --  8.3* 8.7*  MG  --   --  1.9  --  2.0   < > = values in this interval not displayed.   GFR Estimated Creatinine Clearance: 58.9 mL/min (by C-G formula based on SCr of 0.47 mg/dL). Liver Function Tests: Recent Labs  Lab 02/28/20 1420  AST 22  ALT 24  ALKPHOS 66  BILITOT 1.5*  PROT 7.6  ALBUMIN 4.1   No results for input(s): LIPASE, AMYLASE  in the last 168 hours. No results for input(s): AMMONIA in the last 168 hours. Coagulation profile No results for input(s): INR, PROTIME in the last 168 hours.  CBC: Recent Labs  Lab 02/28/20 1420  WBC 9.9  NEUTROABS 7.4  HGB 13.4  HCT 37.9  MCV 84.0  PLT 262   Cardiac Enzymes: No results for input(s): CKTOTAL, CKMB, CKMBINDEX, TROPONINI in the last 168 hours. BNP: Invalid input(s): POCBNP CBG: No results for input(s): GLUCAP in the last 168  hours. D-Dimer No results for input(s): DDIMER in the last 72 hours. Hgb A1c No results for input(s): HGBA1C in the last 72 hours. Lipid Profile No results for input(s): CHOL, HDL, LDLCALC, TRIG, CHOLHDL, LDLDIRECT in the last 72 hours. Thyroid function studies No results for input(s): TSH, T4TOTAL, T3FREE, THYROIDAB in the last 72 hours.  Invalid input(s): FREET3 Anemia work up No results for input(s): VITAMINB12, FOLATE, FERRITIN, TIBC, IRON, RETICCTPCT in the last 72 hours. Microbiology Recent Results (from the past 240 hour(s))  SARS Coronavirus 2 by RT PCR (hospital order, performed in [facility] hospital lab) Nasopharyngeal Nasopharyngeal Swab     Status: None   Collection Time: 02/28/20  3:32 PM   Specimen: Nasopharyngeal Swab  Result Value Ref Range Status   SARS Coronavirus 2 NEGATIVE NEGATIVE Final    Comment: (NOTE) SARS-CoV-2 target nucleic acids are NOT DETECTED.  The SARS-CoV-2 RNA is generally detectable in upper and lower respiratory specimens during the acute phase of infection. The lowest concentration of SARS-CoV-2 viral copies this assay can detect is 250 copies / mL. A negative result does not preclude SARS-CoV-2 infection and should not be used as the sole basis for treatment or other patient management decisions.  A negative result may occur with improper specimen collection / handling, submission of specimen other than nasopharyngeal swab, presence of viral mutation(s) within the areas targeted by this assay, and inadequate number of viral copies (<250 copies / mL). A negative result must be combined with clinical observations, patient history, and epidemiological information.  Fact Sheet for Patients:   BoilerBrush.com.cy  Fact Sheet for Healthcare Providers: https://pope.com/  This test is not yet approved or  cleared by the [place] FDA and has been authorized for detection and/or diagnosis of  SARS-CoV-2 by FDA under an Emergency Use Authorization (EUA).  This EUA will remain in effect (meaning this test can be used) for the duration of the COVID-19 declaration under Section 564(b)(1) of the Act, 21 U.S.C. section 360bbb-3(b)(1), unless the authorization is terminated or revoked sooner.  Performed at [facility], [address], [place], [place] [address]     Signed: Lorin Glass  [facility] 02/29/2020, 10:37 AM

## 2020-02-29 NOTE — ED Notes (Signed)
Pt up to pt restroom with steady gait, pt ambulated self back to treatment room. Pt repositioned self in most comfortable position, continuous cardiac monitoring in place.

## 2020-02-29 NOTE — ED Notes (Signed)
Patient noted walking through hallway with clothes on and when asked patient stated she was leaving and she was not going to wait any longer. Patient was asked if I could take the IV out and she stated "I took it out myself" and kept walking. MD was notified.

## 2020-02-29 NOTE — ED Notes (Signed)
Pt requesting to be discharge, Shalhoub MD made aware.

## 2020-02-29 NOTE — Progress Notes (Signed)
PROGRESS NOTE  Sara Hill  DOB: [birth year]  PCP: Bing Neighbors, FNP OVF:[id number]  DOA: 02/28/2020  LOS: 1 day   Chief Complaint  Patient presents with  . Abnormal Lab   Brief narrative: Sara Hill is a 29 y.o. female with medical history significant of chronic malabsorption, abd pain, diarrhea, chronic hypokalemia since cholecystectomy in 2015.  According to her fianc patient passed out while she was a passenger in the car. She was not taking her prescribed potassium supplements for few weeks, and continues to have watery diarrhea 6-8 times per day.  Accompanied with cramping like abdominal pain, no fever, chills no nausea, vomiting.  ED Course:Patient received IV potassium. Blood pressure 108/46 pulse is 78 respiration 25 temperature 98. Sodium 140 potassium <2.0 BUN 5 creatinine 0.38 magnesium 1.9.  She was hospitalized for severe hypokalemia.  Subjective: Patient was seen and examined this morning. She was frustrated about chronic abdomen pain, diarrhea, chronic hypokalemia. She states he sees a primary care provider every 2 weeks for labs.  Sometimes the needs IV infusion in the office as well.  She had most of her work-up including GI work-up done at [facility].  Underwent EGD and colonoscopy done about 3 years ago.  Reports they were normal. Patient is frustrated that nobody seems to know what is wrong with her. She states he has tried [place] and Imodium in the past without success. I offered her inpatient stay with other antidiarrheal medications and may be a GI consultation. Patient decided to leave AMA.  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  Supposed to be on regular potassium replacement.  Admits to noncompliance.   -Potassium level was less than 2 on admission.  IV and oral replacement given.  Last potassium check this morning showed 3.1.  Further  replacement and recheck planned.  However patient does not want to stay longer. -She says she has enough potassium pills at home to take. -Recommend to continue to follow-up with PCP as an outpatient.  Chronic diarrhea -Patient reported the diarrhea started 7 years ago after cholecystectomy, and she followed with a GI at [facility] and had work-up including EGD and colonoscopy, and celiac disease and microscopic lightest were ruled out in the past. -No clear history of pancreatitis, lipase normal, sent a stool fat to rule out chronic pancreatic insufficiency and start trial of supplemental lipase. -Other etiologies such as bacterial overgrowth will need to rule out as outpatient.  Patient on probiotic for now -She states he has tried Questran and Imodium in the past without success. I offered her inpatient stay with other antidiarrheal medications and may be a GI consultation. Patient decided to leave AMA.  Diet Order            Diet regular Room service appropriate? Yes; Fluid consistency: Thin  Diet effective now                 DVT prophylaxis: SCDs Start: 02/28/20 1735   Status is: Inpatient  Remains inpatient appropriate because: Of severe hypokalemia   Dispo: The patient is from: Home              Anticipated d/c is to: Home              Anticipated d/c date is: 2 days if patient chooses to stay today              Patient currently is not medically stable to d/c.  Infusions:    Scheduled Meds: . calcium citrate  200 mg of elemental calcium Oral Daily  . cholestyramine  2 g Oral Daily  . dicyclomine  20 mg Oral TID AC  . feeding supplement (ENSURE ENLIVE)  237 mL Oral TID BM  . lactobacillus acidophilus  2 tablet Oral TID  . lipase/protease/amylase  12,000 Units Oral TID AC  . multivitamin with minerals  1 tablet Oral Daily    Antimicrobials: Anti-infectives (From admission, onward)   None      PRN meds: HYDROmorphone (DILAUDID) injection,  loperamide, ondansetron (ZOFRAN) IV, oxyCODONE   Objective: Vitals:   02/29/20 0830 02/29/20 0845  BP: 99/67 100/64  Pulse: 74 71  Resp: 16 18  Temp:    SpO2: 100% 100%    Intake/Output Summary (Last 24 hours) at 02/29/2020 0951 Last data filed at 02/28/2020 1820 Gross per 24 hour  Intake 1050 ml  Output --  Net 1050 ml   Filed Weights   02/28/20 1416 02/28/20 1417  Weight: 35.5 kg 36.3 kg   Weight change:  Body mass index is 15.62 kg/m.   Physical Exam: General exam: Appears calm and comfortable.  Frustrated.  Not in physical distress Skin: No rashes, lesions or ulcers. HEENT: Atraumatic, normocephalic, supple neck, no obvious bleeding Lungs: Clear to auscultation bilaterally CVS: Regular rate and rhythm, no murmur GI/Abd soft, chronic mild diffuse abdominal tenderness, nondistended, bowel sounds present CNS: Alert, awake, oriented x3 Psychiatry: Initially had appropriate mood, but frustrated and upset towards the end Extremities: No pedal edema, no calf tenderness  Data Review: I have personally reviewed the laboratory data and studies available.  Recent Labs  Lab 02/28/20 1420  WBC 9.9  NEUTROABS 7.4  HGB 13.4  HCT 37.9  MCV 84.0  PLT 262   Recent Labs  Lab 02/28/20 1420 02/28/20 1600 02/28/20 2145 02/29/20 0617  NA 140  --  140 138  K <2.0*  --  2.5* 3.1*  CL 107  --  113* 111  CO2 21*  --  21* 21*  GLUCOSE 112*  --  131* 82  BUN <5*  --  <5* <5*  CREATININE 0.48  --  0.43* 0.47  CALCIUM 9.0  --  8.3* 8.7*  MG  --  1.9  --  2.0    Signed, Lorin Glass, MD [facility] Pager: [phone] (Secure Chat preferred). 02/29/2020

## 2020-04-26 ENCOUNTER — Other Ambulatory Visit: Payer: Self-pay | Admitting: Family Medicine

## 2020-04-26 DIAGNOSIS — E876 Hypokalemia: Secondary | ICD-10-CM

## 2020-04-26 MED ORDER — POTASSIUM CHLORIDE CRYS ER 20 MEQ PO TBCR: EXTENDED_RELEASE_TABLET | ORAL | 0 refills | Status: DC

## 2020-04-26 NOTE — Telephone Encounter (Signed)
Medication Refill - Medication: Potassium   Has the patient contacted their pharmacy? Yes.   Pt states that she is completely out of this medication. Please advise.  (Agent: If no, request that the patient contact the pharmacy for the refill.) (Agent: If yes, when and what did the pharmacy advise?)  Preferred Pharmacy (with phone number or street name):  [facility] [phone] - [place], [place] - [address] AT [facility] [address]  [address] [place] [address]  Phone: [phone] Fax: [phone]  Hours: Not open 24 hours     Agent: Please be advised that RX refills may take up to 3 business days. We ask that you follow-up with your pharmacy.

## 2020-05-31 IMAGING — CT CT ABDOMEN AND PELVIS WITH CONTRAST
2 of 5 series · 16 of 46 positions shown, 18 images · IV contrast (APPLIED)
Comparison: 06/27/2018

CLINICAL DATA: Abdominal and epigastric pain for 2 days, nausea,
vomiting

EXAM:
CT ABDOMEN AND PELVIS WITH CONTRAST
TECHNIQUE: Multidetector CT imaging of the abdomen and pelvis was performed
using the standard protocol following bolus administration of
intravenous contrast. Sagittal and coronal MPR images reconstructed
from axial data set.
CONTRAST:  80mL OMNIPAQUE IOHEXOL 300 MG/ML SOLN IV. Dilute oral
contrast.

[Series 2: axial st · axial · 0.61mm/px · z∈[-654,-294]mm · 13 of 84 slices shown, 15 images]
[im 6/84  soft-tissue]
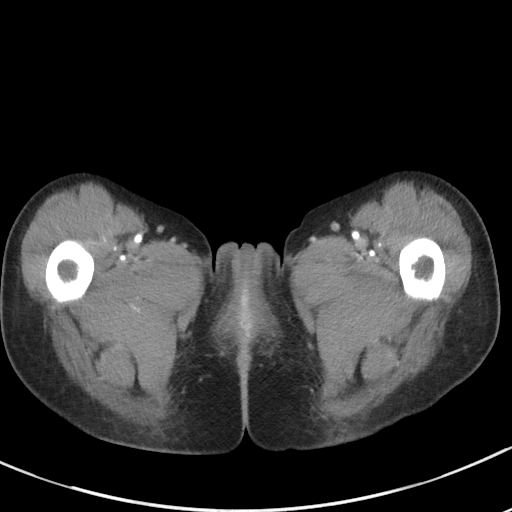
[im 6/84  bone]
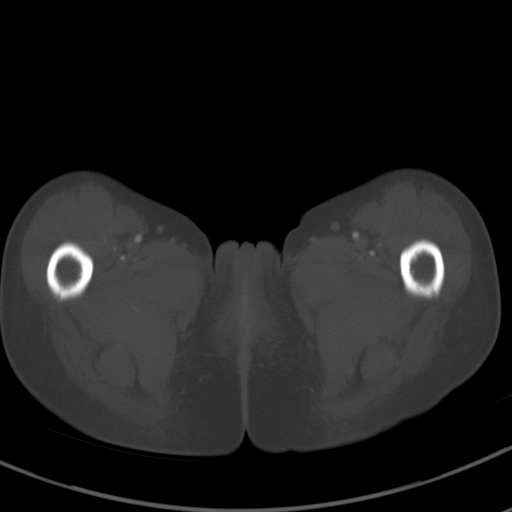
[im 11/84  soft-tissue]
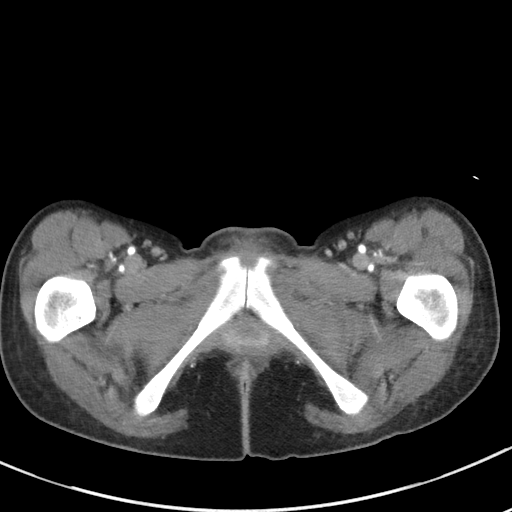
[im 16/84  soft-tissue]
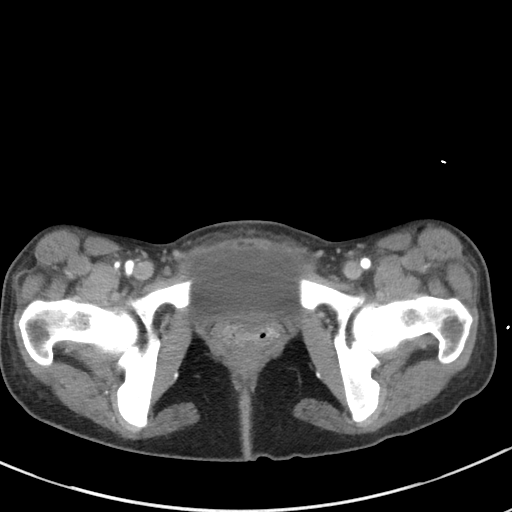
[im 26/84  soft-tissue]
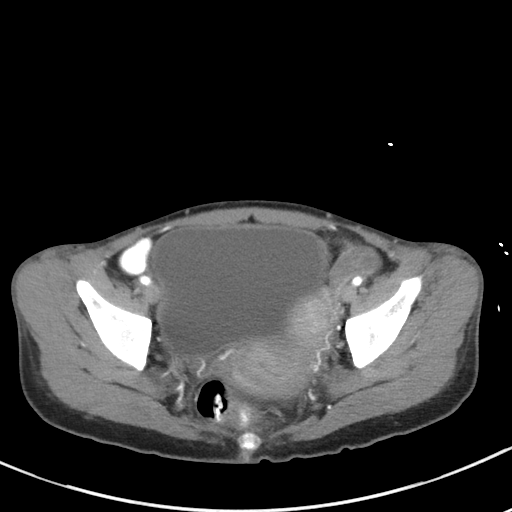
[im 32/84  soft-tissue]
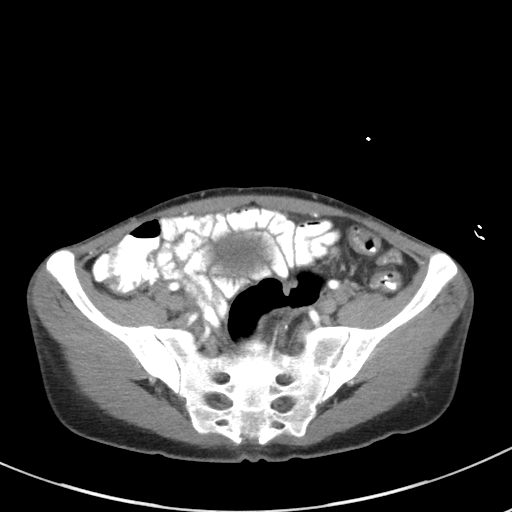
[im 37/84  soft-tissue]
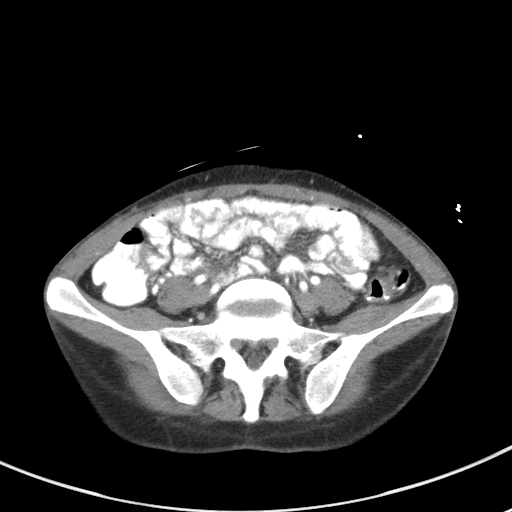
[im 42/84  soft-tissue]
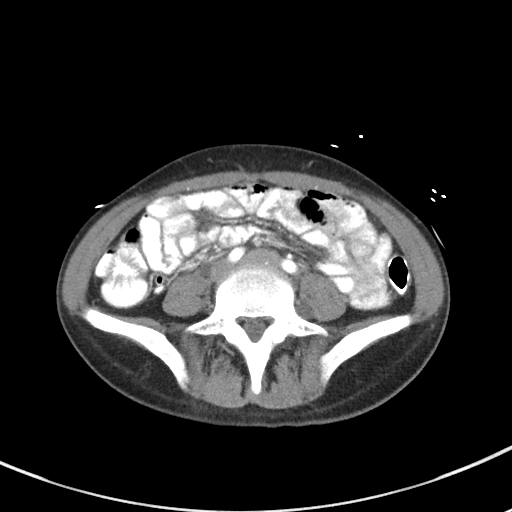
[im 47/84  soft-tissue]
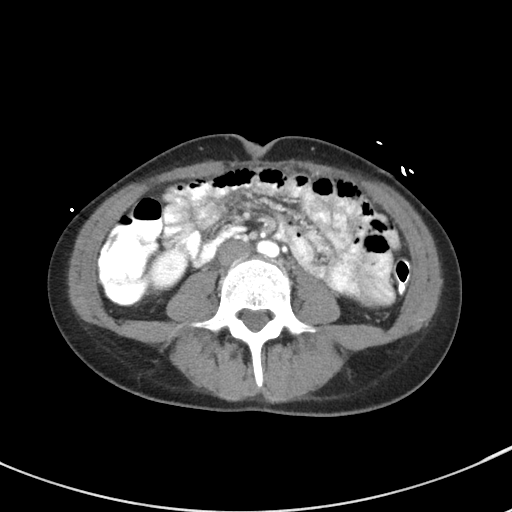
[im 52/84  soft-tissue]
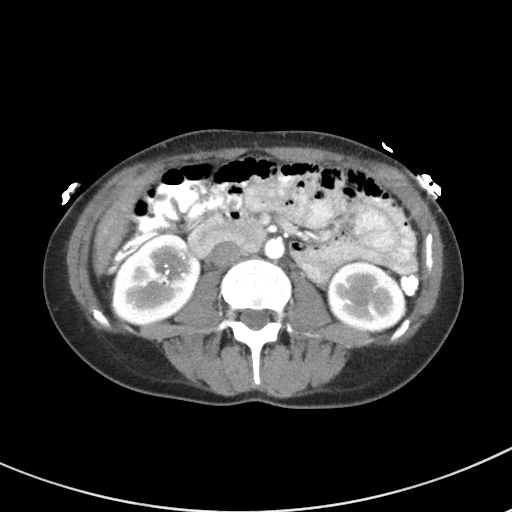
[im 52/84  bone]
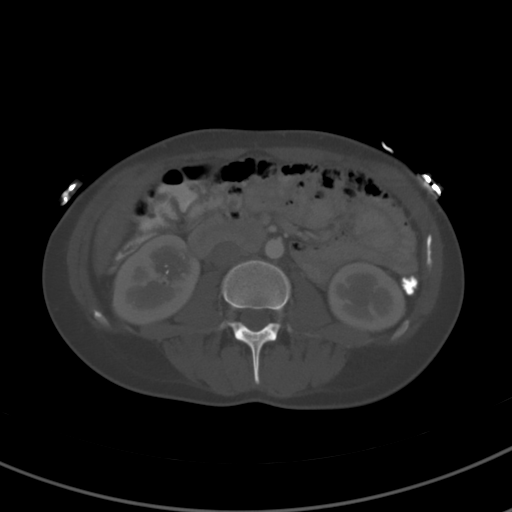
[im 58/84  soft-tissue]
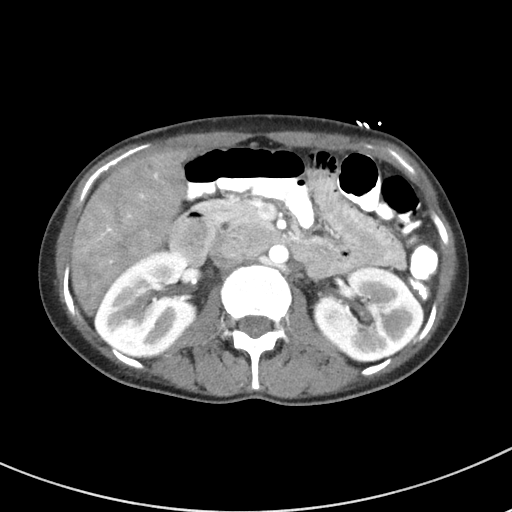
[im 68/84  soft-tissue]
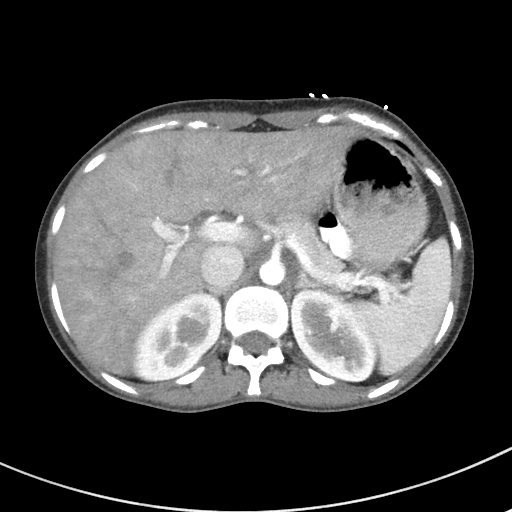
[im 73/84  soft-tissue]
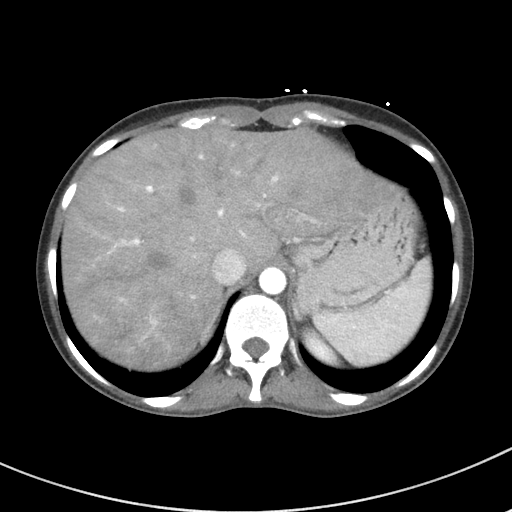
[im 78/84  soft-tissue]
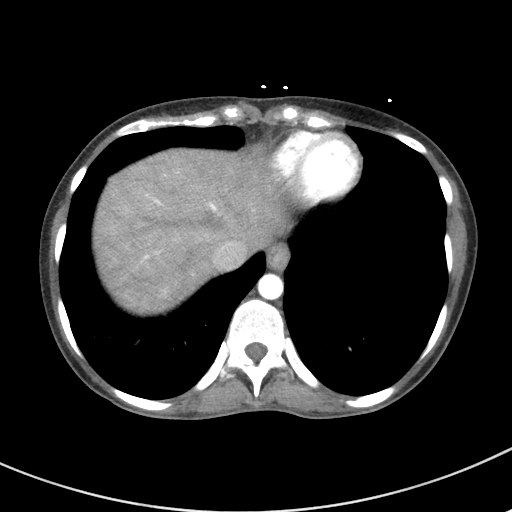

[Series 4: coronal st · coronal · 0.59mm/px · 3 of 77 slices shown]
[im 26/77  soft-tissue]
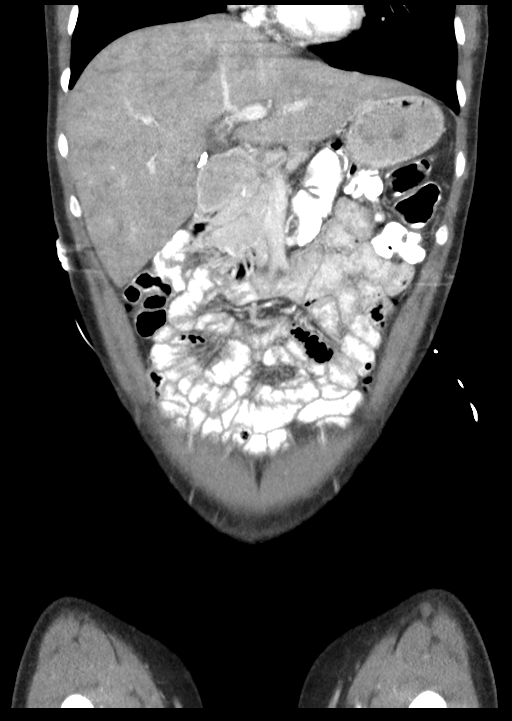
[im 34/77  soft-tissue]
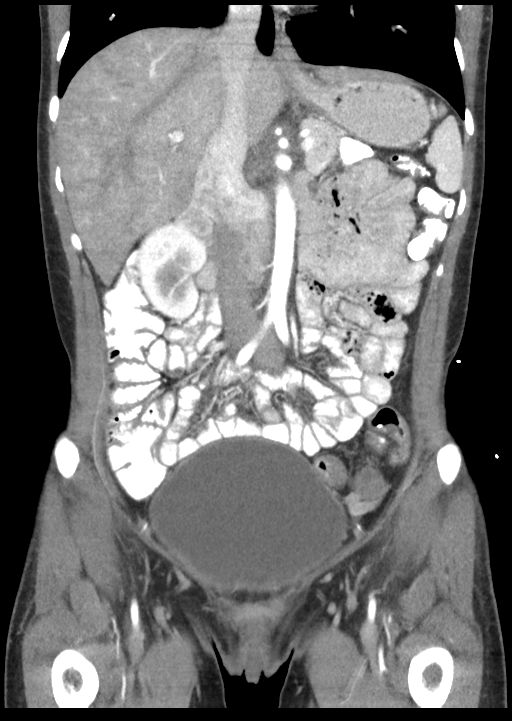
[im 43/77  soft-tissue]
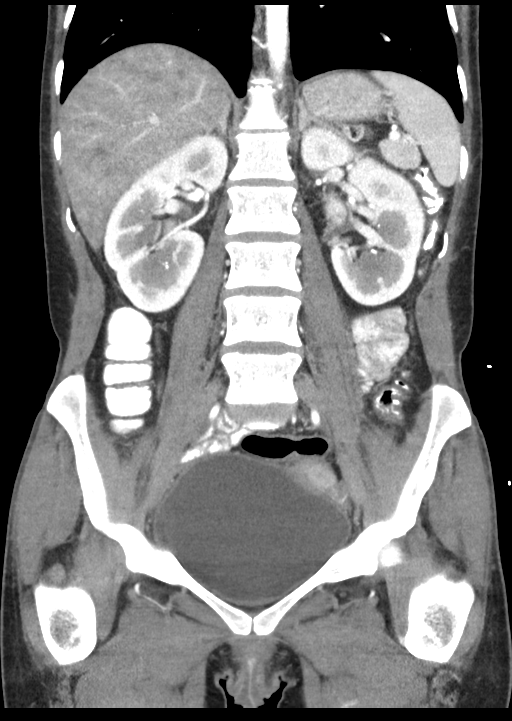

[16 of 46 positions shown; findings below may reference images not displayed]

FINDINGS: Lower chest: Lung bases clear

Hepatobiliary: Patchy enhancement of the liver likely related to
timing versus phase of enhancement, with unopacified hepatic veins
noted. No focal mass lesion of the liver identified. Post
cholecystectomy. No biliary dilatation.

Pancreas: Normal appearance

Spleen: Normal appearance. Small splenule inferior to spleen
posteriorly.

Adrenals/Urinary Tract: Adrenal glands normal appearance. Small
BILATERAL nonobstructing renal calculi, multiple. Small LEFT renal
cyst at mid inferior pole. No additional renal mass, hydronephrosis
or hydroureter. Bladder unremarkable.

Stomach/Bowel: Normal appendix. Stomach and bowel loops normal
appearance

Vascular/Lymphatic: Aorta normal caliber. No adenopathy. Retroaortic
LEFT renal vein.

Reproductive: Unremarkable uterus and adnexa

Other: No free air. Minimal free pelvic fluid in cul-de-sac. No
hernia.

Musculoskeletal: Unremarkable
IMPRESSION: BILATERAL nonobstructing renal calculi.

No definite acute intra-abdominal or intrapelvic abnormalities.

## 2021-02-13 ENCOUNTER — Emergency Department (HOSPITAL_COMMUNITY)
Admission: EM | Admit: 2021-02-13 | Discharge: 2021-02-13 | Disposition: A | Discharge status: Home / Self Care | Payer: Self-pay | Attending: Emergency Medicine | Admitting: Emergency Medicine

## 2021-02-13 ENCOUNTER — Encounter (HOSPITAL_COMMUNITY): Payer: Self-pay | Admitting: *Deleted

## 2021-02-13 ENCOUNTER — Emergency Department (HOSPITAL_COMMUNITY): Payer: Self-pay

## 2021-02-13 DIAGNOSIS — R197 Diarrhea, unspecified: Secondary | ICD-10-CM

## 2021-02-13 DIAGNOSIS — R109 Unspecified abdominal pain: Secondary | ICD-10-CM

## 2021-02-13 DIAGNOSIS — K9089 Other intestinal malabsorption: Secondary | ICD-10-CM

## 2021-02-13 DIAGNOSIS — E876 Hypokalemia: Secondary | ICD-10-CM

## 2021-02-13 DIAGNOSIS — Z20822 Contact with and (suspected) exposure to covid-19: Secondary | ICD-10-CM | POA: Insufficient documentation

## 2021-02-13 DIAGNOSIS — K909 Intestinal malabsorption, unspecified: Secondary | ICD-10-CM | POA: Insufficient documentation

## 2021-02-13 LAB — I-STAT BETA HCG BLOOD, ED (MC, WL, AP ONLY): I-stat hCG, quantitative: <5 m[IU]/mL (ref <5)

## 2021-02-13 LAB — URINALYSIS, ROUTINE W REFLEX MICROSCOPIC
Bacteria, UA: NONE SEEN
Bilirubin Urine: NEGATIVE
Glucose, UA: NEGATIVE mg/dL
Hgb urine dipstick: NEGATIVE
Ketones, ur: NEGATIVE mg/dL
Nitrite: NEGATIVE
Protein, ur: NEGATIVE mg/dL
Specific Gravity, Urine: 1.009 (ref 1.005–1.030)
pH: 7 (ref 5.0–8.0)

## 2021-02-13 LAB — COMPREHENSIVE METABOLIC PANEL
ALT: 20 U/L (ref 0–44)
AST: 20 U/L (ref 15–41)
Albumin: 4.4 g/dL (ref 3.5–5.0)
Alkaline Phosphatase: 46 U/L (ref 38–126)
Anion gap: 8 (ref 5–15)
BUN: 9 mg/dL (ref 6–20)
CO2: 25 mmol/L (ref 22–32)
Calcium: 9.1 mg/dL (ref 8.9–10.3)
Chloride: 105 mmol/L (ref 98–111)
Creatinine, Ser: 0.38 mg/dL — ABNORMAL LOW (ref 0.44–1.00)
GFR, Estimated: >60 mL/min (ref >60)
Glucose, Bld: 82 mg/dL (ref 70–99)
Potassium: 3.1 mmol/L — ABNORMAL LOW (ref 3.5–5.1)
Sodium: 138 mmol/L (ref 135–145)
Total Bilirubin: 1.5 mg/dL — ABNORMAL HIGH (ref 0.3–1.2)
Total Protein: 8 g/dL (ref 6.5–8.1)

## 2021-02-13 LAB — TSH: TSH: 0.717 u[IU]/mL (ref 0.350–4.500)

## 2021-02-13 LAB — CBC
HCT: 36.9 % (ref 36.0–46.0)
Hemoglobin: 12.9 g/dL (ref 12.0–15.0)
MCH: 29.3 pg (ref 26.0–34.0)
MCHC: 35 g/dL (ref 30.0–36.0)
MCV: 83.7 fL (ref 80.0–100.0)
Platelets: 223 K/uL (ref 150–400)
RBC: 4.41 MIL/uL (ref 3.87–5.11)
RDW: 14.9 % (ref 11.5–15.5)
WBC: 7.9 K/uL (ref 4.0–10.5)
nRBC: 0 % (ref 0.0–0.2)

## 2021-02-13 LAB — LIPASE, BLOOD: Lipase: 35 U/L (ref 11–51)

## 2021-02-13 LAB — HIV ANTIBODY (ROUTINE TESTING W REFLEX): HIV Screen 4th Generation wRfx: NONREACTIVE

## 2021-02-13 LAB — RESP PANEL BY RT-PCR (FLU A&B, COVID) ARPGX2
Influenza A by PCR: NEGATIVE
Influenza B by PCR: NEGATIVE
SARS Coronavirus 2 by RT PCR: NEGATIVE

## 2021-02-13 MED ORDER — DICYCLOMINE HCL 20 MG PO TABS: 20.0000 mg | ORAL_TABLET | ORAL | 0 refills | Status: DC

## 2021-02-13 MED ORDER — POTASSIUM CHLORIDE CRYS ER 20 MEQ PO TBCR: 40.0000 meq | EXTENDED_RELEASE_TABLET | Freq: Once | ORAL | Status: DC

## 2021-02-13 NOTE — ED Triage Notes (Signed)
Pt complains of nausea, diarrhea, weakness x 1 week. No emesis.

## 2021-02-13 NOTE — ED Provider Notes (Signed)
[facility]-EMERGENCY DEPT Provider Note   CSN: [id number] Arrival date & time: 02/13/21  1244     History Chief Complaint  Patient presents with   Abdominal Pain   Diarrhea    Sara Hill is a 30 y.o. female.   Abdominal Pain Associated symptoms: diarrhea   Associated symptoms: no fever   Diarrhea Associated symptoms: abdominal pain   Associated symptoms: no fever    Patient presents to the ED with complaints of not feeling well for at least the last week.  She has been having persistent issues with diarrhea.  She has been having pain in her abdomen.  She has been nauseated but not having any vomiting.  Patient is not having any blood in her stool but states her symptoms have been getting worse.  She felt very sick today so she came to the ER for evaluation.  Patient does have history of issues with recurrent diarrhea and hypokalemia.  Patient was admitted to the hospital in June of last year for a similar episode.  Patient also has history of chronic malabsorption and diarrhea since cholecystectomy in 2015  Past Medical History:  Diagnosis Date   Gall stones    Low blood potassium     Patient Active Problem List   Diagnosis Date Noted   Chronic malnutrition (HCC) 06/04/2019   Chronic diarrhea    Does not have health insurance    Acute hypokalemia 01/26/2019   Dehydration 11/01/2018   Abdominal pain 11/01/2018   Generalized weakness 11/01/2018   Nausea and vomiting    Protein-calorie malnutrition, severe 06/29/2018   Hypokalemia 06/27/2018    Past Surgical History:  Procedure Laterality Date   CHOLECYSTECTOMY     COLONOSCOPY  01/2018   normal   ESOPHAGOGASTRODUODENOSCOPY  01/2018   normal     OB History   No obstetric history on file.     Family History  Problem Relation Age of Onset   Healthy Mother    Diabetes Mother     Social History   Tobacco Use   Smoking status: Never   Smokeless tobacco: Never  Vaping Use    Vaping Use: Never used  Substance Use Topics   Alcohol use: No   Drug use: Yes    Types: Marijuana    Home Medications Prior to Admission medications   Medication Sig Start Date End Date Taking? Authorizing Provider  dicyclomine (BENTYL) 20 MG tablet Take 1 tablet (20 mg total) by mouth See admin instructions. Take 20 mg by mouth three times a day before meals as needed for spasms 02/13/21 03/15/21  Linwood Dibbles, MD  loperamide (IMODIUM) 2 MG capsule Take 1 capsule (2 mg total) by mouth every 4 (four) hours as needed for diarrhea or loose stools. 01/28/20   Arrien, York Ram, MD  Multiple Vitamin (MULTIVITAMIN) TABS Take 1 tablet by mouth daily. Patient not taking: Reported on 02/28/2020 07/06/19   Marcine Matar, MD  Pancrelipase, Lip-Prot-Amyl, [facility]) 20000-63000 units CPEP Take 1 capsule by mouth with breakfast, with lunch, and with evening meal. LF on 01-28-20 DS 33    [provider]  potassium chloride SA (KLOR-CON) 20 MEQ tablet Take 2 tabs PO Q a.m and 1 tab PO Q p.m 04/26/20   Marcine Matar, MD    Allergies    Benadryl [diphenhydramine hcl]  Review of Systems   Review of Systems  Constitutional:  Negative for fever.  Gastrointestinal:  Positive for abdominal pain and diarrhea.  All  other systems reviewed and are negative.  Physical Exam Updated Vital Signs BP (!) 104/57   Pulse 72   Temp 98.1 F (36.7 C) (Oral)   Resp 18   LMP 01/27/2021   SpO2 99%   Physical Exam Vitals and nursing note reviewed.  Constitutional:      Appearance: She is ill-appearing.     Comments: Underweight  HENT:     Head: Normocephalic and atraumatic.     Right Ear: External ear normal.     Left Ear: External ear normal.  Eyes:     General: No scleral icterus.       Right eye: No discharge.        Left eye: No discharge.     Conjunctiva/sclera: Conjunctivae normal.  Neck:     Trachea: No tracheal deviation.  Cardiovascular:     Rate and Rhythm: Normal rate and  regular rhythm.  Pulmonary:     Effort: Pulmonary effort is normal. No respiratory distress.     Breath sounds: Normal breath sounds. No stridor. No wheezing or rales.  Abdominal:     General: Bowel sounds are normal. There is no distension.     Palpations: Abdomen is soft.     Tenderness: There is generalized abdominal tenderness. There is no guarding or rebound.  Musculoskeletal:        General: No tenderness or deformity.     Cervical back: Neck supple.     Right lower leg: No edema.     Left lower leg: No edema.  Skin:    General: Skin is warm and dry.     Findings: No rash.  Neurological:     General: No focal deficit present.     Mental Status: She is alert.     Cranial Nerves: No cranial nerve deficit (no facial droop, extraocular movements intact, no slurred speech).     Sensory: No sensory deficit.     Motor: No abnormal muscle tone or seizure activity.     Coordination: Coordination normal.  Psychiatric:        Mood and Affect: Mood normal.    ED Results / Procedures / Treatments   Labs (all labs ordered are listed, but only abnormal results are displayed) Labs Reviewed  COMPREHENSIVE METABOLIC PANEL - Abnormal; Notable for the following components:      Result Value   Potassium 3.1 (*)    Creatinine, Ser 0.38 (*)    Total Bilirubin 1.5 (*)    All other components within normal limits  URINALYSIS, ROUTINE W REFLEX MICROSCOPIC - Abnormal; Notable for the following components:   Leukocytes,Ua TRACE (*)    All other components within normal limits  RESP PANEL BY RT-PCR (FLU A&B, COVID) ARPGX2  LIPASE, BLOOD  CBC  TSH  HIV ANTIBODY (ROUTINE TESTING W REFLEX)  I-STAT BETA HCG BLOOD, ED ([facility], [facility], [facility] ONLY)    EKG None  Radiology DG Abdomen Acute W/Chest  Result Date: 02/13/2021 CLINICAL DATA:  Nausea, diarrhea and weakness for 1 week. EXAM: DG ABDOMEN ACUTE WITH 1 VIEW CHEST COMPARISON:  Single-view of the chest 02/28/2020. CT abdomen and pelvis 11/02/2018 and  07/27/2015. FINDINGS: Single-view of the chest demonstrates clear lungs and normal heart size. No pneumothorax or pleural fluid. No acute or focal bony abnormality. Two views of the abdomen show no free intraperitoneal air. The bowel gas pattern is nonobstructive. Innumerable punctate calcifications projecting over both kidneys correlate with nonobstructing renal stones seen on prior CT scans. No evidence of  ureteral stone is identified. IMPRESSION: No acute finding chest or abdomen. Multiple punctate bilateral renal stones as seen on prior studies. Electronically Signed   By: Drusilla Kanner M.D.   On: 02/13/2021 15:11    Procedures Procedures   Medications Ordered in ED Medications - No data to display  ED Course  I have reviewed the triage vital signs and the nursing notes.  Pertinent labs & imaging results that were available during my care of the patient were reviewed by me and considered in my medical decision making (see chart for details).  Clinical Course as of 02/14/21 0853  Tue Feb 13, 2021  1545 CBC is normal.  COVID and flu are negative.  Lipase is normal.  Metabolic panel notable for mild hypokalemia [JK]  1545 Abdominal series without acute findings [JK]  1621 UA negative [JK]    Clinical Course User Index [JK] Linwood Dibbles, MD   MDM Rules/Calculators/A&P                          Pt presented to the ED with complaints of persistent diarrhea, abd cramping.  History of hypokalemia, malabsorption, chonic diarrhea.  Labs notable for mild hypokalemia.  Pt given po potassium, stated she had potassium available at home and did not require a prescription.  Improved during he ED visit.  Tolerating po, given a sandwhich and soda.  DOubt acute infection, obstrucion. Suspect exacerbation of her chronic gi issues. Final Clinical Impression(s) / ED Diagnoses Final diagnoses:  Diarrhea, unspecified type  Abdominal cramping  Hypokalemia  Other specified intestinal malabsorption    Rx  / DC Orders ED Discharge Orders          Ordered    dicyclomine (BENTYL) 20 MG tablet  See admin instructions        02/13/21 1711             Linwood Dibbles, MD 02/14/21 [phone]

## 2021-02-13 NOTE — ED Notes (Signed)
Pt walked to restroom without assistance and provided urine sample

## 2021-02-13 NOTE — Discharge Instructions (Addendum)
Continue your potassium medications that you have at home.  Take the Bentyl as needed for abdominal cramping.  Follow-up with your primary care doctor to be rechecked

## 2023-04-18 ENCOUNTER — Encounter (HOSPITAL_COMMUNITY): Payer: Self-pay | Admitting: Emergency Medicine

## 2023-04-18 ENCOUNTER — Observation Stay (HOSPITAL_COMMUNITY)
Admission: EM | Admit: 2023-04-18 | Discharge: 2023-04-19 | Disposition: A | Discharge status: Home / Self Care | Payer: Self-pay | Attending: Internal Medicine | Admitting: Internal Medicine

## 2023-04-18 ENCOUNTER — Other Ambulatory Visit: Payer: Self-pay

## 2023-04-18 DIAGNOSIS — R55 Syncope and collapse: Secondary | ICD-10-CM

## 2023-04-18 DIAGNOSIS — K9089 Other intestinal malabsorption: Secondary | ICD-10-CM

## 2023-04-18 DIAGNOSIS — R002 Palpitations: Secondary | ICD-10-CM

## 2023-04-18 DIAGNOSIS — E876 Hypokalemia: Principal | ICD-10-CM | POA: Insufficient documentation

## 2023-04-18 DIAGNOSIS — F410 Panic disorder [episodic paroxysmal anxiety] without agoraphobia: Secondary | ICD-10-CM

## 2023-04-18 DIAGNOSIS — Z79899 Other long term (current) drug therapy: Secondary | ICD-10-CM | POA: Insufficient documentation

## 2023-04-18 LAB — BASIC METABOLIC PANEL
Anion gap: 10 (ref 5–15)
Anion gap: 7 (ref 5–15)
BUN: 10 mg/dL (ref 6–20)
BUN: 8 mg/dL (ref 6–20)
CO2: 24 mmol/L (ref 22–32)
CO2: 24 mmol/L (ref 22–32)
Calcium: 9.5 mg/dL (ref 8.9–10.3)
Calcium: 8.4 mg/dL — ABNORMAL LOW (ref 8.9–10.3)
Chloride: 103 mmol/L (ref 98–111)
Chloride: 107 mmol/L (ref 98–111)
Creatinine, Ser: 0.45 mg/dL (ref 0.44–1.00)
Creatinine, Ser: 0.4 mg/dL — ABNORMAL LOW (ref 0.44–1.00)
GFR, Estimated: >60 mL/min (ref >60)
GFR, Estimated: >60 mL/min (ref >60)
Glucose, Bld: 100 mg/dL — ABNORMAL HIGH (ref 70–99)
Glucose, Bld: 104 mg/dL — ABNORMAL HIGH (ref 70–99)
Potassium: 2.3 mmol/L — CL (ref 3.5–5.1)
Potassium: 2.7 mmol/L — CL (ref 3.5–5.1)
Sodium: 137 mmol/L (ref 135–145)
Sodium: 138 mmol/L (ref 135–145)

## 2023-04-18 LAB — URINALYSIS, ROUTINE W REFLEX MICROSCOPIC
Bacteria, UA: NONE SEEN
Bilirubin Urine: NEGATIVE
Glucose, UA: NEGATIVE mg/dL
Ketones, ur: NEGATIVE mg/dL
Nitrite: NEGATIVE
Protein, ur: NEGATIVE mg/dL
Specific Gravity, Urine: 1.005 (ref 1.005–1.030)
pH: 8 (ref 5.0–8.0)

## 2023-04-18 LAB — CBC WITH DIFFERENTIAL/PLATELET
Abs Immature Granulocytes: 0.01 10*3/uL (ref 0.00–0.07)
Basophils Absolute: 0.1 10*3/uL (ref 0.0–0.1)
Basophils Relative: 1 %
Eosinophils Absolute: 0 10*3/uL (ref 0.0–0.5)
Eosinophils Relative: 0 %
HCT: 41.6 % (ref 36.0–46.0)
Hemoglobin: 14.6 g/dL (ref 12.0–15.0)
Immature Granulocytes: 0 %
Lymphocytes Relative: 22 %
Lymphs Abs: 1.5 10*3/uL (ref 0.7–4.0)
MCH: 29.3 pg (ref 26.0–34.0)
MCHC: 35.1 g/dL (ref 30.0–36.0)
MCV: 83.4 fL (ref 80.0–100.0)
Monocytes Absolute: 0.3 10*3/uL (ref 0.1–1.0)
Monocytes Relative: 5 %
Neutro Abs: 5 10*3/uL (ref 1.7–7.7)
Neutrophils Relative %: 72 %
Platelets: 273 10*3/uL (ref 150–400)
RBC: 4.99 MIL/uL (ref 3.87–5.11)
RDW: 14.9 % (ref 11.5–15.5)
WBC: 6.9 10*3/uL (ref 4.0–10.5)
nRBC: 0 % (ref 0.0–0.2)

## 2023-04-18 LAB — MAGNESIUM: Magnesium: 2 mg/dL (ref 1.7–2.4)

## 2023-04-18 LAB — PREGNANCY, URINE: Preg Test, Ur: NEGATIVE

## 2023-04-18 LAB — PHOSPHORUS: Phosphorus: 2.5 mg/dL (ref 2.5–4.6)

## 2023-04-18 MED ORDER — ALUM & MAG HYDROXIDE-SIMETH 200-200-20 MG/5ML PO SUSP
30.0000 mL | ORAL | Status: DC | PRN
  Administered 2023-04-18: 30 mL via ORAL
  Filled 2023-04-18: qty 30

## 2023-04-18 MED ORDER — MAGNESIUM SULFATE 2 GM/50ML IV SOLN
2.0000 g | Freq: Once | INTRAVENOUS | Status: AC
  Administered 2023-04-18: 2 g via INTRAVENOUS
  Filled 2023-04-18: qty 50

## 2023-04-18 MED ORDER — POTASSIUM CHLORIDE IN NACL 20-0.9 MEQ/L-% IV SOLN
INTRAVENOUS | Status: AC
  Filled 2023-04-18 (×2): qty 1000

## 2023-04-18 MED ORDER — ONDANSETRON HCL 4 MG/2ML IJ SOLN: 4.0000 mg | Freq: Four times a day (QID) | INTRAMUSCULAR | Status: DC | PRN

## 2023-04-18 MED ORDER — ONDANSETRON HCL 4 MG PO TABS
4.0000 mg | ORAL_TABLET | Freq: Four times a day (QID) | ORAL | Status: DC | PRN
  Administered 2023-04-18: 4 mg via ORAL
  Filled 2023-04-18: qty 1

## 2023-04-18 MED ORDER — POTASSIUM CHLORIDE 10 MEQ/100ML IV SOLN
10.0000 meq | INTRAVENOUS | Status: AC
  Administered 2023-04-18 (×4): 10 meq via INTRAVENOUS
  Filled 2023-04-18 (×5): qty 100

## 2023-04-18 MED ORDER — POTASSIUM CHLORIDE CRYS ER 20 MEQ PO TBCR
40.0000 meq | EXTENDED_RELEASE_TABLET | Freq: Once | ORAL | Status: AC
  Administered 2023-04-18: 40 meq via ORAL
  Filled 2023-04-18: qty 2

## 2023-04-18 MED ORDER — ACETAMINOPHEN 650 MG RE SUPP: 650.0000 mg | Freq: Four times a day (QID) | RECTAL | Status: DC | PRN

## 2023-04-18 MED ORDER — POTASSIUM CHLORIDE CRYS ER 20 MEQ PO TBCR
20.0000 meq | EXTENDED_RELEASE_TABLET | Freq: Once | ORAL | Status: AC
  Administered 2023-04-18: 20 meq via ORAL
  Filled 2023-04-18: qty 1

## 2023-04-18 MED ORDER — ENOXAPARIN SODIUM 30 MG/0.3ML IJ SOSY
30.0000 mg | PREFILLED_SYRINGE | INTRAMUSCULAR | Status: DC
  Filled 2023-04-18: qty 0.3

## 2023-04-18 MED ORDER — ACETAMINOPHEN 325 MG PO TABS
650.0000 mg | ORAL_TABLET | Freq: Four times a day (QID) | ORAL | Status: DC | PRN
  Administered 2023-04-18: 650 mg via ORAL
  Filled 2023-04-18: qty 2

## 2023-04-18 NOTE — ED Triage Notes (Signed)
Pt BIB [facility] with reports of her body "tensing up" and feeling anxious. Pt reports when this happens her potassium is "usually low."

## 2023-04-18 NOTE — Progress Notes (Signed)
   04/18/23 1054  TOC Brief Assessment  Insurance and Status Lapsed  Patient has primary care physician No  Home environment has been reviewed Alone  Prior level of function: independent  Prior/Current Home Services No current home services  Social Determinants of Health Reivew SDOH reviewed no interventions necessary  Readmission risk has been reviewed Yes  Transition of care needs no transition of care needs at this time

## 2023-04-18 NOTE — H&P (Signed)
History and Physical    Patient: Sara Hill JXB:[id number] DOB: [birth year] DOA: 04/18/2023 DOS: the patient was seen and examined on 04/18/2023 PCP: Arvilla Market, MD (Inactive)  Patient coming from: Home  Chief Complaint:  Chief Complaint  Patient presents with   Anxiety   HPI: Sara Hill is a 32 y.o. female with medical history significant of cholelithiasis, history of cholecystectomy, history of recurrent hypokalemia who is coming to the emergency department with a panic attack with palpitations and also with complaints of muscle spasms. He denied fever, chills, rhinorrhea, sore throat, wheezing or hemoptysis.  No chest pain, palpitations, diaphoresis, PND, orthopnea or pitting edema of the lower extremities.  No abdominal pain, nausea, emesis, diarrhea, constipation, melena or hematochezia.  No flank pain, dysuria, frequency or hematuria.  No polyuria, polydipsia, polyphagia or blurred vision.   Lab work: Her CBC was normal.  BMP showed a potassium of 2.3 mmol/L and a glucose of 100 mg/deciliter.  The rest of the BMP measurements were normal.  Unremarkable magnesium and phosphorus level.   ED course: Initial vital signs were temperature 98.0 F, pulse 85, respirations 15, BP 106/61 mmHg and O2 sat 99% on room air.  The patient received KCl 10 mEq IVPB x 4 doses.  Review of Systems: As mentioned in the history of present illness. All other systems reviewed and are negative. Past Medical History:  Diagnosis Date   Gall stones    Low blood potassium    Past Surgical History:  Procedure Laterality Date   CHOLECYSTECTOMY     COLONOSCOPY  01/2018   normal   ESOPHAGOGASTRODUODENOSCOPY  01/2018   normal   Social History:  reports that she has never smoked. She has never used smokeless tobacco. She reports current drug use. Drug: Marijuana. She reports that she does not drink alcohol.  Allergies  Allergen Reactions   Benadryl [Diphenhydramine Hcl] Itching,  Swelling and Other (See Comments)    Pt reports "feels like throat is swelling"     Family History  Problem Relation Age of Onset   Healthy Mother    Diabetes Mother     Prior to Admission medications   Medication Sig Start Date End Date Taking? Authorizing Provider  dicyclomine (BENTYL) 20 MG tablet Take 1 tablet (20 mg total) by mouth See admin instructions. Take 20 mg by mouth three times a day before meals as needed for spasms 02/13/21 03/15/21  Linwood Dibbles, MD  loperamide (IMODIUM) 2 MG capsule Take 1 capsule (2 mg total) by mouth every 4 (four) hours as needed for diarrhea or loose stools. 01/28/20   Arrien, York Ram, MD  Multiple Vitamin (MULTIVITAMIN) TABS Take 1 tablet by mouth daily. Patient not taking: Reported on 02/28/2020 07/06/19   Marcine Matar, MD  Pancrelipase, Lip-Prot-Amyl, (ZENPEP) 20000-63000 units CPEP Take 1 capsule by mouth with breakfast, with lunch, and with evening meal. LF on 01-28-20 DS 33    [provider]  potassium chloride SA (KLOR-CON) 20 MEQ tablet Take 2 tabs PO Q a.m and 1 tab PO Q p.m 04/26/20   Marcine Matar, MD    Physical Exam: Vitals:   04/18/23 0701  BP: 106/61  Pulse: 85  Resp: 15  Temp: 98 F (36.7 C)  SpO2: 99%   Physical Exam Vitals and nursing note reviewed.  Constitutional:      General: She is awake. She is not in acute distress.    Appearance: Normal appearance.  HENT:     Head: Normocephalic.  Nose: No rhinorrhea.     Mouth/Throat:     Mouth: Mucous membranes are moist.  Eyes:     General: No scleral icterus.    Pupils: Pupils are equal, round, and reactive to light.  Neck:     Vascular: No JVD.  Cardiovascular:     Rate and Rhythm: Normal rate and regular rhythm.     Heart sounds: S1 normal and S2 normal.  Pulmonary:     Effort: Pulmonary effort is normal.     Breath sounds: Normal breath sounds. No wheezing, rhonchi or rales.  Abdominal:     General: Bowel sounds are normal.      Palpations: Abdomen is soft.  Musculoskeletal:     Cervical back: Neck supple.     Right lower leg: No edema.     Left lower leg: No edema.  Skin:    General: Skin is warm and dry.  Neurological:     General: No focal deficit present.     Mental Status: She is alert and oriented to person, place, and time.  Psychiatric:        Mood and Affect: Mood normal.        Behavior: Behavior normal. Behavior is cooperative.    Data Reviewed:  Results are pending, will review when available.  Assessment and Plan: Principal Problem:   Acute hypokalemia Observation/telemetry. Continue oral and parenteral potassium supplementation. Magnesium sulfate 1 g IVPB given earlier. Follow potassium level in AM.  Active Problems:   Hypocalcemia Recheck calcium with albumin level in the morning.    Advance Care Planning:   Code Status: Full Code   Consults:   Family Communication:   Severity of Illness: The appropriate patient status for this patient is OBSERVATION. Observation status is judged to be reasonable and necessary in order to provide the required intensity of service to ensure the patient's safety. The patient's presenting symptoms, physical exam findings, and initial radiographic and laboratory data in the context of their medical condition is felt to place them at decreased risk for further clinical deterioration. Furthermore, it is anticipated that the patient will be medically stable for discharge from the hospital within 2 midnights of admission.   Author: Bobette Mo, MD 04/18/2023 9:03 AM  For on call review [email or web address].   This document was prepared using Dragon voice recognition software and may contain some unintended transcription errors.

## 2023-04-18 NOTE — ED Provider Notes (Signed)
[facility] Provider Note   CSN: [id number] Arrival date & time: 04/18/23  7829     History  Chief Complaint  Patient presents with   Anxiety    Sara Hill is a 32 y.o. female.  HPI    32 y.o. 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  According the patient she has generalized weakness and abdominal issues constantly.  However over the last 2 weeks or so her symptoms have worsened.  She indicates that she has had intermittent episodes of palpitations and has had near fainting spells with ambulation.  Her fianc actually indicated that she might have fainted at some point.  Today she went to work at 4 AM.  Later in the day, she suddenly started having weakness, dizziness and then she could not breathe.  She had a panic attack type situation and her colleagues called 911.   Home Medications Prior to Admission medications   Medication Sig Start Date End Date Taking? Authorizing Provider  dicyclomine (BENTYL) 20 MG tablet Take 1 tablet (20 mg total) by mouth See admin instructions. Take 20 mg by mouth three times a day before meals as needed for spasms 02/13/21 03/15/21  Linwood Dibbles, MD  loperamide (IMODIUM) 2 MG capsule Take 1 capsule (2 mg total) by mouth every 4 (four) hours as needed for diarrhea or loose stools. 01/28/20   Arrien, York Ram, MD  Multiple Vitamin (MULTIVITAMIN) TABS Take 1 tablet by mouth daily. Patient not taking: Reported on 02/28/2020 07/06/19   Marcine Matar, MD  Pancrelipase, Lip-Prot-Amyl, [facility]) 20000-63000 units CPEP Take 1 capsule by mouth with breakfast, with lunch, and with evening meal. LF on 01-28-20 DS 33    [provider]  potassium chloride SA (KLOR-CON) 20 MEQ tablet Take 2 tabs PO Q a.m and 1 tab PO Q p.m 04/26/20   Marcine Matar,  MD      Allergies    Benadryl [diphenhydramine hcl]    Review of Systems   Review of Systems  All other systems reviewed and are negative.   Physical Exam Updated Vital Signs BP (!) 89/61 (BP Location: Left Arm)   Pulse 71   Temp 98 F (36.7 C)   Resp 16   SpO2 100%  Physical Exam Vitals and nursing note reviewed.  Constitutional:      Appearance: She is well-developed. She is ill-appearing.     Comments: Patient cachectic  HENT:     Head: Atraumatic.  Cardiovascular:     Rate and Rhythm: Normal rate.  Pulmonary:     Effort: Pulmonary effort is normal.  Musculoskeletal:     Cervical back: Normal range of motion and neck supple.  Skin:    General: Skin is warm and dry.  Neurological:     Mental Status: She is alert and oriented to person, place, and time.     ED Results / Procedures / Treatments   Labs (all labs ordered are listed, but only abnormal results are displayed) Labs Reviewed  BASIC METABOLIC PANEL - Abnormal; Notable for the following components:      Result Value   Potassium 2.3 (*)    Glucose, Bld 100 (*)    All other components within normal limits  CBC WITH DIFFERENTIAL/PLATELET  MAGNESIUM  PHOSPHORUS  URINALYSIS, ROUTINE W REFLEX MICROSCOPIC  PREGNANCY, URINE  BASIC METABOLIC  PANEL    EKG EKG Interpretation Date/Time:  Friday April 18 2023 09:05:03 EDT Ventricular Rate:  64 PR Interval:  178 QRS Duration:  102 QT Interval:  421 QTC Calculation: 435 R Axis:   124  Text Interpretation: Sinus or ectopic atrial rhythm Posterior infarct, acute (LCx) Probable lateral infarct, age indeterminate No significant change since last tracing Confirmed by Derwood Kaplan [phone]) on 04/18/2023 11:00:23 AM  Radiology No results found.  Procedures .Critical Care  Performed by: Derwood Kaplan, MD Authorized by: Derwood Kaplan, MD   Critical care provider statement:    Critical care time (minutes):  41   Critical care was necessary to treat  or prevent imminent or life-threatening deterioration of the following conditions:  Metabolic crisis   Critical care was time spent personally by me on the following activities:  Development of treatment plan with patient or surrogate, discussions with consultants, evaluation of patient's response to treatment, examination of patient, ordering and review of laboratory studies, ordering and review of radiographic studies, ordering and performing treatments and interventions, pulse oximetry, re-evaluation of patient's condition and review of old charts     Medications Ordered in ED Medications  potassium chloride 10 mEq in 100 mL IVPB (10 mEq Intravenous New Bag/Given 04/18/23 0935)  magnesium sulfate IVPB 2 g 50 mL (2 g Intravenous New Bag/Given 04/18/23 1039)  enoxaparin (LOVENOX) injection 30 mg (has no administration in time range)  0.9 % NaCl with KCl 20 mEq/ L  infusion ( Intravenous New Bag/Given 04/18/23 1013)  acetaminophen (TYLENOL) tablet 650 mg (has no administration in time range)    Or  acetaminophen (TYLENOL) suppository 650 mg (has no administration in time range)  ondansetron (ZOFRAN) tablet 4 mg (has no administration in time range)    Or  ondansetron (ZOFRAN) injection 4 mg (has no administration in time range)  potassium chloride SA (KLOR-CON M) CR tablet 20 mEq (20 mEq Oral Given 04/18/23 1517)    ED Course/ Medical Decision Making/ A&P                                 Medical Decision Making Amount and/or Complexity of Data Reviewed Labs: ordered.  Risk Prescription drug management. Decision regarding hospitalization.   This patient presents to the ED with chief complaint(s) of worsening weakness, palpitations, dizziness with pertinent past medical history of mild absorption issues, chronic hypokalemia.The complaint involves an extensive differential diagnosis and also carries with it a high risk of complications and morbidity.    The differential diagnosis includes  : Acute hypokalemia, PACs, PVCs, VT, hypomagnesemia, severe anemia  The initial plan is to get basic labs, EKG.  Additional history obtained: Additional history obtained from EMS  Records reviewed previous admission documents  Independent labs interpretation:  The following labs were independently interpreted: Patient has hypokalemia with potassium less than 2.6.  Additionally patient has normal magnesium, no anemia.   Treatment and Reassessment: Patient has hypokalemia. I reviewed the EKG, it appears that she is having U waves in V1.  Potassium is 2.3.  IV potassium ordered, plan is to admit her to the hospital given near syncope, malaise, palpitations in the setting of severe hypokalemia.   Final Clinical Impression(s) / ED Diagnoses Final diagnoses:  Acute hypokalemia  Anxiety attack  Palpitations  Near syncope    Rx / DC Orders ED Discharge Orders     None  Derwood Kaplan, MD 04/18/23 1109

## 2023-04-18 NOTE — ED Notes (Signed)
ED TO INPATIENT HANDOFF REPORT  Name/Age/Gender Sara Hill 32 y.o. female  Code Status    Code Status Orders  (From admission, onward)           Start     Ordered   04/18/23 0907  Full code  Continuous       Question:  By:  Answer:  Consent: discussion documented in EHR   04/18/23 0908           Code Status History     Date Active Date Inactive Code Status Order ID Comments User Context   02/28/2020 1736 02/29/2020 1511 Full Code [id number]  Emeline General, MD ED   01/26/2020 1149 01/28/2020 1629 Full Code [id number]  Pennie Banter, DO ED   06/04/2019 1708 06/08/2019 1649 Full Code [id number]  Alwyn Ren, MD ED   01/26/2019 2323 01/27/2019 1852 Full Code [id number]  Meccariello, Solmon Ice, DO ED   11/01/2018 1822 11/02/2018 2221 Full Code [id number]  Rodolph Bong, MD Inpatient   06/27/2018 2048 06/29/2018 1858 Full Code [id number]  Enid Baas, MD Inpatient       Home/SNF/Other Home  Chief Complaint Acute hypokalemia [E87.6]  Level of Care/Admitting Diagnosis ED Disposition     ED Disposition  Admit   Condition  --   Comment  Hospital Area: [facility] [[id number]]  Level of Care: Telemetry [5]  Admit to tele based on following criteria: Monitor QTC interval  May place patient in observation at [facility] or [facility] if equivalent level of care is available:: No  Covid Evaluation: Asymptomatic - no recent exposure (last 10 days) testing not required  Diagnosis: Acute hypokalemia [474259]  Admitting Physician: Bobette Mo [[id number]]  Attending Physician: Bobette Mo [[id number]]          Medical History Past Medical History:  Diagnosis Date   Gall stones    Low blood potassium     Allergies Allergies  Allergen Reactions   Benadryl [Diphenhydramine Hcl] Itching, Swelling and Other (See Comments)    Pt reports "feels like throat is swelling"     IV Location/Drains/Wounds Patient  Lines/Drains/Airways Status     Active Line/Drains/Airways     Name Placement date Placement time Site Days   Peripheral IV 04/18/23 20 G Right Antecubital 04/18/23  0731  Antecubital  less than 1            Labs/Imaging Results for orders placed or performed during the hospital encounter of 04/18/23 (from the past 48 hour(s))  Basic metabolic panel     Status: Abnormal   Collection Time: 04/18/23  7:27 AM  Result Value Ref Range   Sodium 137 135 - 145 mmol/L   Potassium 2.3 (LL) 3.5 - 5.1 mmol/L    Comment: CRITICAL RESULT CALLED TO, READ BACK BY AND VERIFIED WITH Alver Fisher. RN AT [phone] ON 04/18/2023 BY MECIAL J.    Chloride 103 98 - 111 mmol/L   CO2 24 22 - 32 mmol/L   Glucose, Bld 100 (H) 70 - 99 mg/dL    Comment: Glucose reference range applies only to samples taken after fasting for at least 8 hours.   BUN 10 6 - 20 mg/dL   Creatinine, Ser 6.60 0.44 - 1.00 mg/dL   Calcium 9.5 8.9 - 63.0 mg/dL   GFR, Estimated >16 >01 mL/min    Comment: (NOTE) Calculated using the CKD-EPI Creatinine Equation (2021)    Anion gap 10 5 - 15  Comment: Performed at [facility], [address] [place], [place] [address]  CBC with Differential     Status: None   Collection Time: 04/18/23  7:27 AM  Result Value Ref Range   WBC 6.9 4.0 - 10.5 K/uL   RBC 4.99 3.87 - 5.11 MIL/uL   Hemoglobin 14.6 12.0 - 15.0 g/dL   HCT 57.8 46.9 - 62.9 %   MCV 83.4 80.0 - 100.0 fL   MCH 29.3 26.0 - 34.0 pg   MCHC 35.1 30.0 - 36.0 g/dL   RDW 52.8 41.3 - 24.4 %   Platelets 273 150 - 400 K/uL   nRBC 0.0 0.0 - 0.2 %   Neutrophils Relative % 72 %   Neutro Abs 5.0 1.7 - 7.7 K/uL   Lymphocytes Relative 22 %   Lymphs Abs 1.5 0.7 - 4.0 K/uL   Monocytes Relative 5 %   Monocytes Absolute 0.3 0.1 - 1.0 K/uL   Eosinophils Relative 0 %   Eosinophils Absolute 0.0 0.0 - 0.5 K/uL   Basophils Relative 1 %   Basophils Absolute 0.1 0.0 - 0.1 K/uL   Immature Granulocytes 0 %   Abs Immature  Granulocytes 0.01 0.00 - 0.07 K/uL    Comment: Performed at [facility], [address] [place], [place] [address]  Magnesium     Status: None   Collection Time: 04/18/23  7:27 AM  Result Value Ref Range   Magnesium 2.0 1.7 - 2.4 mg/dL    Comment: Performed at [facility], [address] [place], [place] [address]  Phosphorus     Status: None   Collection Time: 04/18/23  7:27 AM  Result Value Ref Range   Phosphorus 2.5 2.5 - 4.6 mg/dL    Comment: Performed at [facility], [address] [place], [place] [address]   No results found.  Pending Labs Unresulted Labs (From admission, onward)     Start     Ordered   04/19/23 0500  HIV Antibody (routine testing w rflx)  (HIV Antibody (Routine testing w reflex) panel)  Tomorrow morning,   R        04/18/23 0908   04/19/23 0500  Basic metabolic panel  Tomorrow morning,   R        04/18/23 0908   04/18/23 1400  Basic metabolic panel  Once-Timed,   TIMED        04/18/23 0908   04/18/23 0732  Urinalysis, Routine w reflex microscopic -Urine, Clean Catch  Once,   URGENT       Question:  Specimen Source  Answer:  Urine, Clean Catch   04/18/23 0731   04/18/23 0732  Pregnancy, urine  Once,   URGENT        04/18/23 0731            Vitals/Pain Today's Vitals   04/18/23 0701  BP: 106/61  Pulse: 85  Resp: 15  Temp: 98 F (36.7 C)  SpO2: 99%    Isolation Precautions No active isolations  Medications Medications  potassium chloride 10 mEq in 100 mL IVPB (has no administration in time range)  magnesium sulfate IVPB 2 g 50 mL (has no administration in time range)  enoxaparin (LOVENOX) injection 30 mg (has no administration in time range)  0.9 % NaCl with KCl 20 mEq/ L  infusion (has no administration in time range)  acetaminophen (TYLENOL) tablet 650 mg (has no administration in time range)  Or  acetaminophen (TYLENOL) suppository 650 mg (has no administration in time  range)  ondansetron (ZOFRAN) tablet 4 mg (has no administration in time range)    Or  ondansetron (ZOFRAN) injection 4 mg (has no administration in time range)  potassium chloride SA (KLOR-CON M) CR tablet 20 mEq (has no administration in time range)    Mobility walks with person assist   A&Ox4 vss

## 2023-04-19 DIAGNOSIS — E876 Hypokalemia: Secondary | ICD-10-CM

## 2023-04-19 LAB — BASIC METABOLIC PANEL
Anion gap: 6 (ref 5–15)
BUN: <5 mg/dL — ABNORMAL LOW (ref 6–20)
CO2: 21 mmol/L — ABNORMAL LOW (ref 22–32)
Calcium: 8.3 mg/dL — ABNORMAL LOW (ref 8.9–10.3)
Chloride: 113 mmol/L — ABNORMAL HIGH (ref 98–111)
Creatinine, Ser: 0.38 mg/dL — ABNORMAL LOW (ref 0.44–1.00)
GFR, Estimated: >60 mL/min (ref >60)
Glucose, Bld: 121 mg/dL — ABNORMAL HIGH (ref 70–99)
Potassium: 3.7 mmol/L (ref 3.5–5.1)
Sodium: 140 mmol/L (ref 135–145)

## 2023-04-19 LAB — HIV ANTIBODY (ROUTINE TESTING W REFLEX): HIV Screen 4th Generation wRfx: NONREACTIVE

## 2023-04-19 MED ORDER — POTASSIUM CHLORIDE CRYS ER 20 MEQ PO TBCR: 40.0000 meq | EXTENDED_RELEASE_TABLET | Freq: Two times a day (BID) | ORAL | 0 refills | Status: DC

## 2023-04-19 MED ORDER — LOPERAMIDE HCL 2 MG PO CAPS: 2.0000 mg | ORAL_CAPSULE | ORAL | 0 refills | Status: AC | PRN

## 2023-04-19 MED ORDER — POTASSIUM CHLORIDE CRYS ER 20 MEQ PO TBCR
20.0000 meq | EXTENDED_RELEASE_TABLET | Freq: Every day | ORAL | Status: DC
  Administered 2023-04-19: 20 meq via ORAL
  Filled 2023-04-19: qty 1

## 2023-04-19 MED ORDER — DICYCLOMINE HCL 20 MG PO TABS: 20.0000 mg | ORAL_TABLET | ORAL | 0 refills | Status: AC

## 2023-04-19 MED ORDER — CYCLOBENZAPRINE HCL 5 MG PO TABS
5.0000 mg | ORAL_TABLET | Freq: Once | ORAL | Status: AC
  Administered 2023-04-19: 5 mg via ORAL
  Filled 2023-04-19: qty 1

## 2023-04-19 NOTE — Discharge Summary (Signed)
Physician Discharge Summary  Sara Hill MWN:[id number] DOB: 20-Dec-1990 DOA: 04/18/2023  PCP: Arvilla Market, MD (Inactive)  Admit date: 04/18/2023 Discharge date: 04/19/2023  Admitted From: Home Disposition:  Home  Recommendations for Outpatient Follow-up:  Follow up with PCP in 1-2 weeks  Home Health:None  Equipment/Devices:None  Discharge Condition:Stable  CODE STATUS:Full  Diet recommendation: Regular diet    Brief/Interim Summary: Patient is a 32 y.o. female with well documented medical history significant for chronic diarrhea and associated electrolyte imbalance (hypokalemia most notably), as well as cholelithiasis s/p cholecystectomy, presents to the emergency department with a panic attack with palpitations and muscle pains/spasms. Patient admits non-adherence to potassium regimen. Once supplemented patient's symptoms resolved.  Otherwise stable and agreeable for discharge. Will call PCP office Monday to set up follow up as discussed. Medications refilled as below.  Discharge Diagnoses:  Principal Problem:   Acute hypokalemia Active Problems:   Hypocalcemia   Discharge Instructions  Discharge Instructions     Call MD for:  difficulty breathing, headache or visual disturbances   Complete by: As directed    Call MD for:  extreme fatigue   Complete by: As directed    Call MD for:  persistant dizziness or light-headedness   Complete by: As directed    Call MD for:  persistant nausea and vomiting   Complete by: As directed    Diet general   Complete by: As directed    Discharge instructions   Complete by: As directed    Continue increased potassium for 10 days - repeat labs with primary care physician for adjustment (may need to reduce dose back to prior 2 tablet am and 1 tablet pm pending those labs)   Increase activity slowly   Complete by: As directed       Allergies as of 04/19/2023       Reactions   Benadryl [diphenhydramine Hcl] Itching,  Swelling, Other (See Comments)   Pt reports "feels like throat is swelling"        Medication List     STOP taking these medications    Multivitamin Tabs       TAKE these medications    dicyclomine 20 MG tablet Commonly known as: Bentyl Take 1 tablet (20 mg total) by mouth See admin instructions. Take 20 mg by mouth three times a day before meals as needed for spasms   loperamide 2 MG capsule Commonly known as: IMODIUM Take 1 capsule (2 mg total) by mouth every 4 (four) hours as needed for diarrhea or loose stools.   potassium chloride SA 20 MEQ tablet Commonly known as: KLOR-CON M Take 2 tablets (40 mEq total) by mouth 2 (two) times daily. Take 2 tabs PO Q a.m and 1 tab PO Q p.m What changed:  how much to take how to take this when to take this        Allergies  Allergen Reactions   Benadryl [Diphenhydramine Hcl] Itching, Swelling and Other (See Comments)    Pt reports "feels like throat is swelling"     Consultations: None   Procedures/Studies: No results found.   Subjective: No acute issues/events overnight   Discharge Exam: Vitals:   04/18/23 2215 04/19/23 0424  BP: (!) 93/54 (!) 93/58  Pulse: 73 68  Resp: 18 16  Temp: 98.1 F (36.7 C) 98.7 F (37.1 C)  SpO2: 100% 100%   Vitals:   04/18/23 1453 04/18/23 1815 04/18/23 2215 04/19/23 0424  BP: 100/66 (!) 93/58 (!) 93/54 (!) 93/58  Pulse: 67 67 73 68  Resp: 18  18 16   Temp: 98.3 F (36.8 C) 98.4 F (36.9 C) 98.1 F (36.7 C) 98.7 F (37.1 C)  TempSrc:  Oral Oral Oral  SpO2: 100% 100% 100% 100%    General: Pt is alert, awake, not in acute distress Cardiovascular: RRR, S1/S2 +, no rubs, no gallops Respiratory: CTA bilaterally, no wheezing, no rhonchi Abdominal: Soft, NT, ND, bowel sounds + Extremities: no edema, no cyanosis    The results of significant diagnostics from this hospitalization (including imaging, microbiology, ancillary and laboratory) are listed below for reference.      Microbiology: No results found for this or any previous visit (from the past 240 hour(s)).   Labs: BNP (last 3 results) No results for input(s): "BNP" in the last 8760 hours. Basic Metabolic Panel: Recent Labs  Lab 04/18/23 0727 04/18/23 1351 04/19/23 0514  NA 137 138 140  K 2.3* 2.7* 3.7  CL 103 107 113*  CO2 24 24 21*  GLUCOSE 100* 104* 121*  BUN 10 8 <5*  CREATININE 0.45 0.40* 0.38*  CALCIUM 9.5 8.4* 8.3*  MG 2.0  --   --   PHOS 2.5  --   --    Liver Function Tests: No results for input(s): "AST", "ALT", "ALKPHOS", "BILITOT", "PROT", "ALBUMIN" in the last 168 hours. No results for input(s): "LIPASE", "AMYLASE" in the last 168 hours. No results for input(s): "AMMONIA" in the last 168 hours. CBC: Recent Labs  Lab 04/18/23 0727  WBC 6.9  NEUTROABS 5.0  HGB 14.6  HCT 41.6  MCV 83.4  PLT 273   Cardiac Enzymes: No results for input(s): "CKTOTAL", "CKMB", "CKMBINDEX", "TROPONINI" in the last 168 hours. BNP: Invalid input(s): "POCBNP" CBG: No results for input(s): "GLUCAP" in the last 168 hours. D-Dimer No results for input(s): "DDIMER" in the last 72 hours. Hgb A1c No results for input(s): "HGBA1C" in the last 72 hours. Lipid Profile No results for input(s): "CHOL", "HDL", "LDLCALC", "TRIG", "CHOLHDL", "LDLDIRECT" in the last 72 hours. Thyroid function studies No results for input(s): "TSH", "T4TOTAL", "T3FREE", "THYROIDAB" in the last 72 hours.  Invalid input(s): "FREET3" Anemia work up No results for input(s): "VITAMINB12", "FOLATE", "FERRITIN", "TIBC", "IRON", "RETICCTPCT" in the last 72 hours. Urinalysis    Component Value Date/Time   COLORURINE STRAW (A) 04/18/2023 1520   APPEARANCEUR CLEAR 04/18/2023 1520   LABSPEC 1.005 04/18/2023 1520   PHURINE 8.0 04/18/2023 1520   GLUCOSEU NEGATIVE 04/18/2023 1520   HGBUR MODERATE (A) 04/18/2023 1520   BILIRUBINUR NEGATIVE 04/18/2023 1520   KETONESUR NEGATIVE 04/18/2023 1520   PROTEINUR NEGATIVE  04/18/2023 1520   UROBILINOGEN 1.0 11/26/2014 1435   NITRITE NEGATIVE 04/18/2023 1520   LEUKOCYTESUR SMALL (A) 04/18/2023 1520   Sepsis Labs Recent Labs  Lab 04/18/23 0727  WBC 6.9   Microbiology No results found for this or any previous visit (from the past 240 hour(s)).   Time coordinating discharge: Over 30 minutes  SIGNED:   Azucena Fallen, DO [facility] 04/19/2023, 11:36 AM Pager   If 7PM-7AM, please contact night-coverage [email or web address]

## 2023-04-21 ENCOUNTER — Other Ambulatory Visit: Payer: Self-pay

## 2023-04-21 ENCOUNTER — Telehealth: Payer: Self-pay

## 2023-04-21 ENCOUNTER — Other Ambulatory Visit: Payer: Self-pay | Admitting: Internal Medicine

## 2023-04-21 MED ORDER — POTASSIUM CHLORIDE CRYS ER 20 MEQ PO TBCR
40.0000 meq | EXTENDED_RELEASE_TABLET | Freq: Two times a day (BID) | ORAL | 0 refills | Status: AC
  Filled 2023-04-21: qty 120, 30d supply, fill #0

## 2023-04-21 NOTE — Transitions of Care (Post Inpatient/ED Visit) (Signed)
   04/21/2023  Name: Kiyonna Backer MRN: [id number] DOB: [birth year]  Today's TOC FU Call Status: Today's TOC FU Call Status:: Unsuccessful Call (1st Attempt) Unsuccessful Call (1st Attempt) Date: 04/21/23  Attempted to reach the patient regarding the most recent Inpatient/ED visit.  Follow Up Plan: Additional outreach attempts will be made to reach the patient to complete the Transitions of Care (Post Inpatient/ED visit) call.    Dr Marcy Siren is listed as PCP but she is no longer PCP at [facility] Signature Robyne Peers, RN

## 2023-04-22 ENCOUNTER — Telehealth: Payer: Self-pay

## 2023-04-22 NOTE — Transitions of Care (Post Inpatient/ED Visit) (Signed)
   04/22/2023  Name: Sara Hill MRN: [id number] DOB: 1991-01-27  Today's TOC FU Call Status: Today's TOC FU Call Status:: Unsuccessful Call (2nd Attempt) Unsuccessful Call (1st Attempt) Date: 04/21/23 Unsuccessful Call (2nd Attempt) Date: 04/22/23  Attempted to reach the patient regarding the most recent Inpatient/ED visit.  Follow Up Plan: Additional outreach attempts will be made to reach the patient to complete the Transitions of Care (Post Inpatient/ED visit) call.   Signature  Robyne Peers, RN

## 2023-04-23 ENCOUNTER — Telehealth: Payer: Self-pay

## 2023-04-23 NOTE — Transitions of Care (Post Inpatient/ED Visit) (Signed)
   04/23/2023  Name: Sara Hill MRN: [id number] DOB: [birth year]  Today's TOC FU Call Status: Today's TOC FU Call Status:: Successful TOC FU Call Completed Unsuccessful Call (1st Attempt) Date: 04/21/23 Unsuccessful Call (2nd Attempt) Date: 04/22/23 [facility] FU Call Complete Date: 04/23/23 Patient's Name and Date of Birth confirmed.  Transition Care Management Follow-up Telephone Call Date of Discharge: 04/19/23 Discharge Facility: [facility]) Type of Discharge: Inpatient Admission Primary Inpatient Discharge Diagnosis:: acute hypokalemia How have you been since you were released from the hospital?: Better Any questions or concerns?: Yes Patient Questions/Concerns:: She reports she is still experienceing diarrhea. She has been taking imodium.  She stated that she needs to call her PCP to schedule an appointment.  Dr Earlene Plater was listed as PCP but she is no longer at [facility] and the patient had not seen her since 2021.  The patient said she has a new PCP but could not remember her exact name.  She said " [place]" but did not have an address for her.  I encouraged her to call her PCP today to schedule her follow up and she said she would.  She is also aware of when she needs to return to ED. Patient Questions/Concerns Addressed: Other: (Patient to call her PCP)  Items Reviewed: Did you receive and understand the discharge instructions provided?: Yes Medications obtained,verified, and reconciled?: Yes (Medications Reviewed) (She has the imodium and potassium chloride and she correctly stated how she is to take the potassium.) Any new allergies since your discharge?: No Dietary orders reviewed?: No Do you have support at home?: Yes People in Home: alone Name of Support/Comfort Primary Source: She did not confirm who would be able to provide assistance if needed.  Medications Reviewed Today: Medications Reviewed Today     Reviewed by Robyne Peers, RN (Case Manager) on 04/23/23 at 1040   Med List Status: <None>   Medication Order Taking? Sig Documenting Provider Last Dose Status Informant  dicyclomine (BENTYL) 20 MG tablet [id number]  Take 1 tablet (20 mg total) by mouth See admin instructions. Take 20 mg by mouth three times a day before meals as needed for spasms Azucena Fallen, MD  Active   loperamide (IMODIUM) 2 MG capsule [id number]  Take 1 capsule (2 mg total) by mouth every 4 (four) hours as needed for diarrhea or loose stools. Azucena Fallen, MD  Active   potassium chloride SA (KLOR-CON M) 20 MEQ tablet [id number]  Take 2 tablets (40 mEq total) by mouth 2 (two) times daily. Azucena Fallen, MD  Active             Home Care and Equipment/Supplies: Were Home Health Services Ordered?: No Any new equipment or medical supplies ordered?: No  Functional Questionnaire: Do you need assistance with bathing/showering or dressing?: No Do you need assistance with meal preparation?: No Do you need assistance with eating?: No Do you have difficulty maintaining continence: No Do you need assistance with getting out of bed/getting out of a chair/moving?: No Do you have difficulty managing or taking your medications?: No  Follow up appointments reviewed: PCP Follow-up appointment confirmed?: No MD Provider Line [phone] Given: No (patient said she will call her PCP) [facility] Follow-up appointment confirmed?: NA Do you need transportation to your follow-up appointment?: No Do you understand care options if your condition(s) worsen?: Yes-patient verbalized understanding    SIGNATURE Robyne Peers, RN

## 2023-04-25 ENCOUNTER — Other Ambulatory Visit: Payer: Self-pay

## 2023-08-26 ENCOUNTER — Other Ambulatory Visit (HOSPITAL_COMMUNITY): Payer: Self-pay
